# Patient Record
Sex: Female | Born: 1950 | Race: White | Hispanic: No | Smoking: Former smoker
Health system: Southern US, Community
[De-identification: ages and names within clinical notes are randomized; demographics above are authoritative.]

## PROBLEM LIST (undated history)

## (undated) DIAGNOSIS — I4819 Other persistent atrial fibrillation: Secondary | ICD-10-CM

## (undated) DIAGNOSIS — I5042 Chronic combined systolic (congestive) and diastolic (congestive) heart failure: Secondary | ICD-10-CM

## (undated) DIAGNOSIS — I10 Essential (primary) hypertension: Secondary | ICD-10-CM

## (undated) DIAGNOSIS — J449 Chronic obstructive pulmonary disease, unspecified: Secondary | ICD-10-CM

## (undated) DIAGNOSIS — E782 Mixed hyperlipidemia: Secondary | ICD-10-CM

## (undated) DIAGNOSIS — I071 Rheumatic tricuspid insufficiency: Secondary | ICD-10-CM

## (undated) DIAGNOSIS — Z9289 Personal history of other medical treatment: Secondary | ICD-10-CM

## (undated) DIAGNOSIS — I513 Intracardiac thrombosis, not elsewhere classified: Secondary | ICD-10-CM

## (undated) DIAGNOSIS — K219 Gastro-esophageal reflux disease without esophagitis: Secondary | ICD-10-CM

## (undated) DIAGNOSIS — I340 Nonrheumatic mitral (valve) insufficiency: Secondary | ICD-10-CM

## (undated) DIAGNOSIS — Z720 Tobacco use: Secondary | ICD-10-CM

## (undated) HISTORY — DX: Other persistent atrial fibrillation: I48.19

## (undated) HISTORY — DX: Rheumatic tricuspid insufficiency: I07.1

## (undated) HISTORY — DX: Chronic combined systolic (congestive) and diastolic (congestive) heart failure: I50.42

## (undated) HISTORY — DX: Nonrheumatic mitral (valve) insufficiency: I34.0

## (undated) HISTORY — DX: Intracardiac thrombosis, not elsewhere classified: I51.3

## (undated) HISTORY — DX: Personal history of other medical treatment: Z92.89

---

## 2004-05-03 ENCOUNTER — Ambulatory Visit: Payer: Self-pay | Admitting: Family Medicine

## 2006-02-07 ENCOUNTER — Ambulatory Visit: Payer: Self-pay | Admitting: Family Medicine

## 2006-03-10 ENCOUNTER — Ambulatory Visit: Payer: Self-pay | Admitting: Family Medicine

## 2007-10-07 ENCOUNTER — Ambulatory Visit: Payer: Self-pay | Admitting: Family Medicine

## 2008-06-09 ENCOUNTER — Ambulatory Visit: Payer: Self-pay | Admitting: Family Medicine

## 2012-12-28 ENCOUNTER — Ambulatory Visit: Payer: Self-pay | Admitting: Gastroenterology

## 2017-12-15 ENCOUNTER — Other Ambulatory Visit: Payer: Self-pay | Admitting: Family Medicine

## 2017-12-15 DIAGNOSIS — Z139 Encounter for screening, unspecified: Secondary | ICD-10-CM

## 2017-12-22 ENCOUNTER — Other Ambulatory Visit: Payer: Self-pay

## 2020-07-21 ENCOUNTER — Other Ambulatory Visit: Payer: Self-pay | Admitting: Family Medicine

## 2020-07-21 DIAGNOSIS — Z1231 Encounter for screening mammogram for malignant neoplasm of breast: Secondary | ICD-10-CM

## 2020-09-21 ENCOUNTER — Inpatient Hospital Stay
Admission: EM | Admit: 2020-09-21 | Discharge: 2020-09-26 | DRG: 308 | Disposition: A | Discharge status: Home / Self Care | Payer: Medicare HMO | Attending: Internal Medicine | Admitting: Internal Medicine

## 2020-09-21 ENCOUNTER — Emergency Department: Payer: Medicare HMO

## 2020-09-21 ENCOUNTER — Other Ambulatory Visit: Payer: Self-pay

## 2020-09-21 DIAGNOSIS — K219 Gastro-esophageal reflux disease without esophagitis: Secondary | ICD-10-CM | POA: Diagnosis present

## 2020-09-21 DIAGNOSIS — F419 Anxiety disorder, unspecified: Secondary | ICD-10-CM | POA: Diagnosis present

## 2020-09-21 DIAGNOSIS — I361 Nonrheumatic tricuspid (valve) insufficiency: Secondary | ICD-10-CM | POA: Diagnosis not present

## 2020-09-21 DIAGNOSIS — I340 Nonrheumatic mitral (valve) insufficiency: Secondary | ICD-10-CM | POA: Diagnosis not present

## 2020-09-21 DIAGNOSIS — E785 Hyperlipidemia, unspecified: Secondary | ICD-10-CM | POA: Diagnosis not present

## 2020-09-21 DIAGNOSIS — I4819 Other persistent atrial fibrillation: Secondary | ICD-10-CM | POA: Diagnosis not present

## 2020-09-21 DIAGNOSIS — J441 Chronic obstructive pulmonary disease with (acute) exacerbation: Secondary | ICD-10-CM | POA: Diagnosis present

## 2020-09-21 DIAGNOSIS — Z885 Allergy status to narcotic agent status: Secondary | ICD-10-CM | POA: Diagnosis not present

## 2020-09-21 DIAGNOSIS — J9620 Acute and chronic respiratory failure, unspecified whether with hypoxia or hypercapnia: Secondary | ICD-10-CM | POA: Diagnosis present

## 2020-09-21 DIAGNOSIS — I513 Intracardiac thrombosis, not elsewhere classified: Secondary | ICD-10-CM | POA: Diagnosis present

## 2020-09-21 DIAGNOSIS — I5031 Acute diastolic (congestive) heart failure: Secondary | ICD-10-CM | POA: Diagnosis present

## 2020-09-21 DIAGNOSIS — F17210 Nicotine dependence, cigarettes, uncomplicated: Secondary | ICD-10-CM | POA: Diagnosis present

## 2020-09-21 DIAGNOSIS — Z79899 Other long term (current) drug therapy: Secondary | ICD-10-CM

## 2020-09-21 DIAGNOSIS — R531 Weakness: Secondary | ICD-10-CM | POA: Diagnosis present

## 2020-09-21 DIAGNOSIS — F32A Depression, unspecified: Secondary | ICD-10-CM | POA: Diagnosis present

## 2020-09-21 DIAGNOSIS — Z20822 Contact with and (suspected) exposure to covid-19: Secondary | ICD-10-CM | POA: Diagnosis present

## 2020-09-21 DIAGNOSIS — I10 Essential (primary) hypertension: Secondary | ICD-10-CM | POA: Diagnosis not present

## 2020-09-21 DIAGNOSIS — I4891 Unspecified atrial fibrillation: Principal | ICD-10-CM | POA: Diagnosis present

## 2020-09-21 DIAGNOSIS — E782 Mixed hyperlipidemia: Secondary | ICD-10-CM | POA: Diagnosis present

## 2020-09-21 DIAGNOSIS — I110 Hypertensive heart disease with heart failure: Secondary | ICD-10-CM | POA: Diagnosis present

## 2020-09-21 HISTORY — DX: Gastro-esophageal reflux disease without esophagitis: K21.9

## 2020-09-21 HISTORY — DX: Essential (primary) hypertension: I10

## 2020-09-21 HISTORY — DX: Tobacco use: Z72.0

## 2020-09-21 HISTORY — DX: Chronic obstructive pulmonary disease, unspecified: J44.9

## 2020-09-21 HISTORY — DX: Mixed hyperlipidemia: E78.2

## 2020-09-21 LAB — COMPREHENSIVE METABOLIC PANEL
ALT: 43 U/L (ref 0–44)
AST: 58 U/L — ABNORMAL HIGH (ref 15–41)
Albumin: 3.7 g/dL (ref 3.5–5.0)
Alkaline Phosphatase: 74 U/L (ref 38–126)
Anion gap: 7 (ref 5–15)
BUN: 9 mg/dL (ref 8–23)
CO2: 26 mmol/L (ref 22–32)
Calcium: 8.1 mg/dL — ABNORMAL LOW (ref 8.9–10.3)
Chloride: 103 mmol/L (ref 98–111)
Creatinine, Ser: 0.67 mg/dL (ref 0.44–1.00)
GFR, Estimated: >60 mL/min (ref >60)
Glucose, Bld: 147 mg/dL — ABNORMAL HIGH (ref 70–99)
Potassium: 3.7 mmol/L (ref 3.5–5.1)
Sodium: 136 mmol/L (ref 135–145)
Total Bilirubin: 0.9 mg/dL (ref 0.3–1.2)
Total Protein: 6.3 g/dL — ABNORMAL LOW (ref 6.5–8.1)

## 2020-09-21 LAB — CBC
HCT: 38.5 % (ref 36.0–46.0)
Hemoglobin: 13.2 g/dL (ref 12.0–15.0)
MCH: 34.4 pg — ABNORMAL HIGH (ref 26.0–34.0)
MCHC: 34.3 g/dL (ref 30.0–36.0)
MCV: 100.3 fL — ABNORMAL HIGH (ref 80.0–100.0)
Platelets: 197 10*3/uL (ref 150–400)
RBC: 3.84 MIL/uL — ABNORMAL LOW (ref 3.87–5.11)
RDW: 12.4 % (ref 11.5–15.5)
WBC: 5.5 10*3/uL (ref 4.0–10.5)
nRBC: 0 % (ref 0.0–0.2)

## 2020-09-21 LAB — RESP PANEL BY RT-PCR (FLU A&B, COVID) ARPGX2
Influenza A by PCR: NEGATIVE
Influenza B by PCR: NEGATIVE
SARS Coronavirus 2 by RT PCR: NEGATIVE

## 2020-09-21 LAB — TROPONIN I (HIGH SENSITIVITY)
Troponin I (High Sensitivity): 11 ng/L (ref <18)
Troponin I (High Sensitivity): 12 ng/L (ref <18)

## 2020-09-21 LAB — BRAIN NATRIURETIC PEPTIDE: B Natriuretic Peptide: 547.2 pg/mL — ABNORMAL HIGH (ref 0.0–100.0)

## 2020-09-21 LAB — MAGNESIUM: Magnesium: 1.9 mg/dL (ref 1.7–2.4)

## 2020-09-21 MED ORDER — DILTIAZEM HCL-DEXTROSE 125-5 MG/125ML-% IV SOLN (PREMIX)
5.0000 mg/h | INTRAVENOUS | Status: DC
  Administered 2020-09-21: 5 mg/h via INTRAVENOUS
  Filled 2020-09-21: qty 125

## 2020-09-21 MED ORDER — METHYLPREDNISOLONE SODIUM SUCC 40 MG IJ SOLR
40.0000 mg | Freq: Two times a day (BID) | INTRAMUSCULAR | Status: DC
  Administered 2020-09-22 – 2020-09-25 (×7): 40 mg via INTRAVENOUS
  Filled 2020-09-21 (×7): qty 1

## 2020-09-21 MED ORDER — METHYLPREDNISOLONE SODIUM SUCC 125 MG IJ SOLR
125.0000 mg | Freq: Once | INTRAMUSCULAR | Status: AC
  Administered 2020-09-21: 125 mg via INTRAVENOUS
  Filled 2020-09-21: qty 2

## 2020-09-21 MED ORDER — APIXABAN 5 MG PO TABS
5.0000 mg | ORAL_TABLET | Freq: Two times a day (BID) | ORAL | Status: DC
  Administered 2020-09-21: 5 mg via ORAL
  Filled 2020-09-21: qty 1

## 2020-09-21 MED ORDER — FLUTICASONE FUROATE-VILANTEROL 200-25 MCG/INH IN AEPB
1.0000 | INHALATION_SPRAY | Freq: Every day | RESPIRATORY_TRACT | Status: DC
  Administered 2020-09-22 – 2020-09-26 (×5): 1 via RESPIRATORY_TRACT
  Filled 2020-09-21 (×2): qty 28

## 2020-09-21 MED ORDER — DILTIAZEM HCL 25 MG/5ML IV SOLN
10.0000 mg | Freq: Once | INTRAVENOUS | Status: AC
  Administered 2020-09-21: 10 mg via INTRAVENOUS
  Filled 2020-09-21: qty 5

## 2020-09-21 MED ORDER — VITAMIN E 180 MG (400 UNIT) PO CAPS
400.0000 [IU] | ORAL_CAPSULE | Freq: Two times a day (BID) | ORAL | Status: DC
  Administered 2020-09-22 – 2020-09-26 (×10): 400 [IU] via ORAL
  Filled 2020-09-21: qty 1
  Filled 2020-09-21: qty 4
  Filled 2020-09-21 (×7): qty 1
  Filled 2020-09-21: qty 4
  Filled 2020-09-21 (×2): qty 1

## 2020-09-21 MED ORDER — IPRATROPIUM-ALBUTEROL 0.5-2.5 (3) MG/3ML IN SOLN
3.0000 mL | Freq: Once | RESPIRATORY_TRACT | Status: AC
  Administered 2020-09-21: 3 mL via RESPIRATORY_TRACT
  Filled 2020-09-21: qty 3

## 2020-09-21 MED ORDER — HEPARIN BOLUS VIA INFUSION
2600.0000 [IU] | Freq: Once | INTRAVENOUS | Status: AC
  Administered 2020-09-22: 2600 [IU] via INTRAVENOUS
  Filled 2020-09-21: qty 2600

## 2020-09-21 MED ORDER — PANTOPRAZOLE SODIUM 40 MG PO TBEC
40.0000 mg | DELAYED_RELEASE_TABLET | Freq: Every day | ORAL | Status: DC
  Administered 2020-09-22 – 2020-09-26 (×5): 40 mg via ORAL
  Filled 2020-09-21 (×5): qty 1

## 2020-09-21 MED ORDER — ATORVASTATIN CALCIUM 20 MG PO TABS
20.0000 mg | ORAL_TABLET | Freq: Every day | ORAL | Status: DC
  Administered 2020-09-22 – 2020-09-25 (×5): 20 mg via ORAL
  Filled 2020-09-21 (×6): qty 1

## 2020-09-21 MED ORDER — HEPARIN (PORCINE) 25000 UT/250ML-% IV SOLN
850.0000 [IU]/h | INTRAVENOUS | Status: DC
  Administered 2020-09-22: 750 [IU]/h via INTRAVENOUS
  Filled 2020-09-21: qty 250

## 2020-09-21 MED ORDER — FUROSEMIDE 10 MG/ML IJ SOLN
40.0000 mg | Freq: Once | INTRAMUSCULAR | Status: AC
  Administered 2020-09-21: 40 mg via INTRAVENOUS
  Filled 2020-09-21: qty 4

## 2020-09-21 MED ORDER — ASCORBIC ACID 500 MG PO TABS
1000.0000 mg | ORAL_TABLET | Freq: Two times a day (BID) | ORAL | Status: DC
  Administered 2020-09-22 – 2020-09-26 (×10): 1000 mg via ORAL
  Filled 2020-09-21 (×10): qty 2

## 2020-09-21 MED ORDER — ADULT MULTIVITAMIN W/MINERALS CH
1.0000 | ORAL_TABLET | Freq: Every day | ORAL | Status: DC
  Administered 2020-09-22 – 2020-09-26 (×5): 1 via ORAL
  Filled 2020-09-21 (×5): qty 1

## 2020-09-21 MED ORDER — ACETAMINOPHEN 325 MG PO TABS
650.0000 mg | ORAL_TABLET | ORAL | Status: DC | PRN
  Administered 2020-09-22: 650 mg via ORAL
  Filled 2020-09-21: qty 2

## 2020-09-21 MED ORDER — FLUOXETINE HCL 20 MG PO CAPS
40.0000 mg | ORAL_CAPSULE | Freq: Every day | ORAL | Status: DC
  Administered 2020-09-22 – 2020-09-26 (×5): 40 mg via ORAL
  Filled 2020-09-21 (×5): qty 2

## 2020-09-21 MED ORDER — AMIODARONE HCL IN DEXTROSE 360-4.14 MG/200ML-% IV SOLN
60.0000 mg/h | INTRAVENOUS | Status: DC
Start: 2020-09-21 — End: 2020-09-22
  Administered 2020-09-22 (×2): 60 mg/h via INTRAVENOUS
  Filled 2020-09-21 (×2): qty 200

## 2020-09-21 MED ORDER — SODIUM CHLORIDE 0.9 % IV SOLN
500.0000 mg | Freq: Every day | INTRAVENOUS | Status: AC
  Administered 2020-09-21 – 2020-09-23 (×3): 500 mg via INTRAVENOUS
  Filled 2020-09-21 (×3): qty 500

## 2020-09-21 MED ORDER — AMIODARONE LOAD VIA INFUSION
150.0000 mg | Freq: Once | INTRAVENOUS | Status: AC
  Administered 2020-09-22: 150 mg via INTRAVENOUS
  Filled 2020-09-21: qty 83.34

## 2020-09-21 MED ORDER — IPRATROPIUM BROMIDE 0.02 % IN SOLN: 0.5000 mg | RESPIRATORY_TRACT | Status: DC | PRN

## 2020-09-21 MED ORDER — IPRATROPIUM BROMIDE 0.02 % IN SOLN
0.5000 mg | Freq: Four times a day (QID) | RESPIRATORY_TRACT | Status: DC
  Administered 2020-09-21 – 2020-09-25 (×15): 0.5 mg via RESPIRATORY_TRACT
  Filled 2020-09-21 (×15): qty 2.5

## 2020-09-21 MED ORDER — AMIODARONE HCL IN DEXTROSE 360-4.14 MG/200ML-% IV SOLN
30.0000 mg/h | INTRAVENOUS | Status: DC
Start: 2020-09-22 — End: 2020-09-25
  Administered 2020-09-22 – 2020-09-24 (×6): 30 mg/h via INTRAVENOUS
  Filled 2020-09-21 (×7): qty 200

## 2020-09-21 MED ORDER — ONDANSETRON HCL 4 MG/2ML IJ SOLN: 4.0000 mg | Freq: Four times a day (QID) | INTRAMUSCULAR | Status: DC | PRN

## 2020-09-21 NOTE — Discharge Instructions (Signed)
Information on my medicine - ELIQUIS (apixaban) This medication education was reviewed with me or my healthcare representative as part of my discharge preparation. The pharmacist that spoke with me during my hospital stay was: ____________________________ (pharmacist name) WHY WAS ELIQUIS PRESCRIBED FOR YOU? Eliquis was prescribed for you to reduce the risk of a blood clot forming that can cause a stroke if you have a medical condition called atrial fibrillation (a type of irregular heartbeat). WHAT DO YOU NEED TO KNOW ABOUT ELIQUIS ? Take your Eliquis TWICE DAILY - one tablet in the morning and one tablet in the evening with or without food. If you have difficulty swallowing the tablet whole please discuss with your pharmacist how to take the medication safely. Take Eliquis exactly as prescribed by your doctor and DO NOT stop taking Eliquis without talking to the doctor who prescribed the medication. Stopping may increase your risk of developing a stroke. Refill your prescription before you run out. After discharge, you should have regular check-up appointments with your healthcare provider that is prescribing your Eliquis. In the future your dose may need to be changed if your kidney function or weight changes by a significant amount or as you get older. WHAT DO YOU DO IF YOU MISS A DOSE? If you miss a dose, take it as soon as you remember on the same day and resume taking twice daily. Do not take more than one dose of ELIQUIS at the same time to make up a missed dose. IMPORTANT SAFETY INFORMATION A possible side effect of Eliquis is bleeding. You should call your healthcare provider right away if you experience any of the following: ? Bleeding from an injury or your nose that does not stop. ? Unusual colored urine (red or dark brown) or unusual colored stools (red or black). ? Unusual bruising for unknown reasons. ? A serious fall or if you hit your head (even if there is no  bleeding). Some medicines may interact with Eliquis and might increase your risk of bleeding or clotting while on Eliquis. To help avoid this, consult your healthcare provider or pharmacist prior to using any new prescription or non-prescription medications, including herbals, vitamins, non-steroidal anti-inflammatory drugs (NSAIDs) and supplements. This website has more information on Eliquis (apixaban): www.Eliquis.com. 

## 2020-09-21 NOTE — ED Triage Notes (Signed)
Pt in with co [facility] for a week, when EMS arrived she was HR 208, given Adenocard 6 mg with 12 mg without improvement. Given metoprolol 2.5 and now AFib heart rate 150's.

## 2020-09-21 NOTE — H&P (Signed)
History and Physical    Angela Singh XNA:[id number] DOB: 04/20/1950 DOA: 09/21/2020  PCP: Oswaldo Conroy, MD  Chief Complaint: Shortness of breath  HPI: Angela Singh is a 70 y.o. 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  The patient presents to the emergency department due to shortness of breath for the past 1 week that has been getting worse.  Associated wheezing and purulent cough with white sputum.  Endorses some dyspnea on exertion and some orthopnea.  Today her heart started racing therefore she called EMS.  No known history of CHF.  Upon EMS arrival her heart rate was in the 200s.  There was concern for SVT and the patient was given adenosine and metoprolol.  Heart rate did improve.  EKG in the emergency department showed A. fib with RVR.  The patient was given a diltiazem bolus and then started on a diltiazem drip.  On my exam her heart rate is still in the 130s to 150s.  Maxed out on the diltiazem drip.  Denies any chest pain.  Denies any previous history of A. fib.  ED Course: 640 mg IV x1, DuoNebs 3 mL x 1, Solu-Medrol 125 mg IV x1, diltiazem 10 mg IV x1, diltiazem drip.  EKG, COVID 19 PCR, flu A/B PCR, CBC, magnesium, CMP, troponin, BNP.  Review of Systems: 14 point review of systems is negative except for what is mentioned above in the HPI.   No past medical history on file.    Social History   Socioeconomic History   Marital status: Divorced    Spouse name: Not on file   Number of children: Not on file   Years of education: Not on file   Highest education level: Not on file  Occupational History   Not on file  Tobacco Use   Smoking status: Not on file   Smokeless tobacco: Not on file  Substance and Sexual Activity   Alcohol use: Not on file   Drug use: Not on file   Sexual activity: Not on file  Other Topics Concern   Not on file  Social History Narrative   Not  on file   Social Determinants of Health   Financial Resource Strain: Not on file  Food Insecurity: Not on file  Transportation Needs: Not on file  Physical Activity: Not on file  Stress: Not on file  Social Connections: Not on file  Intimate Partner Violence: Not on file    Allergies  Allergen Reactions   Codeine Other (See Comments)    weakness    No family history on file.  Prior to Admission medications   Not on File    Physical Exam: Vitals:   09/21/20 2042 09/21/20 2100 09/21/20 2140 09/21/20 2202  BP:  (!) 110/58 107/76   Pulse:  (!) 128 (!) 113   Resp:  (!) 27 18   Temp:    98.9 F (37.2 C)  TempSrc:    Oral  SpO2:  96% 97%   Weight: 52.2 kg     Height: 5\' 4"  (1.626 m)        General:  Appears calm and comfortable and is in NAD Cardiovascular: Irregular rhythm and irregular rate. Respiratory: Diffuse expiratory wheeze Abdomen:  soft, NT, ND, NABS Skin:  no rash or induration seen on limited exam Musculoskeletal:  grossly normal tone BUE/BLE, good ROM, no bony abnormality Lower extremity:  No LE edema.  Limited foot exam with no ulcerations.  2+ distal pulses. Psychiatric:  grossly normal mood and affect, speech fluent and appropriate, AOx3 Neurologic:  CN 2-12 grossly intact, moves all extremities in coordinated fashion, sensation intact    Radiological Exams on Admission: Independently reviewed - see discussion in A/P where applicable  DG Chest 2 View  Result Date: 09/21/2020 CLINICAL DATA:  Chest pain and shortness of breath. New onset atrial fibrillation and RVR. EXAM: CHEST - 2 VIEW COMPARISON:  Chest x-ray 02/07/2006 FINDINGS: The heart and mediastinal contours are unchanged. Aortic calcification. No focal consolidation. No pulmonary edema. Increased and coarsened interstitial markings. Bilateral trace pleural effusion. No pneumothorax. No acute osseous abnormality. IMPRESSION: Mild pulmonary edema with trace bilateral pleural effusions.  Electronically Signed   By: Tish Frederickson M.D.   On: 09/21/2020 21:35    EKG: Independently reviewed.  Atrial fibrillation, rate 151.   Labs on Admission: I have personally reviewed the available labs and imaging studies at the time of the admission.  Pertinent labs: BNP 547 blood glucose 147, AST 58, troponin 11.     Assessment/Plan: New onset atrial fibrillation with rapid ventricular response: The patient will be admitted to the stepdown unit under inpatient status with cardiac monitoring.  Currently on diltiazem drip and maxed out on the drip.  Has not converted to sinus rhythm.  Remains in RVR. Start heparin drip atrial fib dosing for stroke prophylaxis.  We will obtain echocardiogram to rule out valvular abnormalities.  Cardiology consulted and recommended amiodarone bolus and then drip.  Electrolytes are reassuring.  We will obtain a TSH/free T4.  Troponins are reassuring.  Acute dyspnea secondary to COPD exacerbation and suspected mild CHF decompensation: To pathology appears to be primarily COPD related as she is wheezing on exam.  May have some imposed mild pulmonary edema.  Has been given 640 mg IV x1 in the emergency department.  We will hold off on any further IV diuresis.  Start DuoNeb scheduled and as needed.  Continue IV steroids.  ICS, flutter valve, chest PT.  Start Zithromax.  Currently on room air and saturating well.  Continue home Symbicort.  We will obtain echocardiogram to assess LVEF.  Hypertension: Patient's home HCTZ 12.5 mg daily due to low normal blood pressures.  Tobacco dependence: Counseled on smoking cessation.  Hyperlipidemia: Continue home atorvastatin 20 mg daily.  Depression/anxiety: Continue home Prozac 40 mg daily.  GERD: Continue home Protonix 40 mg daily.   Level of Care: Stepdown DVT prophylaxis: Heparin drip Code Status: Full code Consults: Cardiology Admission status: Inpatient   Verdia Kuba DO [facility]   How to contact  the [facility] Attending or Consulting provider 7A - 7P or covering provider during after hours 7P -7A, for this patient?  Check the care team in [facility] and look for a) attending/consulting [facility] provider listed and b) the [facility] team listed Log into [email or web address] and use [place]'s universal password to access. If you do not have the password, please contact the hospital operator. Locate the [facility] provider you are looking for under [facility] and page to a number that you can be directly reached. If you still have difficulty reaching the provider, please page the [facility] (Director on Call) for the Hospitalists listed on amion for assistance.   09/21/2020, 10:13 PM

## 2020-09-21 NOTE — ED Provider Notes (Signed)
[facility] Emergency Department Provider Note ____________________________________________   Event Date/Time   First MD Initiated Contact with Patient 09/21/20 2040     (approximate)  I have reviewed the triage vital signs and the nursing notes.  HISTORY  Chief Complaint Shortness of Breath   HPI Angela Singh is a 70 y.o. femalewho presents to the ED for evaluation of shortness of breath and tachycardia.  Chart review indicates no relevant history within our system. Patient with a history of hypertension, COPD and GERD.  She has no idea of the medications that she is on.  She presents today for evaluation of worsening intermittent shortness of breath with exertion over the past week.  She reports increased cough without increased sputum production.  She denies any fevers.  She reports orthopnea and dyspnea on exertion.  She denies chest pain, syncope or palpitations.  Denies pain.  This has never happened before.  No past medical history on file.  There are no problems to display for this patient.    Prior to Admission medications   Not on File    Allergies Codeine  No family history on file.  Social History    Review of Systems  Constitutional: No fever/chills Eyes: No visual changes. ENT: No sore throat. Cardiovascular: Denies chest pain. Respiratory: Positive for shortness of breath. Gastrointestinal: No abdominal pain.  No nausea, no vomiting.  No diarrhea.  No constipation. Genitourinary: Negative for dysuria. Musculoskeletal: Negative for back pain. Skin: Negative for rash. Neurological: Negative for headaches, focal weakness or numbness.  ____________________________________________   PHYSICAL EXAM:  VITAL SIGNS: Vitals:   09/21/20 2100 09/21/20 2140  BP: (!) 110/58 107/76  Pulse: (!) 128 (!) 113  Resp: (!) 27 18  SpO2: 96% 97%     Constitutional: Alert and oriented. Well appearing and in no acute distress.  Pink  puffer morphology. Eyes: Conjunctivae are normal. PERRL. EOMI. Head: Atraumatic. Nose: No congestion/rhinnorhea. Mouth/Throat: Mucous membranes are dry.  Oropharynx non-erythematous. Neck: No stridor. No cervical spine tenderness to palpation. Cardiovascular: Tachycardic and irregular rhythm. Grossly normal heart sounds.  Good peripheral circulation. Respiratory: Slight tachypnea to the mid 20s, no further evidence of distress.  Bibasilar crackles are present as well as diffuse expiratory wheezes with slight decrease in air movement throughout. Gastrointestinal: Soft , nondistended, nontender to palpation. No CVA tenderness. Musculoskeletal: No lower extremity tenderness nor edema.  No joint effusions. No signs of acute trauma. Neurologic:  Normal speech and language. No gross focal neurologic deficits are appreciated. No gait instability noted. Skin:  Skin is warm, dry and intact. No rash noted. Psychiatric: Mood and affect are normal. Speech and behavior are normal.  ____________________________________________   LABS (all labs ordered are listed, but only abnormal results are displayed)  Labs Reviewed  CBC - Abnormal; Notable for the following components:      Result Value   RBC 3.84 (*)    MCV 100.3 (*)    MCH 34.4 (*)    All other components within normal limits  COMPREHENSIVE METABOLIC PANEL - Abnormal; Notable for the following components:   Glucose, Bld 147 (*)    Calcium 8.1 (*)    Total Protein 6.3 (*)    AST 58 (*)    All other components within normal limits  BRAIN NATRIURETIC PEPTIDE - Abnormal; Notable for the following components:   B Natriuretic Peptide 547.2 (*)    All other components within normal limits  RESP PANEL BY RT-PCR (FLU A&B, COVID)  ARPGX2  MAGNESIUM  TROPONIN I (HIGH SENSITIVITY)   ____________________________________________  12 Lead EKG  A. fib with RVR, rate of 151 bpm.  Normal axis and intervals.  No  STEMI. ____________________________________________  RADIOLOGY  ED MD interpretation: 2 view CXR reviewed by me with pulmonary vascular congestion without discrete lobar infiltration  Official radiology report(s): DG Chest 2 View  Result Date: 09/21/2020 CLINICAL DATA:  Chest pain and shortness of breath. New onset atrial fibrillation and RVR. EXAM: CHEST - 2 VIEW COMPARISON:  Chest x-ray 02/07/2006 FINDINGS: The heart and mediastinal contours are unchanged. Aortic calcification. No focal consolidation. No pulmonary edema. Increased and coarsened interstitial markings. Bilateral trace pleural effusion. No pneumothorax. No acute osseous abnormality. IMPRESSION: Mild pulmonary edema with trace bilateral pleural effusions. Electronically Signed   By: Tish Frederickson M.D.   On: 09/21/2020 21:35    ____________________________________________   PROCEDURES and INTERVENTIONS  Procedure(s) performed (including Critical Care):  .1-3 Lead EKG Interpretation  Date/Time: 09/21/2020 10:02 PM Performed by: Delton Prairie, MD Authorized by: Delton Prairie, MD     Interpretation: abnormal     ECG rate:  138   ECG rate assessment: tachycardic     Rhythm: atrial fibrillation     Ectopy: none     Conduction: normal   .Critical Care  Date/Time: 09/21/2020 10:02 PM Performed by: Delton Prairie, MD Authorized by: Delton Prairie, MD   Critical care provider statement:    Critical care time (minutes):  45   Critical care was necessary to treat or prevent imminent or life-threatening deterioration of the following conditions:  Cardiac failure and circulatory failure   Critical care was time spent personally by me on the following activities:  Discussions with consultants, evaluation of patient's response to treatment, examination of patient, ordering and performing treatments and interventions, ordering and review of laboratory studies, ordering and review of radiographic studies, pulse oximetry, re-evaluation  of patient's condition, obtaining history from patient or surrogate and review of old charts  Medications  diltiazem (CARDIZEM) 125 mg in dextrose 5% 125 mL (1 mg/mL) infusion (15 mg/hr Intravenous Rate/Dose Change 09/21/20 2150)  furosemide (LASIX) injection 40 mg (has no administration in time range)  methylPREDNISolone sodium succinate (SOLU-MEDROL) 125 mg/2 mL injection 125 mg (has no administration in time range)  ipratropium-albuterol (DUONEB) 0.5-2.5 (3) MG/3ML nebulizer solution 3 mL (has no administration in time range)  diltiazem (CARDIZEM) injection 10 mg (10 mg Intravenous Given 09/21/20 2056)    ____________________________________________   MDM / ED COURSE   70 year old woman with COPD and continued smoking presents to the ED with new onset atrial fibrillation, and RVR requiring diltiazem drip, as well as stigmata of COPD exacerbation and volume overload requiring medical admission.  She is tachycardic in A. fib with RVR, but hemodynamically stable.  Exam with stigmata of COPD exacerbation as well as faint bibasilar inspiratory crackles that I have suspicion of present volume overload in the setting of acute A. fib with RVR.  Her BNP is elevated and CXR is congested, so Lasix was provided.  No evidence of CAP or infectious pathology.  We will provide steroids and breathing treatments for her COPD.  Diltiazem bolus of 10 mg due to marginal blood pressures is well-tolerated and a drip was started thereafter.  We will discussed with medicine for admission.      ____________________________________________   FINAL CLINICAL IMPRESSION(S) / ED DIAGNOSES  Final diagnoses:  New onset atrial fibrillation (HCC)  Atrial fibrillation with RVR (HCC)  COPD exacerbation (HCC)  ED Discharge Orders     None        Jerod Mcquain Katrinka Blazing   Note:  This document was prepared using Dragon voice recognition software and may include unintentional dictation errors.    Delton Prairie,  MD 09/21/20 2204

## 2020-09-21 NOTE — Consult Note (Signed)
ANTICOAGULATION CONSULT NOTE  Pharmacy Consult for IV Heparin Indication: atrial fibrillation  Patient Measurements: Heparin Dosing Weight: 52.2 kg  Labs: Recent Labs    09/21/20 2055  HGB 13.2  HCT 38.5  PLT 197  CREATININE 0.67  TROPONINIHS 11    Estimated Creatinine Clearance: 55.5 mL/min (by C-G formula based on SCr of 0.67 mg/dL).   Medications:  Apixaban 5 mg given in ED 8/18 at 2242. This medication was not on home medication list  Assessment: Patient is a 70 y/o F with medical history including HTN, COPD, GERD who presented to the ED with shortness of breath and was subsequently found to have new-onset Afib with RVR. Patient was started on Eliquis and received one dose. Pharmacy now consulted to initiate heparin infusion for Afib.  Baseline CBC acceptable. Baseline aPTT, PT-INR, and heparin level ordered for tomorrow AM.   Goal of Therapy:  Heparin level 0.3-0.7 units/ml aPTT 66 - 102 seconds Monitor platelets by anticoagulation protocol: Yes   Plan:  --Heparin 2600 IV bolus followed by continuous infusion at 750 units/hr to start 8/19 at 1000 --Will check aPTT 6 hours after initiation of infusion. Plan to follow aPTT given presumed interference of apixaban on heparin level. Plan to transition to heparin level monitoring once correlation is established or if baseline heparin level is low --Daily CBC per protocol while on IV heparin  Tressie Ellis 09/21/2020,10:55 PM

## 2020-09-22 ENCOUNTER — Encounter: Payer: Self-pay | Admitting: Nurse Practitioner

## 2020-09-22 ENCOUNTER — Encounter: Payer: Self-pay | Admitting: Family Medicine

## 2020-09-22 ENCOUNTER — Inpatient Hospital Stay (HOSPITAL_COMMUNITY)
Admit: 2020-09-22 | Discharge: 2020-09-22 | Disposition: A | Discharge status: Home / Self Care | Payer: Medicare HMO | Attending: Cardiovascular Disease | Admitting: Cardiovascular Disease

## 2020-09-22 DIAGNOSIS — J449 Chronic obstructive pulmonary disease, unspecified: Secondary | ICD-10-CM | POA: Insufficient documentation

## 2020-09-22 DIAGNOSIS — I4891 Unspecified atrial fibrillation: Principal | ICD-10-CM

## 2020-09-22 DIAGNOSIS — K219 Gastro-esophageal reflux disease without esophagitis: Secondary | ICD-10-CM | POA: Insufficient documentation

## 2020-09-22 DIAGNOSIS — I10 Essential (primary) hypertension: Secondary | ICD-10-CM | POA: Diagnosis not present

## 2020-09-22 DIAGNOSIS — J441 Chronic obstructive pulmonary disease with (acute) exacerbation: Secondary | ICD-10-CM

## 2020-09-22 DIAGNOSIS — Z720 Tobacco use: Secondary | ICD-10-CM | POA: Insufficient documentation

## 2020-09-22 DIAGNOSIS — E785 Hyperlipidemia, unspecified: Secondary | ICD-10-CM | POA: Insufficient documentation

## 2020-09-22 DIAGNOSIS — E782 Mixed hyperlipidemia: Secondary | ICD-10-CM | POA: Insufficient documentation

## 2020-09-22 LAB — CBC
HCT: 39.1 % (ref 36.0–46.0)
Hemoglobin: 13.2 g/dL (ref 12.0–15.0)
MCH: 33.4 pg (ref 26.0–34.0)
MCHC: 33.8 g/dL (ref 30.0–36.0)
MCV: 99 fL (ref 80.0–100.0)
Platelets: 185 10*3/uL (ref 150–400)
RBC: 3.95 MIL/uL (ref 3.87–5.11)
RDW: 12.4 % (ref 11.5–15.5)
WBC: 4 10*3/uL (ref 4.0–10.5)
nRBC: 0 % (ref 0.0–0.2)

## 2020-09-22 LAB — BASIC METABOLIC PANEL
Anion gap: 6 (ref 5–15)
BUN: 13 mg/dL (ref 8–23)
CO2: 27 mmol/L (ref 22–32)
Calcium: 8.4 mg/dL — ABNORMAL LOW (ref 8.9–10.3)
Chloride: 102 mmol/L (ref 98–111)
Creatinine, Ser: 0.72 mg/dL (ref 0.44–1.00)
GFR, Estimated: >60 mL/min (ref >60)
Glucose, Bld: 154 mg/dL — ABNORMAL HIGH (ref 70–99)
Potassium: 3.9 mmol/L (ref 3.5–5.1)
Sodium: 135 mmol/L (ref 135–145)

## 2020-09-22 LAB — LIPID PANEL
Cholesterol: 153 mg/dL (ref 0–200)
HDL: 73 mg/dL (ref >40)
LDL Cholesterol: 73 mg/dL (ref 0–99)
Total CHOL/HDL Ratio: 2.1 RATIO
Triglycerides: 33 mg/dL (ref <150)
VLDL: 7 mg/dL (ref 0–40)

## 2020-09-22 LAB — PROTIME-INR
INR: 1.3 — ABNORMAL HIGH (ref 0.8–1.2)
Prothrombin Time: 15.9 seconds — ABNORMAL HIGH (ref 11.4–15.2)

## 2020-09-22 LAB — VITAMIN D 25 HYDROXY (VIT D DEFICIENCY, FRACTURES): Vit D, 25-Hydroxy: 46.46 ng/mL (ref 30–100)

## 2020-09-22 LAB — HEMOGLOBIN A1C
Hgb A1c MFr Bld: 5.9 % — ABNORMAL HIGH (ref 4.8–5.6)
Mean Plasma Glucose: 122.63 mg/dL

## 2020-09-22 LAB — CBG MONITORING, ED
Glucose-Capillary: 155 mg/dL — ABNORMAL HIGH (ref 70–99)
Glucose-Capillary: 204 mg/dL — ABNORMAL HIGH (ref 70–99)
Glucose-Capillary: 128 mg/dL — ABNORMAL HIGH (ref 70–99)
Glucose-Capillary: 159 mg/dL — ABNORMAL HIGH (ref 70–99)

## 2020-09-22 LAB — HIV ANTIBODY (ROUTINE TESTING W REFLEX): HIV Screen 4th Generation wRfx: NONREACTIVE

## 2020-09-22 LAB — VITAMIN B12: Vitamin B-12: 825 pg/mL (ref 180–914)

## 2020-09-22 LAB — APTT
aPTT: 30 seconds (ref 24–36)
aPTT: 72 seconds — ABNORMAL HIGH (ref 24–36)
aPTT: 63 seconds — ABNORMAL HIGH (ref 24–36)

## 2020-09-22 LAB — HEPARIN LEVEL (UNFRACTIONATED): Heparin Unfractionated: >1.1 IU/mL — ABNORMAL HIGH (ref 0.30–0.70)

## 2020-09-22 LAB — FOLATE: Folate: 28 ng/mL (ref >5.9)

## 2020-09-22 LAB — T4, FREE: Free T4: 0.95 ng/dL (ref 0.61–1.12)

## 2020-09-22 MED ORDER — DILTIAZEM HCL 60 MG PO TABS
30.0000 mg | ORAL_TABLET | Freq: Four times a day (QID) | ORAL | Status: DC
  Administered 2020-09-22 – 2020-09-23 (×4): 30 mg via ORAL
  Filled 2020-09-22 (×5): qty 1

## 2020-09-22 MED ORDER — DILTIAZEM HCL 60 MG PO TABS
30.0000 mg | ORAL_TABLET | Freq: Once | ORAL | Status: AC
  Administered 2020-09-22: 30 mg via ORAL
  Filled 2020-09-22: qty 1

## 2020-09-22 NOTE — ED Notes (Signed)
Received report from Miah, RN who states pt's HR is fluctating between 100-120bpm, but blood pressure has been soft, so she did not titrate amio up any further. Will continue to monitor.

## 2020-09-22 NOTE — ED Notes (Signed)
Care transferred, report received from Kelly, RN 

## 2020-09-22 NOTE — Consult Note (Signed)
ANTICOAGULATION CONSULT NOTE  Pharmacy Consult for IV Heparin Indication: atrial fibrillation  Patient Measurements: Heparin Dosing Weight: 52.2 kg  Labs: Recent Labs    09/21/20 2055 09/21/20 2243 09/22/20 0543 09/22/20 1601  HGB 13.2  --  13.2  --   HCT 38.5  --  39.1  --   PLT 197  --  185  --   APTT  --   --  30 72*  LABPROT  --   --  15.9*  --   INR  --   --  1.3*  --   HEPARINUNFRC  --   --  >1.10*  --   CREATININE 0.67  --  0.72  --   TROPONINIHS 11 12  --   --      Estimated Creatinine Clearance: 55.5 mL/min (by C-G formula based on SCr of 0.72 mg/dL).   Medications:  Apixaban 5 mg given in ED 8/18 at 2242. This medication was not on home medication list  Assessment: Patient is a 69 y/o F with medical history including HTN, COPD, GERD who presented to the ED with shortness of breath and was subsequently found to have new-onset Afib with RVR. Patient was started on Eliquis and received one dose. Pharmacy now consulted to initiate heparin infusion for Afib.  Baseline CBC acceptable. Baseline aPTT, PT-INR, and heparin level ordered for tomorrow AM.   Goal of Therapy:  Heparin level 0.3-0.7 units/ml aPTT 66 - 102 seconds Monitor platelets by anticoagulation protocol: Yes   Plan:  8/19 16:01 aPTT 72 is therapeutic x 1. --Continue Heparin infusion at 750 units/hr  --Will check aPTT in 6 hours  for confirmation. Plan to follow aPTT given interference of apixaban on heparin level. Plan to transition to heparin level monitoring once correlation is established or if baseline heparin level is low --Daily CBC per protocol while on IV heparin  Clovia Cuff, PharmD, BCPS 09/22/2020 4:54 PM

## 2020-09-22 NOTE — Progress Notes (Signed)
[facility] Progress Note  Patient: Angela Singh    VEL:[id number]  DOA: 09/21/2020     Date of Service: the patient was seen and examined on 09/22/2020  Chief Complaint  Patient presents with   Shortness of Breath   Brief hospital course: Angela Singh is a 70 y.o. 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   The patient presents to the emergency department due to shortness of breath for the past 1 week that has been getting worse.  Associated wheezing and purulent cough with white sputum.  Endorses some dyspnea on exertion and some orthopnea.  Today her heart started racing therefore she called EMS.  No known history of CHF.   Upon EMS arrival her heart rate was in the 200s.  There was concern for SVT and the patient was given adenosine and metoprolol.  Heart rate did improve.  EKG in the emergency department showed A. fib with RVR.  The patient was given a diltiazem bolus and then started on a diltiazem drip.   On my exam her heart rate is still in the 130s to 150s.  Maxed out on the diltiazem drip.  Denies any chest pain.  Denies any previous history of A. fib.  ED Course: 640 mg IV x1, DuoNebs 3 mL x 1, Solu-Medrol 125 mg IV x1, diltiazem 10 mg IV x1, diltiazem drip.  EKG, COVID 19 PCR, flu A/B PCR, CBC, magnesium, CMP, troponin, BNP.   Assessment and Plan:  # New onset atrial fibrillation with rapid ventricular response S/p Cardizem infusion Cardiology consulted, patient was started on amiodarone infusion, Cardizem 30 mg p.o. every 6 hourly, digoxin 0.125 IV, may repeat dose in the afternoon Continued beta-blocker Started heparin IV infusion, will transition to Eliquis after 2D echocardiogram Patient may need transesophageal echo with cardioversion early next week, patient preferred medical management in an effort to avoid procedures   # Acute dyspnea secondary to COPD exacerbation and  suspected mild CHF decompensation Continue Breo Ellipta inhaler daily, ipratropium nebulizer every 6 hourly Solu-Medrol 40 every 12 hourly We will transition to oral steroids after improvement Empiric antibiotic azithromycin 500 daily for 3 days  # Hypertension, hyperlipidemia Continue statin Held HCTZ, Cardizem started   # Tobacco dependence: Counseled on smoking cessation. # Depression/anxiety: Continue home Prozac 40 mg daily. # GERD: Continue home Protonix 40 mg daily    Body mass index is 19.74 kg/m.  Interventions:       Diet: Heart healthy DVT Prophylaxis: Therapeutic Anticoagulation with heparin IV infusion    Advance goals of care discussion: Full code  Family Communication: family was NOT present at bedside, at the time of interview.  The pt provided permission to discuss medical plan with the family. Opportunity was given to ask question and all questions were answered satisfactorily.   Disposition:  Pt is from Home, admitted with A. Fib w/rvr, still has A. fib, which precludes a safe discharge. Discharge to Home, when A. fib will be controlled and cleared by cardiology, most likely in 1 to 2 days.  Subjective: No significant overnight events, patient was admitted due to A. fib with RVR and COPD, patient feels improvement in the shortness of breath, denies any chest pain or palpitations at this time.  Physical Exam: General:  alert oriented to time, place, and person.  Appear in mild distress, affect appropriate Eyes: PERRLA ENT: Oral Mucosa Clear, moist  Neck: no JVD,  Cardiovascular: S1 and S2 Present, no Murmur,  Respiratory: good respiratory effort, Bilateral Air entry equal and Decreased, no Crackles, mild wheezes Abdomen: Bowel Sound present, Soft and no tenderness,  Skin: no rashes Extremities: no Pedal edema, no calf tenderness Neurologic: without any new focal findings Gait not checked due to patient safety concerns  Vitals:   09/22/20 1400  09/22/20 1430 09/22/20 1445 09/22/20 1500  BP: 133/84 113/79 130/86 (!) 127/99  Pulse: (!) 164 (!) 129 (!) 149 (!) 142  Resp: (!) 24 18 20 18   Temp:      TempSrc:      SpO2: 94% 93% 94% 93%  Weight:      Height:       No intake or output data in the 24 hours ending 09/22/20 1612 Filed Weights   09/21/20 2042  Weight: 52.2 kg    Data Reviewed: I have personally reviewed and interpreted daily labs, tele strips, imagings as discussed above. I reviewed all nursing notes, pharmacy notes, vitals, pertinent old records I have discussed plan of care as described above with RN and patient/family.  CBC: Recent Labs  Lab 09/21/20 2055 09/22/20 0543  WBC 5.5 4.0  HGB 13.2 13.2  HCT 38.5 39.1  MCV 100.3* 99.0  PLT 197 185   Basic Metabolic Panel: Recent Labs  Lab 09/21/20 2055 09/22/20 0543  NA 136 135  K 3.7 3.9  CL 103 102  CO2 26 27  GLUCOSE 147* 154*  BUN 9 13  CREATININE 0.67 0.72  CALCIUM 8.1* 8.4*  MG 1.9  --     Studies: DG Chest 2 View  Result Date: 09/21/2020 CLINICAL DATA:  Chest pain and shortness of breath. New onset atrial fibrillation and RVR. EXAM: CHEST - 2 VIEW COMPARISON:  Chest x-ray 02/07/2006 FINDINGS: The heart and mediastinal contours are unchanged. Aortic calcification. No focal consolidation. No pulmonary edema. Increased and coarsened interstitial markings. Bilateral trace pleural effusion. No pneumothorax. No acute osseous abnormality. IMPRESSION: Mild pulmonary edema with trace bilateral pleural effusions. Electronically Signed   By: 04/08/2006 M.D.   On: 09/21/2020 21:35    Scheduled Meds:  ascorbic acid  1,000 mg Oral BID   atorvastatin  20 mg Oral QHS   diltiazem  30 mg Oral Q6H   FLUoxetine  40 mg Oral Daily   fluticasone furoate-vilanterol  1 puff Inhalation Daily   ipratropium  0.5 mg Nebulization Q6H   methylPREDNISolone (SOLU-MEDROL) injection  40 mg Intravenous Q12H   multivitamin with minerals  1 tablet Oral Daily    pantoprazole  40 mg Oral Daily   Vitamin E  400 Units Oral BID   Continuous Infusions:  amiodarone 30 mg/hr (09/22/20 1311)   azithromycin Stopped (09/22/20 0009)   heparin 750 Units/hr (09/22/20 0942)   PRN Meds: acetaminophen, ipratropium, ondansetron (ZOFRAN) IV  Time spent: 35 minutes  Author: 09/24/20. MD [facility] 09/22/2020 4:12 PM  To reach On-call, see care teams to locate the attending and reach out to them via [email or web address]/2020. If 7PM-7AM, please contact night-coverage If you still have difficulty reaching the attending provider, please page the [facility] (Director on Call) for [facility] on amion for assistance.

## 2020-09-22 NOTE — ED Notes (Signed)
Per pharmacy, heparin drip timed for 10 am d/t pt's last dose of eliquis

## 2020-09-22 NOTE — Consult Note (Signed)
Cardiology Consult    Patient ID: Angela Singh MRN: [id number], DOB/AGE: 07-29-1950   Admit date: 09/21/2020 Date of Consult: 09/22/2020  Primary Physician: Oswaldo Conroy, MD Primary Cardiologist: Julien Nordmann, MD Requesting Provider: Dannial Monarch, MD  Patient Profile    Angela Singh is a 70 y.o. female with a history of HTN, HL, tob abuse, COPD, and GERD, who is being seen today for the evaluation of dyspnea/CHF and AFib RVR at the request of Dr. Lucianne Muss.  Past Medical History   Past Medical History:  Diagnosis Date   COPD (chronic obstructive pulmonary disease) (HCC)    Essential hypertension    GERD (gastroesophageal reflux disease)    Mixed hyperlipidemia    Tobacco abuse     History reviewed. No pertinent surgical history.   Allergies  Allergies  Allergen Reactions   Codeine Other (See Comments)    weakness    History of Present Illness    70 y/o ? w/ a h/o tob abuse, COPD, HTN, HL, and GERD.  She has no prior cardiac history.  Over the past week, she has been experiencing progressive dyspnea, productive cough, and congestion.  She has had COPD flares before, and did not think that she would require medical evaluation.  On 8/18, she came out of the shower and found that EMT's were in her back yard.  Her boyfriend, who she lives with, had been chopping down some bamboo in the back yard, when he was electrocuted by a wire overhead.  He called EMS.  Upon their arrival, he was actually feeling reasonably well, and he asked them if they could evaluate Angela Singh, due to her progressive dyspnea.  She was found to be tachycardic in afib and they advised transport to the ED for eval.  Here, 12 lead showed afib @ 151 bpm.  Labs were unremarkable with the exception of elevated BNP @ 547.2.  HsTrops nl x 2.  CXR showed mild pulm edema and trace bilat pl effusions.  She was initially treated w/ IV lasix and IV dilt, however, HRs remained markedly elevated despite maximally  tolerated dose of IV dilt, and she was transitioned to IV amiodarone.  On amio, rates have been trending in the 120's to 130's.  Pt notes improvement in resp status, and has also received nebs/abx/steroids.  She remains in afib in the 120's currently and has no complaints.  Inpatient Medications     ascorbic acid  1,000 mg Oral BID   atorvastatin  20 mg Oral QHS   diltiazem  30 mg Oral Q6H   FLUoxetine  40 mg Oral Daily   fluticasone furoate-vilanterol  1 puff Inhalation Daily   ipratropium  0.5 mg Nebulization Q6H   methylPREDNISolone (SOLU-MEDROL) injection  40 mg Intravenous Q12H   multivitamin with minerals  1 tablet Oral Daily   pantoprazole  40 mg Oral Daily   Vitamin E  400 Units Oral BID    Family History    Family History  Problem Relation Age of Onset   COPD Mother    Heart failure Father    Heart disease Sister    She indicated that her mother is deceased. She indicated that her father is deceased. She indicated that her sister is alive.   Social History    Social History   Socioeconomic History   Marital status: Divorced    Spouse name: Not on file   Number of children: Not on file   Years of education: Not  on file   Highest education level: Not on file  Occupational History   Not on file  Tobacco Use   Smoking status: Every Day    Packs/day: 1.00    Years: 48.00    Pack years: 48.00    Types: Cigarettes   Smokeless tobacco: Not on file  Substance and Sexual Activity   Alcohol use: Not Currently   Drug use: Never   Sexual activity: Not on file  Other Topics Concern   Not on file  Social History Narrative   Lives locally.  Does not routinely exercise.   Social Determinants of Health   Financial Resource Strain: Not on file  Food Insecurity: Not on file  Transportation Needs: Not on file  Physical Activity: Not on file  Stress: Not on file  Social Connections: Not on file  Intimate Partner Violence: Not on file     Review of Systems     General:  No chills, fever, night sweats or weight changes.  Cardiovascular:  No chest pain, +++ dyspnea on exertion, no edema, orthopnea, palpitations, paroxysmal nocturnal dyspnea. Dermatological: No rash, lesions/masses Respiratory: +++ productive cough - white sputum, +++ dyspnea, +++ wheezing Urologic: No hematuria, dysuria Abdominal:   No nausea, vomiting, diarrhea, bright red blood per rectum, melena, or hematemesis Neurologic:  No visual changes, wkns, changes in mental status. All other systems reviewed and are otherwise negative except as noted above.  Physical Exam    Blood pressure 101/72, pulse 91, temperature 98.9 F (37.2 C), temperature source Oral, resp. rate 20, height 5\' 4"  (1.626 m), weight 52.2 kg, SpO2 92 %.  General: Pleasant, NAD Psych: Normal affect. Neuro: Alert and oriented X 3. Moves all extremities spontaneously. HEENT: Normal  Neck: Supple without bruits or JVD. Lungs:  Resp regular and unlabored, diminished breath sounds bilat w/ insp/exp wheezing throughout. Heart: IR, IR, no s3, s4, or murmurs. Abdomen: Soft, non-tender, non-distended, BS + x 4.  Extremities: No clubbing, cyanosis or edema. DP/PT2+, Radials 2+ and equal bilaterally.  Labs    Cardiac Enzymes Recent Labs  Lab 09/21/20 2055 09/21/20 2243  TROPONINIHS 11 12      Lab Results  Component Value Date   WBC 4.0 09/22/2020   HGB 13.2 09/22/2020   HCT 39.1 09/22/2020   MCV 99.0 09/22/2020   PLT 185 09/22/2020    Recent Labs  Lab 09/21/20 2055 09/22/20 0543  NA 136 135  K 3.7 3.9  CL 103 102  CO2 26 27  BUN 9 13  CREATININE 0.67 0.72  CALCIUM 8.1* 8.4*  PROT 6.3*  --   BILITOT 0.9  --   ALKPHOS 74  --   ALT 43  --   AST 58*  --   GLUCOSE 147* 154*   Lab Results  Component Value Date   CHOL 153 09/22/2020   HDL 73 09/22/2020   LDLCALC 73 09/22/2020   TRIG 33 09/22/2020     Radiology Studies    DG Chest 2 View  Result Date: 09/21/2020 CLINICAL DATA:  Chest  pain and shortness of breath. New onset atrial fibrillation and RVR. EXAM: CHEST - 2 VIEW COMPARISON:  Chest x-ray 02/07/2006 FINDINGS: The heart and mediastinal contours are unchanged. Aortic calcification. No focal consolidation. No pulmonary edema. Increased and coarsened interstitial markings. Bilateral trace pleural effusion. No pneumothorax. No acute osseous abnormality. IMPRESSION: Mild pulmonary edema with trace bilateral pleural effusions. Electronically Signed   By: 04/08/2006 M.D.   On: 09/21/2020 21:35  ECG & Cardiac Imaging    Afib, 151, anterior infarct - no old for comparison - personally reviewed.  Assessment & Plan    1.  Afib RVR:  Pt presented to the ED 8/18 w/ a 1 wk h/o progressive dyspnea, wheezing, and productive cough.  She was found to be in afib w/ RVR @ 151 (denied palpitations).  She was placed on IV dilt however, rates were recalcitrant to max dose IV dilt (and bp's soft), and she was transitioned to IV amio.  Rates are still hovering in the 130's, though she notes feeling much better.  Cont amiodarone loading and we will add oral dilt @ 30mg  q6h.  Follow rates and consider digoxin.  Agree w/ IV heparin w/ plan to transition to eliquis provided that echo looks ok (may need ischemic eval otw).  If she can be adequately rate-controlled, we can potentially look to anticoagulate her x 4 wks and subsequently proceed w/ DCCV @ that time, if necessary.  2.  Acute CHF:  Pt presented w/ progressive dyspnea and wheezing.  In the setting of afib w/ RVR, CXR consistent with mild pulmonary edema and bilat pl effusions.  She has received a dose of IV lasix and is feeling better.  She does not appear significantly volume overloaded on exam currently.  Follow in the setting of ongoing afib.  Await echo.  3.  COPD/Tobacco abuse:  Progressive dyspnea and wheezing over the past week.  Long h/o tob abuse - says now she plans to quit.  Afebrile, nl WBC.  On exam her breath sounds are  diminished w/ insp/exp wheezing.  Nebs/steroids/abx per IM.  4.  Essential HTN:  currently stable.  On HCTZ @ home.  Hold for now in setting of adding po dilt for afib.  5.  HL: cont atorvastatin.  Signed, , NP 09/22/2020, 10:19 AM  For questions or updates, please contact   Please consult [email or web address] for contact info under Cardiology/STEMI.

## 2020-09-22 NOTE — Progress Notes (Signed)
*  PRELIMINARY RESULTS* Echocardiogram 2D Echocardiogram has been performed.  Angela Singh 09/22/2020, 7:34 PM

## 2020-09-22 NOTE — Consult Note (Addendum)
ANTICOAGULATION CONSULT NOTE  Pharmacy Consult for IV Heparin Indication: atrial fibrillation  Patient Measurements: Heparin Dosing Weight: 52.2 kg  Labs: Recent Labs    09/21/20 2055 09/21/20 2243 09/22/20 0543 09/22/20 1601 09/22/20 2259  HGB 13.2  --  13.2  --   --   HCT 38.5  --  39.1  --   --   PLT 197  --  185  --   --   APTT  --   --  30 72* 63*  LABPROT  --   --  15.9*  --   --   INR  --   --  1.3*  --   --   HEPARINUNFRC  --   --  >1.10*  --   --   CREATININE 0.67  --  0.72  --   --   TROPONINIHS 11 12  --   --   --      Estimated Creatinine Clearance: 55.5 mL/min (by C-G formula based on SCr of 0.72 mg/dL).   Medications:  Apixaban 5 mg given in ED 8/18 at 2242. This medication was not on home medication list Heparin Dosing Weight: 52.2 kg  Assessment: Patient is a 70 y/o F with medical history including HTN, COPD, GERD who presented to the ED with shortness of breath and was subsequently found to have new-onset Afib with RVR. Patient was started on Eliquis and received one dose. Pharmacy now consulted to initiate heparin infusion for Afib.  Baseline CBC acceptable. Baseline aPTT, PT-INR, and heparin level ordered for tomorrow AM.    Date Time aPTT/HL Rate/Comment 8/19 1601  72s   therapeutic x 1; 750 un/hr 8/19 2259 63s   Slightly subtherapeutic; 750>850 un/hr  Baseline Labs: aPTT - 30s INR - 1.3 Hgb - 13.2 Plts - 197>185  Goal of Therapy:  Heparin level 0.3-0.7 units/ml aPTT 66 - 102 seconds Monitor platelets by anticoagulation protocol: Yes   Plan:  Slightly subtherapeutic aPTT @63s , will bolus and inc infusion. Check aPTT & HL at next interval to assess if DOAC interference persists. If correlation, then anti-Xa alone. Bolus 800 units x1; then increase Heparin rate to 850 units/hr  Will check aPTT in 6 hours after rate change. Plan to follow aPTT given interference of apixaban on heparin level. Plan to transition to heparin level monitoring  once correlation is established or if baseline heparin level is low Daily CBC per protocol while on IV heparin  , RPH,BCCP 09/22/2020 11:48 PM

## 2020-09-22 NOTE — ED Notes (Signed)
MD messaged in regard to pt's HR maintaining between 120-130s. No new orders at this time.

## 2020-09-22 NOTE — Hospital Course (Signed)
98.9, 149, 27, 110/58, 96 

## 2020-09-22 NOTE — ED Notes (Signed)
This RN started amio bolus and pt blood pressure dropped to 81/60. This RN paused amio and informed Linna Darner, MD about pressures. This RN was told to restart amio because blood pressure may have just reacted to bolus. This RN was instructed to message Para March, MD for further consultation.

## 2020-09-22 NOTE — ED Notes (Signed)
Notified Dr. Para March of pt's HR and BP, and reason for not titrating up on amiodarone. Dr. Para March ok'd pt to stay at current rate of infusion.

## 2020-09-22 NOTE — ED Notes (Signed)
Lab called to collect APTT blood specimen

## 2020-09-22 NOTE — ED Notes (Signed)
Pt purewick and chux changed by [employer] Mechanic NT

## 2020-09-23 ENCOUNTER — Encounter: Payer: Self-pay | Admitting: Student

## 2020-09-23 DIAGNOSIS — I4891 Unspecified atrial fibrillation: Secondary | ICD-10-CM | POA: Diagnosis not present

## 2020-09-23 DIAGNOSIS — J441 Chronic obstructive pulmonary disease with (acute) exacerbation: Secondary | ICD-10-CM | POA: Diagnosis not present

## 2020-09-23 LAB — BASIC METABOLIC PANEL
Anion gap: 10 (ref 5–15)
BUN: 18 mg/dL (ref 8–23)
CO2: 25 mmol/L (ref 22–32)
Calcium: 9 mg/dL (ref 8.9–10.3)
Chloride: 97 mmol/L — ABNORMAL LOW (ref 98–111)
Creatinine, Ser: 0.76 mg/dL (ref 0.44–1.00)
GFR, Estimated: >60 mL/min (ref >60)
Glucose, Bld: 173 mg/dL — ABNORMAL HIGH (ref 70–99)
Potassium: 3.9 mmol/L (ref 3.5–5.1)
Sodium: 132 mmol/L — ABNORMAL LOW (ref 135–145)

## 2020-09-23 LAB — ECHOCARDIOGRAM COMPLETE
AR max vel: 1.51 cm2
AV Peak grad: 5.8 mmHg
Ao pk vel: 1.2 m/s
Area-P 1/2: 4.86 cm2
Height: 64 in
S' Lateral: 2.99 cm
Weight: 1840 oz

## 2020-09-23 LAB — PHOSPHORUS: Phosphorus: 3.4 mg/dL (ref 2.5–4.6)

## 2020-09-23 LAB — CBC
HCT: 36.4 % (ref 36.0–46.0)
Hemoglobin: 12.5 g/dL (ref 12.0–15.0)
MCH: 33.9 pg (ref 26.0–34.0)
MCHC: 34.3 g/dL (ref 30.0–36.0)
MCV: 98.6 fL (ref 80.0–100.0)
Platelets: 196 10*3/uL (ref 150–400)
RBC: 3.69 MIL/uL — ABNORMAL LOW (ref 3.87–5.11)
RDW: 12.5 % (ref 11.5–15.5)
WBC: 11 10*3/uL — ABNORMAL HIGH (ref 4.0–10.5)
nRBC: 0 % (ref 0.0–0.2)

## 2020-09-23 LAB — APTT: aPTT: 67 seconds — ABNORMAL HIGH (ref 24–36)

## 2020-09-23 LAB — GLUCOSE, CAPILLARY: Glucose-Capillary: 150 mg/dL — ABNORMAL HIGH (ref 70–99)

## 2020-09-23 LAB — HEPARIN LEVEL (UNFRACTIONATED): Heparin Unfractionated: 0.7 IU/mL (ref 0.30–0.70)

## 2020-09-23 LAB — THYROID PANEL WITH TSH
Free Thyroxine Index: 2.8 (ref 1.2–4.9)
T3 Uptake Ratio: 32 % (ref 24–39)
T4, Total: 8.9 ug/dL (ref 4.5–12.0)
TSH: 0.669 u[IU]/mL (ref 0.450–4.500)

## 2020-09-23 LAB — MAGNESIUM: Magnesium: 2.1 mg/dL (ref 1.7–2.4)

## 2020-09-23 MED ORDER — BISOPROLOL FUMARATE 5 MG PO TABS
2.5000 mg | ORAL_TABLET | Freq: Every day | ORAL | Status: DC
  Administered 2020-09-23: 2.5 mg via ORAL
  Filled 2020-09-23 (×3): qty 0.5

## 2020-09-23 MED ORDER — DIGOXIN 0.25 MG/ML IJ SOLN
0.2500 mg | Freq: Once | INTRAMUSCULAR | Status: AC
  Administered 2020-09-23: 0.25 mg via INTRAVENOUS
  Filled 2020-09-23 (×2): qty 2

## 2020-09-23 MED ORDER — DILTIAZEM HCL 30 MG PO TABS
45.0000 mg | ORAL_TABLET | Freq: Four times a day (QID) | ORAL | Status: DC
  Administered 2020-09-23 – 2020-09-24 (×4): 45 mg via ORAL
  Filled 2020-09-23 (×2): qty 0.5
  Filled 2020-09-23: qty 1.5
  Filled 2020-09-23: qty 0.5
  Filled 2020-09-23: qty 2
  Filled 2020-09-23 (×2): qty 0.5
  Filled 2020-09-23: qty 2

## 2020-09-23 MED ORDER — HEPARIN BOLUS VIA INFUSION
800.0000 [IU] | Freq: Once | INTRAVENOUS | Status: AC
  Administered 2020-09-23: 800 [IU] via INTRAVENOUS
  Filled 2020-09-23: qty 800

## 2020-09-23 MED ORDER — DIGOXIN 0.25 MG/ML IJ SOLN
0.2500 mg | Freq: Once | INTRAMUSCULAR | Status: AC
  Administered 2020-09-23: 0.25 mg via INTRAVENOUS
  Filled 2020-09-23: qty 2

## 2020-09-23 MED ORDER — METOPROLOL TARTRATE 25 MG PO TABS: 25.0000 mg | ORAL_TABLET | Freq: Two times a day (BID) | ORAL | Status: DC

## 2020-09-23 MED ORDER — SODIUM CHLORIDE 0.9 % IV SOLN
INTRAVENOUS | Status: DC | PRN
  Administered 2020-09-23: 250 mL via INTRAVENOUS

## 2020-09-23 MED ORDER — APIXABAN 5 MG PO TABS
5.0000 mg | ORAL_TABLET | Freq: Two times a day (BID) | ORAL | Status: DC
  Administered 2020-09-23 – 2020-09-26 (×7): 5 mg via ORAL
  Filled 2020-09-23 (×7): qty 1

## 2020-09-23 NOTE — ED Notes (Signed)
Called lab to come draw labs added on.

## 2020-09-23 NOTE — ED Notes (Signed)
Pt talking on the phone at this time

## 2020-09-23 NOTE — ED Notes (Signed)
MD at bedside. 

## 2020-09-23 NOTE — ED Notes (Signed)
Lab at the bedside 

## 2020-09-23 NOTE — Plan of Care (Signed)

## 2020-09-23 NOTE — ED Notes (Signed)
Pt provided general hygiene products and a clean gown. Ambulatory to toilet to clean up. Instructed if she becomes too SOB to pull red cord to alert staff.

## 2020-09-23 NOTE — Progress Notes (Addendum)
Progress Note  Patient Name: Angela Singh Date of Encounter: 09/23/2020  Primary Cardiologist: Julien Nordmann, MD  Subjective   Can't really tell if breathing has improved any.  Remains in afib, generally rapid in the 120's to 130's.  Denies chest pain or palipitations.  Inpatient Medications    Scheduled Meds:  ascorbic acid  1,000 mg Oral BID   atorvastatin  20 mg Oral QHS   digoxin  0.25 mg Intravenous Once   diltiazem  30 mg Oral Q6H   FLUoxetine  40 mg Oral Daily   fluticasone furoate-vilanterol  1 puff Inhalation Daily   ipratropium  0.5 mg Nebulization Q6H   methylPREDNISolone (SOLU-MEDROL) injection  40 mg Intravenous Q12H   multivitamin with minerals  1 tablet Oral Daily   pantoprazole  40 mg Oral Daily   Vitamin E  400 Units Oral BID   Continuous Infusions:  amiodarone 30 mg/hr (09/23/20 0756)   azithromycin Stopped (09/23/20 0011)   heparin 850 Units/hr (09/23/20 0451)   PRN Meds: acetaminophen, ipratropium, ondansetron (ZOFRAN) IV   Vital Signs    Vitals:   09/23/20 0630 09/23/20 0700 09/23/20 0730 09/23/20 0830  BP: 128/80 126/72 125/84 113/68  Pulse: (!) 114 (!) 113 (!) 110 (!) 132  Resp: 15 14    Temp:      TempSrc:      SpO2: 97% 96% 95% 96%  Weight:      Height:        Intake/Output Summary (Last 24 hours) at 09/23/2020 0912 Last data filed at 09/23/2020 0756 Gross per 24 hour  Intake 1270.43 ml  Output --  Net 1270.43 ml   Filed Weights   09/21/20 2042  Weight: 52.2 kg    Physical Exam   GEN: Thin, somewhat frail, in no acute distress.  HEENT: Grossly normal.  Neck: Supple, no JVD, carotid bruits, or masses. Cardiac: IR, IR, distant, tachy, no murmurs, rubs, or gallops. No clubbing, cyanosis, edema.  Radials 2+, DP/PT 1+ and equal bilaterally.  Respiratory:  Respirations regular and unlabored, diminished, coarse breath sounds bilat w/ occas exp wheezing. GI: Soft, nontender, nondistended, BS + x 4. MS: no deformity or  atrophy. Skin: warm and dry, no rash. Neuro:  Strength and sensation are intact. Psych: AAOx3.  Normal affect.  Labs    Chemistry Recent Labs  Lab 09/21/20 2055 09/22/20 0543 09/23/20 0655  NA 136 135 132*  K 3.7 3.9 3.9  CL 103 102 97*  CO2 26 27 25   GLUCOSE 147* 154* 173*  BUN 9 13 18   CREATININE 0.67 0.72 0.76  CALCIUM 8.1* 8.4* 9.0  PROT 6.3*  --   --   ALBUMIN 3.7  --   --   AST 58*  --   --   ALT 43  --   --   ALKPHOS 74  --   --   BILITOT 0.9  --   --   GFRNONAA >60 >60 >60  ANIONGAP 7 6 10      Hematology Recent Labs  Lab 09/21/20 2055 09/22/20 0543 09/23/20 0655  WBC 5.5 4.0 11.0*  RBC 3.84* 3.95 3.69*  HGB 13.2 13.2 12.5  HCT 38.5 39.1 36.4  MCV 100.3* 99.0 98.6  MCH 34.4* 33.4 33.9  MCHC 34.3 33.8 34.3  RDW 12.4 12.4 12.5  PLT 197 185 196    Cardiac Enzymes  Recent Labs  Lab 09/21/20 2055 09/21/20 2243  TROPONINIHS 11 12      BNP Recent Labs  Lab 09/21/20 2055  BNP 547.2*    Lipids  Lab Results  Component Value Date   CHOL 153 09/22/2020   HDL 73 09/22/2020   LDLCALC 73 09/22/2020   TRIG 33 09/22/2020   CHOLHDL 2.1 09/22/2020    HbA1c  Lab Results  Component Value Date   HGBA1C 5.9 (H) 09/22/2020    Radiology    DG Chest 2 View  Result Date: 09/21/2020 CLINICAL DATA:  Chest pain and shortness of breath. New onset atrial fibrillation and RVR. EXAM: CHEST - 2 VIEW COMPARISON:  Chest x-ray 02/07/2006 FINDINGS: The heart and mediastinal contours are unchanged. Aortic calcification. No focal consolidation. No pulmonary edema. Increased and coarsened interstitial markings. Bilateral trace pleural effusion. No pneumothorax. No acute osseous abnormality. IMPRESSION: Mild pulmonary edema with trace bilateral pleural effusions. Electronically Signed   By: Tish Frederickson M.D.   On: 09/21/2020 21:35    Telemetry    Afib, 120's to 130's - Personally Reviewed  Cardiac Studies   2D Echocardiogram 8.19.2022  1. Left ventricular  ejection fraction, by estimation, is 60 to 65%. The  left ventricle has normal function. The left ventricle has no regional  wall motion abnormalities. Left ventricular diastolic parameters are  indeterminate. The average left  ventricular global longitudinal strain is -9.4 %. The global longitudinal  strain is abnormal.   2. Right ventricular systolic function is normal. The right ventricular  size is normal. There is mildly elevated pulmonary artery systolic  pressure. The estimated right ventricular systolic pressure is 38.2 mmHg.   3. The mitral valve is normal in structure. Mild to moderate mitral valve  regurgitation.   4. Tricuspid valve regurgitation is moderate.   5. Left atrial size was mildly dilated.   6. Rhythm is atrial fibrillation, rate 120 bpm   Patient Profile      70 y.o. female with a history of HTN, HL, tob abuse, COPD, and GERD, who presented 8/18 w/ dyspnea and rapid afib.  Assessment & Plan    1. Afib w/ RVR: Pt presented to the ED 8/18 w/ a 1 wk h/o progressive dyspnea, wheezing, and productive cough.  She was found to be in afib w/ RVR @ 151 (denied palpitations).  She was placed on IV dilt however, rates were recalcitrant to max dose IV dilt (and bp's soft), and she was transitioned to IV amio. Rates remained elevated throughout the day yesterday and this AM - 120s  130s.  Denies palpitations.  Breathing about the same.  She's currently on IV amio and dilt 30 q6h. Pressures mostly 1-teens.  I will give digoxin 0.25mg  IV this AM.  Will plan to repeat later today and start PO digoxin tomorrow.  Ideally, would like to get off of amio, since duration of afib is unknown.  Will titrate dilt to 45mg  q6h and add low dose bisoprolol.  Can hopefully d/c' amio later.  Echo w/ nl EF, no rwma.  RVSP moderately elevated in setting of COPD (euvolemic on exam).  Will transition heparin to eliquis.  If rates remain elevated tomorrow, will likely plan on TEE/DCCV for Monday, provided  that resp status is improving.  2.  Acute CHF:  ? Syst vs Diast.  Pt presented w/ progressive dyspnea and wheezing.  In the setting of afib w/ RVR, CXR consistent with mild pulmonary edema and bilat pl effusions. Appears euvolemic on exam.  HsTrops nl.  Echo w/ nl EF.  3.  Acute on chronic resp failure/COPD/Tob abuse:   Progressive  dyspnea and wheezing over the past week.  Long h/o tob abuse - says now she plans to quit. Still w/ coarse breath sounds and occas exp wheezing.  Has received azithromycin.  Steroids/nebs per IM.    4.  Essential HTN:  Stable.  Follow w/ med adjustments.  5.  HL:  LDL 73 on statin.  Signed, Nicolasa Ducking, NP  09/23/2020, 9:12 AM    For questions or updates, please contact   Please consult [email or web address] for contact info under Cardiology/STEMI.

## 2020-09-23 NOTE — Progress Notes (Signed)
[facility] Progress Note  Patient: Angela Singh    EGB:[id number]  DOA: 09/21/2020     Date of Service: the patient was seen and examined on 09/23/2020  Chief Complaint  Patient presents with   Shortness of Breath   Brief hospital course: Angela Singh is a 70 y.o. 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   The patient presents to the emergency department due to shortness of breath for the past 1 week that has been getting worse.  Associated wheezing and purulent cough with white sputum.  Endorses some dyspnea on exertion and some orthopnea.  Today her heart started racing therefore she called EMS.  No known history of CHF.   Upon EMS arrival her heart rate was in the 200s.  There was concern for SVT and the patient was given adenosine and metoprolol.  Heart rate did improve.  EKG in the emergency department showed A. fib with RVR.  The patient was given a diltiazem bolus and then started on a diltiazem drip.   On my exam her heart rate is still in the 130s to 150s.  Maxed out on the diltiazem drip.  Denies any chest pain.  Denies any previous history of A. fib.  ED Course: 640 mg IV x1, DuoNebs 3 mL x 1, Solu-Medrol 125 mg IV x1, diltiazem 10 mg IV x1, diltiazem drip.  EKG, COVID 19 PCR, flu A/B PCR, CBC, magnesium, CMP, troponin, BNP.   Assessment and Plan:  # New onset atrial fibrillation with rapid ventricular response S/p Cardizem infusion and Hep gtt Cardiology consulted, patient was started on amiodarone infusion,  Cardizem 45 mg p.o. every 6 hourly, s/p digoxin 0.125 IV,  Started bisoprolol 2.5 mg p.o. daily S/p heparin IV infusion, transition to Eliquis 5 mg p.o. twice daily  Cardio will continue to titrate medications upwards as blood pressure tolerates, if no improvement in heart rate may need transesophageal echo and cardioversion Monday morning Follow cardiology for further  recommendation   # Acute dyspnea secondary to COPD exacerbation and suspected mild CHF decompensation Continue Breo Ellipta inhaler daily, ipratropium nebulizer every 6 hourly Solu-Medrol 40 every 12 hourly We will transition to oral steroids after improvement Empiric antibiotic azithromycin 500 daily for 3 days  # Hypertension, hyperlipidemia Continue statin Held HCTZ, Cardizem started   # Tobacco dependence: Counseled on smoking cessation. # Depression/anxiety: Continue home Prozac 40 mg daily. # GERD: Continue home Protonix 40 mg daily    Body mass index is 19.74 kg/m.  Interventions:       Diet: Heart healthy DVT Prophylaxis: Therapeutic Anticoagulation with heparin IV infusion    Advance goals of care discussion: Full code  Family Communication: family was NOT present at bedside, at the time of interview.  The pt provided permission to discuss medical plan with the family. Opportunity was given to ask question and all questions were answered satisfactorily.   Disposition:  Pt is from Home, admitted with A. Fib w/rvr, still has A. fib, which precludes a safe discharge. Discharge to Home, when A. fib will be controlled and cleared by cardiology, most likely in 1 to 2 days.  Subjective: No significant overnight events, patient still has significant shortness of breath, denies any palpitations, no chest pain.  Denies any abdominal pain, no nausea vomiting or diarrhea.   Physical Exam: General:  alert oriented to time, place, and person.  Appear in mild distress, affect  appropriate Eyes: PERRLA ENT: Oral Mucosa Clear, moist  Neck: no JVD,  Cardiovascular: S1 and S2 Present, no Murmur,  Respiratory: good respiratory effort, Bilateral Air entry equal and Decreased, no Crackles, significant wheezing bilaterally  Abdomen: Bowel Sound present, Soft and no tenderness,  Skin: no rashes Extremities: no Pedal edema, no calf tenderness Neurologic: without any new focal  findings Gait not checked due to patient safety concerns  Vitals:   09/23/20 1415 09/23/20 1430 09/23/20 1500 09/23/20 1530  BP:      Pulse: (!) 108 (!) 110 79 (!) 113  Resp:      Temp:      TempSrc:      SpO2: 94% 93% 92% 93%  Weight:      Height:        Intake/Output Summary (Last 24 hours) at 09/23/2020 1538 Last data filed at 09/23/2020 1044 Gross per 24 hour  Intake 1493.86 ml  Output 400 ml  Net 1093.86 ml   Filed Weights   09/21/20 2042  Weight: 52.2 kg    Data Reviewed: I have personally reviewed and interpreted daily labs, tele strips, imagings as discussed above. I reviewed all nursing notes, pharmacy notes, vitals, pertinent old records I have discussed plan of care as described above with RN and patient/family.  CBC: Recent Labs  Lab 09/21/20 2055 09/22/20 0543 09/23/20 0655  WBC 5.5 4.0 11.0*  HGB 13.2 13.2 12.5  HCT 38.5 39.1 36.4  MCV 100.3* 99.0 98.6  PLT 197 185 196   Basic Metabolic Panel: Recent Labs  Lab 09/21/20 2055 09/22/20 0543 09/23/20 0655  NA 136 135 132*  K 3.7 3.9 3.9  CL 103 102 97*  CO2 26 27 25   GLUCOSE 147* 154* 173*  BUN 9 13 18   CREATININE 0.67 0.72 0.76  CALCIUM 8.1* 8.4* 9.0  MG 1.9  --  2.1  PHOS  --   --  3.4    Studies: ECHOCARDIOGRAM COMPLETE  Result Date: 09/23/2020    ECHOCARDIOGRAM REPORT   Patient Name:   Angela FlavorsDARCY J Bankert Date of Exam: 09/22/2020 Medical Rec #:  [id number]      Height:       64.0 in Accession #:    [id number]     Weight:       115.0 lb Date of Birth:  1951-01-19      BSA:          1.546 m Patient Age:    69 years       BP:           107/76 mmHg Patient Gender: F              HR:           113 bpm. [facility] Procedure: 2D Echo, Cardiac Doppler and Color Doppler Indications:     Atrial Fibrillation I48.91  History:         Patient has no prior history of Echocardiogram examinations.                  Risk Factors:Hypertension. HLD.  Sonographer:     Neysa Bonitohristy Roar Referring Phys:  [id number]  Antonieta IbaIMOTHY J GOLLAN Diagnosing Phys: Julien Nordmannimothy Gollan MD IMPRESSIONS  1. Left ventricular ejection fraction, by estimation, is 60 to 65%. The left ventricle has normal function. The left ventricle has no regional wall motion abnormalities. Left ventricular diastolic parameters are indeterminate. The average left ventricular global longitudinal strain is -9.4 %. The global longitudinal  strain is abnormal.  2. Right ventricular systolic function is normal. The right ventricular size is normal. There is mildly elevated pulmonary artery systolic pressure. The estimated right ventricular systolic pressure is 38.2 mmHg.  3. The mitral valve is normal in structure. Mild to moderate mitral valve regurgitation.  4. Tricuspid valve regurgitation is moderate.  5. Left atrial size was mildly dilated.  6. Rhythm is atrial fibrillation, rate 120 bpm FINDINGS  Left Ventricle: Left ventricular ejection fraction, by estimation, is 60 to 65%. The left ventricle has normal function. The left ventricle has no regional wall motion abnormalities. The average left ventricular global longitudinal strain is -9.4 %. The  global longitudinal strain is abnormal. The left ventricular internal cavity size was normal in size. There is no left ventricular hypertrophy. Left ventricular diastolic parameters are indeterminate. Right Ventricle: The right ventricular size is normal. No increase in right ventricular wall thickness. Right ventricular systolic function is normal. There is mildly elevated pulmonary artery systolic pressure. The tricuspid regurgitant velocity is 2.88  m/s, and with an assumed right atrial pressure of 5 mmHg, the estimated right ventricular systolic pressure is 38.2 mmHg. Left Atrium: Left atrial size was mildly dilated. Right Atrium: Right atrial size was normal in size. Pericardium: There is no evidence of pericardial effusion. Mitral Valve: The mitral valve is normal in structure. Mild to moderate mitral valve regurgitation.  No evidence of mitral valve stenosis. Tricuspid Valve: The tricuspid valve is normal in structure. Tricuspid valve regurgitation is moderate . No evidence of tricuspid stenosis. Aortic Valve: The aortic valve was not well visualized. Aortic valve regurgitation is not visualized. No aortic stenosis is present. Aortic valve peak gradient measures 5.8 mmHg. Pulmonic Valve: The pulmonic valve was normal in structure. Pulmonic valve regurgitation is not visualized. No evidence of pulmonic stenosis. Aorta: The aortic root is normal in size and structure. Venous: The inferior vena cava is normal in size with greater than 50% respiratory variability, suggesting right atrial pressure of 3 mmHg. IAS/Shunts: No atrial level shunt detected by color flow Doppler.  LEFT VENTRICLE PLAX 2D LVIDd:         4.04 cm  Diastology LVIDs:         2.99 cm  LV e' medial:    8.81 cm/s LV PW:         0.95 cm  LV E/e' medial:  11.6 LV IVS:        0.90 cm  LV e' lateral:   13.20 cm/s LVOT diam:     1.70 cm  LV E/e' lateral: 7.7 LVOT Area:     2.27 cm                         2D Longitudinal Strain                         2D Strain GLS Avg:     -9.4 % RIGHT VENTRICLE RV Basal diam:  3.76 cm RV Mid diam:    2.94 cm RV S prime:     14.50 cm/s TAPSE (M-mode): 2.0 cm LEFT ATRIUM             Index       RIGHT ATRIUM           Index LA diam:        3.80 cm 2.46 cm/m  RA Area:     21.20 cm LA Vol (A2C):  66.7 ml 43.13 ml/m RA Volume:   63.90 ml  41.32 ml/m LA Vol (A4C):   57.8 ml 37.38 ml/m LA Biplane Vol: 63.5 ml 41.06 ml/m  AORTIC VALVE                PULMONIC VALVE AV Area (Vmax): 1.51 cm    PV Vmax:        0.92 m/s AV Vmax:        120.00 cm/s PV Peak grad:   3.4 mmHg AV Peak Grad:   5.8 mmHg    RVOT Peak grad: 2 mmHg LVOT Vmax:      79.70 cm/s  AORTA Ao Asc diam: 2.60 cm MITRAL VALVE                TRICUSPID VALVE MV Area (PHT): 4.86 cm     TR Peak grad:   33.2 mmHg MV Decel Time: 156 msec     TR Vmax:        288.00 cm/s MV E velocity:  102.00 cm/s                             SHUNTS                             Systemic Diam: 1.70 cm Julien Nordmann MD Electronically signed by Julien Nordmann MD Signature Date/Time: 09/23/2020/9:37:47 AM    Final     Scheduled Meds:  apixaban  5 mg Oral BID   ascorbic acid  1,000 mg Oral BID   atorvastatin  20 mg Oral QHS   bisoprolol  2.5 mg Oral Daily   diltiazem  45 mg Oral Q6H   FLUoxetine  40 mg Oral Daily   fluticasone furoate-vilanterol  1 puff Inhalation Daily   ipratropium  0.5 mg Nebulization Q6H   methylPREDNISolone (SOLU-MEDROL) injection  40 mg Intravenous Q12H   multivitamin with minerals  1 tablet Oral Daily   pantoprazole  40 mg Oral Daily   Vitamin E  400 Units Oral BID   Continuous Infusions:  amiodarone 30 mg/hr (09/23/20 0756)   azithromycin Stopped (09/23/20 0011)   PRN Meds: acetaminophen, ipratropium, ondansetron (ZOFRAN) IV  Time spent: 35 minutes  Author: Gillis Santa. MD [facility] 09/23/2020 3:38 PM  To reach On-call, see care teams to locate the attending and reach out to them via [email or web address]. If 7PM-7AM, please contact night-coverage If you still have difficulty reaching the attending provider, please page the [facility] (Director on Call) for [facility] on amion for assistance.

## 2020-09-23 NOTE — ED Notes (Signed)
Lab at bedside

## 2020-09-23 NOTE — ED Notes (Signed)
Informed RN bed assigned 

## 2020-09-23 NOTE — ED Notes (Signed)
Dr Gollan at bedside °

## 2020-09-24 DIAGNOSIS — E785 Hyperlipidemia, unspecified: Secondary | ICD-10-CM | POA: Diagnosis not present

## 2020-09-24 DIAGNOSIS — I5031 Acute diastolic (congestive) heart failure: Secondary | ICD-10-CM

## 2020-09-24 DIAGNOSIS — I4891 Unspecified atrial fibrillation: Secondary | ICD-10-CM | POA: Diagnosis not present

## 2020-09-24 DIAGNOSIS — F32A Depression, unspecified: Secondary | ICD-10-CM

## 2020-09-24 DIAGNOSIS — J441 Chronic obstructive pulmonary disease with (acute) exacerbation: Secondary | ICD-10-CM | POA: Diagnosis not present

## 2020-09-24 LAB — CBC
HCT: 36.6 % (ref 36.0–46.0)
Hemoglobin: 12.2 g/dL (ref 12.0–15.0)
MCH: 32.8 pg (ref 26.0–34.0)
MCHC: 33.3 g/dL (ref 30.0–36.0)
MCV: 98.4 fL (ref 80.0–100.0)
Platelets: 185 10*3/uL (ref 150–400)
RBC: 3.72 MIL/uL — ABNORMAL LOW (ref 3.87–5.11)
RDW: 12.6 % (ref 11.5–15.5)
WBC: 11 10*3/uL — ABNORMAL HIGH (ref 4.0–10.5)
nRBC: 0 % (ref 0.0–0.2)

## 2020-09-24 LAB — GLUCOSE, CAPILLARY
Glucose-Capillary: 124 mg/dL — ABNORMAL HIGH (ref 70–99)
Glucose-Capillary: 147 mg/dL — ABNORMAL HIGH (ref 70–99)
Glucose-Capillary: 138 mg/dL — ABNORMAL HIGH (ref 70–99)
Glucose-Capillary: 198 mg/dL — ABNORMAL HIGH (ref 70–99)

## 2020-09-24 LAB — BASIC METABOLIC PANEL
Anion gap: 8 (ref 5–15)
BUN: 22 mg/dL (ref 8–23)
CO2: 27 mmol/L (ref 22–32)
Calcium: 8.9 mg/dL (ref 8.9–10.3)
Chloride: 101 mmol/L (ref 98–111)
Creatinine, Ser: 0.67 mg/dL (ref 0.44–1.00)
GFR, Estimated: >60 mL/min (ref >60)
Glucose, Bld: 128 mg/dL — ABNORMAL HIGH (ref 70–99)
Potassium: 4.1 mmol/L (ref 3.5–5.1)
Sodium: 136 mmol/L (ref 135–145)

## 2020-09-24 LAB — MAGNESIUM: Magnesium: 2.2 mg/dL (ref 1.7–2.4)

## 2020-09-24 LAB — PHOSPHORUS: Phosphorus: 2.7 mg/dL (ref 2.5–4.6)

## 2020-09-24 MED ORDER — BISOPROLOL FUMARATE 5 MG PO TABS
5.0000 mg | ORAL_TABLET | Freq: Every day | ORAL | Status: DC
  Administered 2020-09-24 – 2020-09-25 (×2): 5 mg via ORAL
  Filled 2020-09-24 (×2): qty 1

## 2020-09-24 MED ORDER — DILTIAZEM HCL 30 MG PO TABS
60.0000 mg | ORAL_TABLET | Freq: Four times a day (QID) | ORAL | Status: DC
  Administered 2020-09-24 – 2020-09-25 (×4): 60 mg via ORAL
  Filled 2020-09-24 (×2): qty 2

## 2020-09-24 MED ORDER — GUAIFENESIN-DM 100-10 MG/5ML PO SYRP
5.0000 mL | ORAL_SOLUTION | ORAL | Status: DC | PRN
  Administered 2020-09-24: 5 mL via ORAL
  Filled 2020-09-24: qty 5

## 2020-09-24 MED ORDER — FUROSEMIDE 10 MG/ML IJ SOLN
40.0000 mg | Freq: Two times a day (BID) | INTRAMUSCULAR | Status: DC
  Administered 2020-09-24 – 2020-09-25 (×3): 40 mg via INTRAVENOUS
  Filled 2020-09-24 (×3): qty 4

## 2020-09-24 MED ORDER — DIGOXIN 250 MCG PO TABS
0.2500 mg | ORAL_TABLET | Freq: Every day | ORAL | Status: DC
  Administered 2020-09-24 – 2020-09-26 (×3): 0.25 mg via ORAL
  Filled 2020-09-24 (×3): qty 1

## 2020-09-24 NOTE — Progress Notes (Signed)
Patient ID: Angela Singh, female   DOB: 27-Jun-1950, 70 y.o.   MRN: [id number] [facility] PROGRESS NOTE  Angela Singh FFM:[id number] DOB: 1950/07/23 DOA: 09/21/2020 PCP: Oswaldo Conroy, MD  HPI/Subjective: Patient feels a little short of breath.  When she walks she gets very short of breath.  Being treated here for rapid atrial fibrillation.  Objective: Vitals:   09/24/20 1133 09/24/20 1408  BP: 135/85   Pulse: 100   Resp: 18   Temp: 98 F (36.7 C)   SpO2: 96% 96%    Intake/Output Summary (Last 24 hours) at 09/24/2020 1504 Last data filed at 09/24/2020 0347 Gross per 24 hour  Intake 402.27 ml  Output --  Net 402.27 ml   Filed Weights   09/21/20 2042 09/24/20 0500  Weight: 52.2 kg 55.9 kg    ROS: Review of Systems  Respiratory:  Negative for shortness of breath.   Cardiovascular:  Negative for chest pain.  Gastrointestinal:  Negative for abdominal pain, nausea and vomiting.  Exam: Physical Exam HENT:     Head: Normocephalic.     Mouth/Throat:     Pharynx: No oropharyngeal exudate.  Eyes:     General: Lids are normal.     Conjunctiva/sclera: Conjunctivae normal.  Cardiovascular:     Rate and Rhythm: Normal rate and regular rhythm.     Heart sounds: Normal heart sounds, S1 normal and S2 normal.  Pulmonary:     Breath sounds: Examination of the right-middle field reveals decreased breath sounds. Examination of the left-middle field reveals decreased breath sounds. Examination of the right-lower field reveals decreased breath sounds and wheezing. Examination of the left-lower field reveals decreased breath sounds and wheezing. Decreased breath sounds and wheezing present. No rhonchi or rales.  Abdominal:     Palpations: Abdomen is soft.     Tenderness: There is no abdominal tenderness.  Musculoskeletal:     Right upper arm: No swelling.     Left upper arm: No swelling.  Skin:    General: Skin is warm.     Findings: No rash.  Neurological:     Mental  Status: She is alert and oriented to person, place, and time.      Scheduled Meds:  apixaban  5 mg Oral BID   ascorbic acid  1,000 mg Oral BID   atorvastatin  20 mg Oral QHS   bisoprolol  5 mg Oral Daily   digoxin  0.25 mg Oral Daily   diltiazem  60 mg Oral Q6H   FLUoxetine  40 mg Oral Daily   fluticasone furoate-vilanterol  1 puff Inhalation Daily   furosemide  40 mg Intravenous BID   ipratropium  0.5 mg Nebulization Q6H   methylPREDNISolone (SOLU-MEDROL) injection  40 mg Intravenous Q12H   multivitamin with minerals  1 tablet Oral Daily   pantoprazole  40 mg Oral Daily   Vitamin E  400 Units Oral BID   Continuous Infusions:  sodium chloride Stopped (09/23/20 2227)   amiodarone 30 mg/hr (09/24/20 1253)    Assessment/Plan:  Atrial fibrillation with rapid ventricular response.  On amiodarone drip short acting Cardizem and bisoprolol and digoxin.  Patient on Eliquis for anticoagulation.  Cardiology to do a TEE cardioversion tomorrow. COPD exacerbation.  Continue Solu-Medrol and nebulizer treatments.  Completed Zithromax Acute diastolic congestive heart failure. On oral Lasix and bisoprolol Hyperlipidemia unspecified on atorvastatin Essential hypertension on Cardizem and bisoprolol Depression on fluoxetine        Code Status:  Code Status Orders  (From admission, onward)           Start     Ordered   09/21/20 2219  Full code  Continuous        09/21/20 2223           Code Status History     This patient has a current code status but no historical code status.      Family Communication: Spoke with sister on the phone Disposition Plan: Status is: Inpatient  Dispo: The patient is from: Home              Anticipated d/c is to: Home              Patient currently to have a TEE cardioversion tomorrow   Difficult to place patient.  No.  Consultants: Cardiologist  Time spent: 27 minutes  Sammuel [employer]

## 2020-09-24 NOTE — Progress Notes (Signed)
Progress Note  Patient Name: Angela Singh Date of Encounter: 09/24/2020  Primary Cardiologist: Julien Nordmann, MD  Subjective   Has not seen a substantial change in her breathing.  Becomes very dyspneic when getting up going to the bathroom.  Has noted some increase in abdominal fullness and says her weight is elevated above baseline.  Remains in atrial fibrillation with rates in the 100-110 range.  Inpatient Medications    Scheduled Meds:  apixaban  5 mg Oral BID   ascorbic acid  1,000 mg Oral BID   atorvastatin  20 mg Oral QHS   bisoprolol  2.5 mg Oral Daily   diltiazem  45 mg Oral Q6H   FLUoxetine  40 mg Oral Daily   fluticasone furoate-vilanterol  1 puff Inhalation Daily   ipratropium  0.5 mg Nebulization Q6H   methylPREDNISolone (SOLU-MEDROL) injection  40 mg Intravenous Q12H   multivitamin with minerals  1 tablet Oral Daily   pantoprazole  40 mg Oral Daily   Vitamin E  400 Units Oral BID   Continuous Infusions:  sodium chloride Stopped (09/23/20 2227)   amiodarone 30 mg/hr (09/24/20 0347)   PRN Meds: sodium chloride, acetaminophen, ipratropium, ondansetron (ZOFRAN) IV   Vital Signs    Vitals:   09/24/20 0500 09/24/20 0540 09/24/20 0816 09/24/20 0817  BP:  130/78  (!) 128/109  Pulse:  (!) 111  (!) 110  Resp:    19  Temp:      TempSrc:      SpO2:   95% 95%  Weight: 55.9 kg     Height:        Intake/Output Summary (Last 24 hours) at 09/24/2020 0832 Last data filed at 09/24/2020 0347 Gross per 24 hour  Intake 625.7 ml  Output 400 ml  Net 225.7 ml   Filed Weights   09/21/20 2042 09/24/20 0500  Weight: 52.2 kg 55.9 kg    Physical Exam   GEN: Thin, somewhat frail, in no acute distress.  HEENT: Grossly normal.  Neck: Supple, mildly elevated JVP, no carotid bruits, or masses. Cardiac: Irregularly irregular, distant, tachycardic, no murmurs, rubs, or gallops. No clubbing, cyanosis, trace bilateral pretibial edema.  Radials 2+, DP/PT 1+ and equal  bilaterally.  Respiratory:  Respirations regular and unlabored, coarse breath sounds throughout with occasional expiratory wheezing. GI: Semifirm, nontender, BS + x 4. MS: no deformity or atrophy. Skin: warm and dry, no rash. Neuro:  Strength and sensation are intact. Psych: AAOx3.  Normal affect.  Labs    Chemistry Recent Labs  Lab 09/21/20 2055 09/22/20 0543 09/23/20 0655 09/24/20 0507  NA 136 135 132* 136  K 3.7 3.9 3.9 4.1  CL 103 102 97* 101  CO2 26 27 25 27   GLUCOSE 147* 154* 173* 128*  BUN 9 13 18 22   CREATININE 0.67 0.72 0.76 0.67  CALCIUM 8.1* 8.4* 9.0 8.9  PROT 6.3*  --   --   --   ALBUMIN 3.7  --   --   --   AST 58*  --   --   --   ALT 43  --   --   --   ALKPHOS 74  --   --   --   BILITOT 0.9  --   --   --   GFRNONAA >60 >60 >60 >60  ANIONGAP 7 6 10 8      Hematology Recent Labs  Lab 09/22/20 0543 09/23/20 0655 09/24/20 0507  WBC 4.0 11.0* 11.0*  RBC  3.95 3.69* 3.72*  HGB 13.2 12.5 12.2  HCT 39.1 36.4 36.6  MCV 99.0 98.6 98.4  MCH 33.4 33.9 32.8  MCHC 33.8 34.3 33.3  RDW 12.4 12.5 12.6  PLT 185 196 185    Cardiac Enzymes  Recent Labs  Lab 09/21/20 2055 09/21/20 2243  TROPONINIHS 11 12      BNP Recent Labs  Lab 09/21/20 2055  BNP 547.2*    Lipids  Lab Results  Component Value Date   CHOL 153 09/22/2020   HDL 73 09/22/2020   LDLCALC 73 09/22/2020   TRIG 33 09/22/2020   CHOLHDL 2.1 09/22/2020    HbA1c  Lab Results  Component Value Date   HGBA1C 5.9 (H) 09/22/2020    Radiology    DG Chest 2 View  Result Date: 09/21/2020 CLINICAL DATA:  Chest pain and shortness of breath. New onset atrial fibrillation and RVR. EXAM: CHEST - 2 VIEW COMPARISON:  Chest x-ray 02/07/2006 FINDINGS: The heart and mediastinal contours are unchanged. Aortic calcification. No focal consolidation. No pulmonary edema. Increased and coarsened interstitial markings. Bilateral trace pleural effusion. No pneumothorax. No acute osseous abnormality. IMPRESSION:  Mild pulmonary edema with trace bilateral pleural effusions. Electronically Signed   By: Tish Frederickson M.D.   On: 09/21/2020 21:35   Telemetry    Atrial fibrillation 100-110- Personally Reviewed  Cardiac Studies   2D Echocardiogram 8.20.2022  1. Left ventricular ejection fraction, by estimation, is 60 to 65%. The  left ventricle has normal function. The left ventricle has no regional  wall motion abnormalities. Left ventricular diastolic parameters are  indeterminate. The average left  ventricular global longitudinal strain is -9.4 %. The global longitudinal  strain is abnormal.   2. Right ventricular systolic function is normal. The right ventricular  size is normal. There is mildly elevated pulmonary artery systolic  pressure. The estimated right ventricular systolic pressure is 38.2 mmHg.   3. The mitral valve is normal in structure. Mild to moderate mitral valve  regurgitation.   4. Tricuspid valve regurgitation is moderate.   5. Left atrial size was mildly dilated.   6. Rhythm is atrial fibrillation, rate 120 bpm   Patient Profile      70 y.o. female with a history of HTN, HL, tob abuse, COPD, and GERD, who presented 8/18 w/ dyspnea and rapid afib.  Assessment & Plan    Atrial fibrillation with rapid ventricular response:  Pt presented to the ED 8/18 w/ a 1 wk h/o progressive dyspnea, wheezing, and productive cough.  She was found to be in afib w/ RVR @ 151 (denied palpitations).  She was placed on IV dilt however, rates were recalcitrant to max dose IV dilt (and bp's soft), and she was transitioned to IV amio.  She has remained on intravenous amiodarone in addition to oral diltiazem, beta-blocker, and 2 doses of IV digoxin on August 20th.  Rates currently trending 100-110.  She has not seen a significant improvement in dyspnea up to this point.  Blood pressures have been stable.  I will increase her diltiazem to 60 mg every 6 hours.  Continue low-dose bisoprolol and amiodarone.   She appears to be mildly volume overloaded which is likely being driven by A. fib.  We will plan TEE and cardioversion tomorrow morning.  2.  Acute heart failure with preserved ejection fraction: Patient with progressive dyspnea, wheezing, and mild pulmonary edema and bilateral pleural effusions on admission.  She did receive Lasix in the emergency room.  Echo with  normal LV function.  RVSP was elevated at 38.2 mmHg.  This morning, she notes her weight is above prior baseline and abdomen feels full to her.  She does have mild JVD and trace pretibial edema.  She remains dyspneic with minimal activity.  I have ordered Lasix 40 mg IV twice daily.  I suspect ongoing rapid atrial fibrillation is worsening diastolic function and resulting in failure.  A. fib management as above.  3.  Acute on chronic respiratory failure/COPD/tobacco abuse: Progressive dyspnea and wheezing over about a week prior to admission.  Long history of tobacco abuse though now says that she will quit.  Still with coarse breath sounds and occasional expiratory wheezing.  Previously treated with azithromycin.  She remains on steroids and nebulizer therapy.  4.  Essential hypertension: Stable.  5.  Hyperlipidemia: LDL 73 on statin.  Signed, Nicolasa Ducking, NP  09/24/2020, 8:32 AM    For questions or updates, please contact   Please consult [email or web address] for contact info under Cardiology/STEMI.

## 2020-09-25 ENCOUNTER — Inpatient Hospital Stay: Payer: Medicare HMO | Admitting: Anesthesiology

## 2020-09-25 ENCOUNTER — Encounter
Admission: EM | Disposition: A | Discharge status: Home / Self Care | Payer: Self-pay | Source: Home / Self Care | Attending: Internal Medicine

## 2020-09-25 ENCOUNTER — Inpatient Hospital Stay (HOSPITAL_COMMUNITY)
Admit: 2020-09-25 | Discharge: 2020-09-25 | Disposition: A | Discharge status: Home / Self Care | Payer: Medicare HMO | Attending: Nurse Practitioner | Admitting: Nurse Practitioner

## 2020-09-25 DIAGNOSIS — I361 Nonrheumatic tricuspid (valve) insufficiency: Secondary | ICD-10-CM | POA: Diagnosis not present

## 2020-09-25 DIAGNOSIS — I5031 Acute diastolic (congestive) heart failure: Secondary | ICD-10-CM

## 2020-09-25 DIAGNOSIS — R531 Weakness: Secondary | ICD-10-CM

## 2020-09-25 DIAGNOSIS — I4891 Unspecified atrial fibrillation: Secondary | ICD-10-CM | POA: Diagnosis not present

## 2020-09-25 DIAGNOSIS — E785 Hyperlipidemia, unspecified: Secondary | ICD-10-CM | POA: Diagnosis not present

## 2020-09-25 DIAGNOSIS — I340 Nonrheumatic mitral (valve) insufficiency: Secondary | ICD-10-CM

## 2020-09-25 DIAGNOSIS — J441 Chronic obstructive pulmonary disease with (acute) exacerbation: Secondary | ICD-10-CM | POA: Diagnosis not present

## 2020-09-25 HISTORY — PX: TEE WITHOUT CARDIOVERSION: SHX5443

## 2020-09-25 LAB — GLUCOSE, CAPILLARY
Glucose-Capillary: 140 mg/dL — ABNORMAL HIGH (ref 70–99)
Glucose-Capillary: 193 mg/dL — ABNORMAL HIGH (ref 70–99)
Glucose-Capillary: 180 mg/dL — ABNORMAL HIGH (ref 70–99)

## 2020-09-25 LAB — BASIC METABOLIC PANEL
Anion gap: 7 (ref 5–15)
BUN: 25 mg/dL — ABNORMAL HIGH (ref 8–23)
CO2: 31 mmol/L (ref 22–32)
Calcium: 8.9 mg/dL (ref 8.9–10.3)
Chloride: 98 mmol/L (ref 98–111)
Creatinine, Ser: 0.8 mg/dL (ref 0.44–1.00)
GFR, Estimated: >60 mL/min (ref >60)
Glucose, Bld: 140 mg/dL — ABNORMAL HIGH (ref 70–99)
Potassium: 4 mmol/L (ref 3.5–5.1)
Sodium: 136 mmol/L (ref 135–145)

## 2020-09-25 LAB — CBC
HCT: 37.3 % (ref 36.0–46.0)
Hemoglobin: 12.5 g/dL (ref 12.0–15.0)
MCH: 32.7 pg (ref 26.0–34.0)
MCHC: 33.5 g/dL (ref 30.0–36.0)
MCV: 97.6 fL (ref 80.0–100.0)
Platelets: 180 10*3/uL (ref 150–400)
RBC: 3.82 MIL/uL — ABNORMAL LOW (ref 3.87–5.11)
RDW: 12.5 % (ref 11.5–15.5)
WBC: 10.5 10*3/uL (ref 4.0–10.5)
nRBC: 0 % (ref 0.0–0.2)

## 2020-09-25 LAB — MAGNESIUM: Magnesium: 2.2 mg/dL (ref 1.7–2.4)

## 2020-09-25 LAB — PHOSPHORUS: Phosphorus: 3.3 mg/dL (ref 2.5–4.6)

## 2020-09-25 SURGERY — CARDIOVERSION
Anesthesia: Choice

## 2020-09-25 SURGERY — ECHOCARDIOGRAM, TRANSESOPHAGEAL
Anesthesia: General

## 2020-09-25 MED ORDER — SODIUM CHLORIDE 0.9 % IV SOLN: INTRAVENOUS | Status: DC | PRN

## 2020-09-25 MED ORDER — BUTAMBEN-TETRACAINE-BENZOCAINE 2-2-14 % EX AERO
INHALATION_SPRAY | CUTANEOUS | Status: AC
  Administered 2020-09-25: 2
  Filled 2020-09-25: qty 5

## 2020-09-25 MED ORDER — PREDNISONE 20 MG PO TABS
40.0000 mg | ORAL_TABLET | Freq: Every day | ORAL | Status: DC
  Administered 2020-09-26: 40 mg via ORAL
  Filled 2020-09-25: qty 2

## 2020-09-25 MED ORDER — EPINEPHRINE 1 MG/10ML IJ SOSY
PREFILLED_SYRINGE | INTRAMUSCULAR | Status: AC
  Filled 2020-09-25: qty 10

## 2020-09-25 MED ORDER — PROPOFOL 10 MG/ML IV BOLUS
INTRAVENOUS | Status: DC | PRN
  Administered 2020-09-25 (×3): 20 mg via INTRAVENOUS
  Administered 2020-09-25: 40 mg via INTRAVENOUS
  Administered 2020-09-25: 20 mg via INTRAVENOUS
  Administered 2020-09-25: 30 mg via INTRAVENOUS

## 2020-09-25 MED ORDER — LIDOCAINE HCL (CARDIAC) PF 100 MG/5ML IV SOSY
PREFILLED_SYRINGE | INTRAVENOUS | Status: DC | PRN
  Administered 2020-09-25: 40 mg via INTRAVENOUS

## 2020-09-25 MED ORDER — EPHEDRINE 5 MG/ML INJ
INTRAVENOUS | Status: AC
  Filled 2020-09-25: qty 10

## 2020-09-25 MED ORDER — DILTIAZEM HCL 30 MG PO TABS
30.0000 mg | ORAL_TABLET | Freq: Four times a day (QID) | ORAL | Status: DC
  Administered 2020-09-25 – 2020-09-26 (×3): 30 mg via ORAL
  Filled 2020-09-25 (×3): qty 1

## 2020-09-25 MED ORDER — FUROSEMIDE 40 MG PO TABS
40.0000 mg | ORAL_TABLET | Freq: Every day | ORAL | Status: DC
  Administered 2020-09-26: 40 mg via ORAL
  Filled 2020-09-25: qty 1

## 2020-09-25 MED ORDER — LIDOCAINE VISCOUS HCL 2 % MT SOLN
OROMUCOSAL | Status: AC
  Filled 2020-09-25: qty 15

## 2020-09-25 MED ORDER — ATROPINE SULFATE 1 MG/10ML IJ SOSY
PREFILLED_SYRINGE | INTRAMUSCULAR | Status: AC
  Filled 2020-09-25: qty 10

## 2020-09-25 MED ORDER — SODIUM CHLORIDE FLUSH 0.9 % IV SOLN
INTRAVENOUS | Status: AC
  Filled 2020-09-25: qty 10

## 2020-09-25 MED ORDER — IPRATROPIUM BROMIDE 0.02 % IN SOLN: 0.5000 mg | Freq: Four times a day (QID) | RESPIRATORY_TRACT | Status: DC | PRN

## 2020-09-25 MED ORDER — METOPROLOL TARTRATE 25 MG PO TABS
25.0000 mg | ORAL_TABLET | Freq: Four times a day (QID) | ORAL | Status: DC
  Administered 2020-09-25 – 2020-09-26 (×3): 25 mg via ORAL
  Filled 2020-09-25 (×3): qty 1

## 2020-09-25 MED ORDER — SODIUM CHLORIDE 0.9 % IV SOLN
INTRAVENOUS | Status: DC
Start: 2020-09-25 — End: 2020-09-25

## 2020-09-25 NOTE — Anesthesia Preprocedure Evaluation (Addendum)
Anesthesia Evaluation  Patient identified by MRN, date of birth, ID band Patient awake    Reviewed: Allergy & Precautions, NPO status , Patient's Chart, lab work & pertinent test results  History of Anesthesia Complications Negative for: history of anesthetic complications  Airway Mallampati: II  TM Distance: >3 FB Neck ROM: Full    Dental no notable dental hx. (+) Teeth Intact   Pulmonary neg sleep apnea, COPD, Current Smoker and Patient abstained from smoking.,    Pulmonary exam normal breath sounds clear to auscultation       Cardiovascular Exercise Tolerance: Good METShypertension, +CHF  (-) CAD and (-) Past MI + dysrhythmias Atrial Fibrillation + Valvular Problems/Murmurs MR  Rhythm:Irregular Rate:Tachycardia - Systolic murmurs TTE 2022: IMPRESSIONS    1. Left ventricular ejection fraction, by estimation, is 60 to 65%. The  left ventricle has normal function. The left ventricle has no regional  wall motion abnormalities. Left ventricular diastolic parameters are  indeterminate. The average left  ventricular global longitudinal strain is -9.4 %. The global longitudinal  strain is abnormal.  2. Right ventricular systolic function is normal. The right ventricular  size is normal. There is mildly elevated pulmonary artery systolic  pressure. The estimated right ventricular systolic pressure is 38.2 mmHg.  3. The mitral valve is normal in structure. Mild to moderate mitral valve  regurgitation.  4. Tricuspid valve regurgitation is moderate.  5. Left atrial size was mildly dilated.  6. Rhythm is atrial fibrillation, rate 120 bpm    Neuro/Psych PSYCHIATRIC DISORDERS Depression negative neurological ROS     GI/Hepatic GERD  ,(+)     (-) substance abuse  ,   Endo/Other  neg diabetes  Renal/GU negative Renal ROS     Musculoskeletal   Abdominal   Peds  Hematology   Anesthesia Other Findings Past Medical  History: No date: COPD (chronic obstructive pulmonary disease) (HCC) No date: Essential hypertension No date: GERD (gastroesophageal reflux disease) No date: Mixed hyperlipidemia No date: Tobacco abuse  Reproductive/Obstetrics                            Anesthesia Physical Anesthesia Plan  ASA: 3  Anesthesia Plan: General   Post-op Pain Management:    Induction: Intravenous  PONV Risk Score and Plan: 2 and Ondansetron, Propofol infusion and TIVA  Airway Management Planned: Nasal Cannula  Additional Equipment: None  Intra-op Plan:   Post-operative Plan:   Informed Consent: I have reviewed the patients History and Physical, chart, labs and discussed the procedure including the risks, benefits and alternatives for the proposed anesthesia with the patient or authorized representative who has indicated his/her understanding and acceptance.     Dental advisory given  Plan Discussed with: CRNA and Surgeon  Anesthesia Plan Comments: (Discussed risks of anesthesia with patient, including possibility of difficulty with spontaneous ventilation under anesthesia necessitating airway intervention, PONV, and rare risks such as cardiac or respiratory or neurological events, and allergic reactions. Patient understands.)        Anesthesia Quick Evaluation

## 2020-09-25 NOTE — Progress Notes (Signed)
Progress Note  Patient Name: LLUVIA GWYNNE Date of Encounter: 09/25/2020  Primary Cardiologist: Julien Nordmann, MD  Subjective   She reports significant improvement in shortness of breath.  She continues to be in atrial fibrillation with ventricular rate mostly in the low 100 range.  Inpatient Medications    Scheduled Meds:  apixaban  5 mg Oral BID   ascorbic acid  1,000 mg Oral BID   atorvastatin  20 mg Oral QHS   bisoprolol  5 mg Oral Daily   digoxin  0.25 mg Oral Daily   FLUoxetine  40 mg Oral Daily   fluticasone furoate-vilanterol  1 puff Inhalation Daily   furosemide  40 mg Intravenous BID   ipratropium  0.5 mg Nebulization Q6H   methylPREDNISolone (SOLU-MEDROL) injection  40 mg Intravenous Q12H   multivitamin with minerals  1 tablet Oral Daily   pantoprazole  40 mg Oral Daily   Vitamin E  400 Units Oral BID   Continuous Infusions:  sodium chloride Stopped (09/23/20 2227)   amiodarone 30 mg/hr (09/24/20 2347)   PRN Meds: sodium chloride, acetaminophen, guaiFENesin-dextromethorphan, ipratropium, ondansetron (ZOFRAN) IV   Vital Signs    Vitals:   09/24/20 2328 09/25/20 0436 09/25/20 0738 09/25/20 0843  BP: 118/65 128/74  122/71  Pulse: 99 (!) 107  93  Resp: 18 18  16   Temp: 97.8 F (36.6 C) 97.9 F (36.6 C)  (!) 97.5 F (36.4 C)  TempSrc: Oral Oral  Oral  SpO2: 96% 97% 93% 95%  Weight:      Height:        Intake/Output Summary (Last 24 hours) at 09/25/2020 0946 Last data filed at 09/24/2020 2347 Gross per 24 hour  Intake 333.11 ml  Output --  Net 333.11 ml    Filed Weights   09/21/20 2042 09/24/20 0500  Weight: 52.2 kg 55.9 kg    Physical Exam   GEN: Thin, somewhat frail, in no acute distress.  HEENT: Grossly normal.  Neck: Supple, mildly elevated JVP, no carotid bruits, or masses. Cardiac: Irregularly irregular, distant, tachycardic, no murmurs, rubs, or gallops. No clubbing, cyanosis, trace bilateral pretibial edema.  Radials 2+, DP/PT 1+  and equal bilaterally.  Respiratory: Lungs are clear to auscultation bilaterally GI: Semifirm, nontender, BS + x 4. MS: no deformity or atrophy. Skin: warm and dry, no rash. Neuro:  Strength and sensation are intact. Psych: AAOx3.  Normal affect.  Labs    Chemistry Recent Labs  Lab 09/21/20 2055 09/22/20 0543 09/23/20 0655 09/24/20 0507 09/25/20 0526  NA 136   < > 132* 136 136  K 3.7   < > 3.9 4.1 4.0  CL 103   < > 97* 101 98  CO2 26   < > 25 27 31   GLUCOSE 147*   < > 173* 128* 140*  BUN 9   < > 18 22 25*  CREATININE 0.67   < > 0.76 0.67 0.80  CALCIUM 8.1*   < > 9.0 8.9 8.9  PROT 6.3*  --   --   --   --   ALBUMIN 3.7  --   --   --   --   AST 58*  --   --   --   --   ALT 43  --   --   --   --   ALKPHOS 74  --   --   --   --   BILITOT 0.9  --   --   --   --  GFRNONAA >60   < > >60 >60 >60  ANIONGAP 7   < > 10 8 7    < > = values in this interval not displayed.      Hematology Recent Labs  Lab 09/23/20 0655 09/24/20 0507 09/25/20 0526  WBC 11.0* 11.0* 10.5  RBC 3.69* 3.72* 3.82*  HGB 12.5 12.2 12.5  HCT 36.4 36.6 37.3  MCV 98.6 98.4 97.6  MCH 33.9 32.8 32.7  MCHC 34.3 33.3 33.5  RDW 12.5 12.6 12.5  PLT 196 185 180     Cardiac Enzymes  Recent Labs  Lab 09/21/20 2055 09/21/20 2243  TROPONINIHS 11 12       BNP Recent Labs  Lab 09/21/20 2055  BNP 547.2*     Lipids  Lab Results  Component Value Date   CHOL 153 09/22/2020   HDL 73 09/22/2020   LDLCALC 73 09/22/2020   TRIG 33 09/22/2020   CHOLHDL 2.1 09/22/2020    HbA1c  Lab Results  Component Value Date   HGBA1C 5.9 (H) 09/22/2020    Radiology    DG Chest 2 View  Result Date: 09/21/2020 CLINICAL DATA:  Chest pain and shortness of breath. New onset atrial fibrillation and RVR. EXAM: CHEST - 2 VIEW COMPARISON:  Chest x-ray 02/07/2006 FINDINGS: The heart and mediastinal contours are unchanged. Aortic calcification. No focal consolidation. No pulmonary edema. Increased and coarsened  interstitial markings. Bilateral trace pleural effusion. No pneumothorax. No acute osseous abnormality. IMPRESSION: Mild pulmonary edema with trace bilateral pleural effusions. Electronically Signed   By: 04/08/2006 M.D.   On: 09/21/2020 21:35   Telemetry    Atrial fibrillation 100-110- Personally Reviewed  Cardiac Studies   2D Echocardiogram 8.20.2022  1. Left ventricular ejection fraction, by estimation, is 60 to 65%. The  left ventricle has normal function. The left ventricle has no regional  wall motion abnormalities. Left ventricular diastolic parameters are  indeterminate. The average left  ventricular global longitudinal strain is -9.4 %. The global longitudinal  strain is abnormal.   2. Right ventricular systolic function is normal. The right ventricular  size is normal. There is mildly elevated pulmonary artery systolic  pressure. The estimated right ventricular systolic pressure is 38.2 mmHg.   3. The mitral valve is normal in structure. Mild to moderate mitral valve  regurgitation.   4. Tricuspid valve regurgitation is moderate.   5. Left atrial size was mildly dilated.   6. Rhythm is atrial fibrillation, rate 120 bpm   Patient Profile      70 y.o. female with a history of HTN, HL, tob abuse, COPD, and GERD, who presented 8/18 w/ dyspnea and rapid afib.  Assessment & Plan    1. atrial fibrillation with rapid ventricular response: Ventricular rate continues to be difficult to control in spite of IV amiodarone, diltiazem, bisoprolol and digoxin.  I agree with TEE guided cardioversion which is scheduled for today.  I again discussed the procedure in details as well as risk and benefits.  I am going to hold diltiazem for now to avoid post cardioversion bradycardia.  Most likely, we will stop digoxin as well.   Continue anticoagulation with Eliquis.    2.  Acute heart failure with preserved ejection fraction: She improved with IV diuresis and her lungs are clear today.   Will likely switch to oral furosemide after cardioversion.  3.  Acute on chronic respiratory failure/COPD/tobacco abuse: Progressive dyspnea and wheezing over about a week prior to admission.  Long history of  tobacco abuse though now says that she will quit.  She is currently being managed with steroids and inhalers and seems to have improved significantly.  4.  Essential hypertension: Stable.  5.  Hyperlipidemia: LDL 73 on statin.  Signed, Lorine Bears, MD  09/25/2020, 9:46 AM    For questions or updates, please contact   Please consult [email or web address] for contact info under Cardiology/STEMI.

## 2020-09-25 NOTE — Anesthesia Postprocedure Evaluation (Signed)
Anesthesia Post Note  Patient: Angela Singh  Procedure(s) Performed: TRANSESOPHAGEAL ECHOCARDIOGRAM (TEE)  Patient location during evaluation: Specials Recovery Anesthesia Type: General Level of consciousness: awake and alert Pain management: pain level controlled Vital Signs Assessment: post-procedure vital signs reviewed and stable Respiratory status: spontaneous breathing, nonlabored ventilation, respiratory function stable and patient connected to nasal cannula oxygen Cardiovascular status: blood pressure returned to baseline and stable Postop Assessment: no apparent nausea or vomiting Anesthetic complications: no   No notable events documented.   Last Vitals:  Vitals:   09/25/20 1400 09/25/20 1415  BP: 106/60 112/65  Pulse: 69 (!) 107  Resp: 14 18  Temp:    SpO2: 92% 96%    Last Pain:  Vitals:   09/25/20 1415  TempSrc:   PainSc: 0-No pain                 Cleda Mccreedy Farhana Fellows

## 2020-09-25 NOTE — Progress Notes (Signed)
Patient ID: Angela Singh, female   DOB: December 20, 1950, 70 y.o.   MRN: [id number] [facility] PROGRESS NOTE  BREON DISS WUJ:[id number] DOB: 1950/06/12 DOA: 09/21/2020 PCP: Oswaldo Conroy, MD  HPI/Subjective: Patient seen before TEE.  Was nervous about the procedure.  Does feel some shortness of breath.  Some cough.  Admitted with atrial fibrillation rapid ventricular response  Objective: Vitals:   09/25/20 1400 09/25/20 1415  BP: 106/60 112/65  Pulse: 69 (!) 107  Resp: 14 18  Temp:    SpO2: 92% 96%    Intake/Output Summary (Last 24 hours) at 09/25/2020 1716 Last data filed at 09/25/2020 1349 Gross per 24 hour  Intake 633.11 ml  Output --  Net 633.11 ml   Filed Weights   09/21/20 2042 09/24/20 0500  Weight: 52.2 kg 55.9 kg    ROS: Review of Systems  Respiratory:  Positive for shortness of breath. Negative for cough.   Cardiovascular:  Negative for chest pain.  Gastrointestinal:  Negative for abdominal pain, nausea and vomiting.  Exam: Physical Exam HENT:     Head: Normocephalic.     Mouth/Throat:     Pharynx: No oropharyngeal exudate.  Eyes:     General: Lids are normal.     Conjunctiva/sclera: Conjunctivae normal.     Pupils: Pupils are equal, round, and reactive to light.  Cardiovascular:     Rate and Rhythm: Normal rate and regular rhythm.     Heart sounds: Normal heart sounds, S1 normal and S2 normal.  Pulmonary:     Breath sounds: Examination of the right-lower field reveals decreased breath sounds. Examination of the left-lower field reveals decreased breath sounds. Decreased breath sounds present. No wheezing, rhonchi or rales.  Abdominal:     Palpations: Abdomen is soft.     Tenderness: There is no abdominal tenderness.  Musculoskeletal:     Right lower leg: No swelling.     Left lower leg: No swelling.  Skin:    General: Skin is warm.     Findings: No rash.  Neurological:     Mental Status: She is alert and oriented to person, place, and  time.      Scheduled Meds:  apixaban  5 mg Oral BID   ascorbic acid  1,000 mg Oral BID   atorvastatin  20 mg Oral QHS   digoxin  0.25 mg Oral Daily   diltiazem  30 mg Oral Q6H   FLUoxetine  40 mg Oral Daily   fluticasone furoate-vilanterol  1 puff Inhalation Daily   [START ON 09/26/2020] furosemide  40 mg Oral Daily   metoprolol tartrate  25 mg Oral Q6H   multivitamin with minerals  1 tablet Oral Daily   pantoprazole  40 mg Oral Daily   [START ON 09/26/2020] predniSONE  40 mg Oral Q breakfast   sodium chloride flush       Vitamin E  400 Units Oral BID   Continuous Infusions:  sodium chloride Stopped (09/23/20 2227)    Assessment/Plan:  Atrial fibrillation with rapid ventricular response.  TEE was done but unable to do cardioversion secondary to left atrial thrombus.  Patient on short acting Cardizem metoprolol and digoxin at this point.  On Eliquis for anticoagulation COPD exacerbation had Solu-Medrol today we will switch over to prednisone for tomorrow.  Continue nebulizer treatments.  Completed Zithromax. Acute diastolic congestive heart failure secondary to rapid atrial fibrillation.  On oral Lasix and metoprolol. Hyperlipidemia unspecified on atorvastatin Essential hypertension on Cardizem and metoprolol  Depression on fluoxetine Weakness.  Nursing to ambulated physical therapy evaluation        Code Status:     Code Status Orders  (From admission, onward)           Start     Ordered   09/21/20 2219  Full code  Continuous        09/21/20 2223           Code Status History     This patient has a current code status but no historical code status.      Family Communication: Sister on the phone Disposition Plan: Status is: Inpatient  Dispo: The patient is from: Home              Anticipated d/c is to: Home              Patient currently needs to have rate control prior to disposition.  Need to see what happens when she ambulates.   Difficult to place  patient.  No.  Consultants: Cardiology  Time spent: 27 minutes, case discussed with cardiology  Alford Highland  [facility]

## 2020-09-25 NOTE — Progress Notes (Signed)
Transesophageal echocardiogram showed evidence of small mobile thrombus in the left atrial appendage.  Due to that, cardioversion was not performed.  I recommend rate control for now and anticoagulation for a minimal of 3 to 4 weeks followed by cardioversion at that time.  In order to prevent chemical conversion to sinus rhythm now, I discontinued amiodarone drip. Given that her respiratory status has improved, I switched bisoprolol to metoprolol for better rate control and resume diltiazem.  She is also on digoxin and will check digoxin level tomorrow morning. I switch IV furosemide to oral to be started tomorrow.

## 2020-09-25 NOTE — Progress Notes (Signed)
*  PRELIMINARY RESULTS* Echocardiogram Echocardiogram Transesophageal has been performed.  Cristela Blue 09/25/2020, 2:00 PM

## 2020-09-25 NOTE — Care Management Important Message (Signed)
Important Message  Patient Details  Name: CADIE SORCI MRN: [id number] Date of Birth: [birth year]   Medicare Important Message Given:  Yes     [name] 09/25/2020, 1:07 PM

## 2020-09-25 NOTE — Transfer of Care (Signed)
Immediate Anesthesia Transfer of Care Note  Patient: Angela Singh  Procedure(s) Performed: TRANSESOPHAGEAL ECHOCARDIOGRAM (TEE)  Patient Location: Special Procedures  Anesthesia Type:General  Level of Consciousness: patient cooperative  Airway & Oxygen Therapy: Patient Spontanous Breathing and Patient connected to nasal cannula oxygen  Post-op Assessment: Report given to RN and Post -op Vital signs reviewed and stable  Post vital signs: Reviewed and stable  Last Vitals:  Vitals Value Taken Time  BP 99/60 (72)   Temp    Pulse 85   Resp 14   SpO2 96%     Last Pain:  Vitals:   09/25/20 1319  TempSrc: Oral  PainSc: 0-No pain         Complications: No notable events documented.

## 2020-09-26 ENCOUNTER — Encounter: Payer: Self-pay | Admitting: Cardiovascular Disease

## 2020-09-26 DIAGNOSIS — I4891 Unspecified atrial fibrillation: Secondary | ICD-10-CM | POA: Diagnosis not present

## 2020-09-26 DIAGNOSIS — E785 Hyperlipidemia, unspecified: Secondary | ICD-10-CM | POA: Diagnosis not present

## 2020-09-26 DIAGNOSIS — I5031 Acute diastolic (congestive) heart failure: Secondary | ICD-10-CM | POA: Diagnosis not present

## 2020-09-26 DIAGNOSIS — I4819 Other persistent atrial fibrillation: Secondary | ICD-10-CM

## 2020-09-26 DIAGNOSIS — J441 Chronic obstructive pulmonary disease with (acute) exacerbation: Secondary | ICD-10-CM | POA: Diagnosis not present

## 2020-09-26 LAB — CBC
HCT: 36.2 % (ref 36.0–46.0)
Hemoglobin: 12.5 g/dL (ref 12.0–15.0)
MCH: 34.2 pg — ABNORMAL HIGH (ref 26.0–34.0)
MCHC: 34.5 g/dL (ref 30.0–36.0)
MCV: 98.9 fL (ref 80.0–100.0)
Platelets: 172 10*3/uL (ref 150–400)
RBC: 3.66 MIL/uL — ABNORMAL LOW (ref 3.87–5.11)
RDW: 12.5 % (ref 11.5–15.5)
WBC: 9.8 10*3/uL (ref 4.0–10.5)
nRBC: 0 % (ref 0.0–0.2)

## 2020-09-26 LAB — GLUCOSE, CAPILLARY
Glucose-Capillary: 106 mg/dL — ABNORMAL HIGH (ref 70–99)
Glucose-Capillary: 87 mg/dL (ref 70–99)

## 2020-09-26 LAB — BASIC METABOLIC PANEL
Anion gap: 9 (ref 5–15)
BUN: 34 mg/dL — ABNORMAL HIGH (ref 8–23)
CO2: 30 mmol/L (ref 22–32)
Calcium: 8.7 mg/dL — ABNORMAL LOW (ref 8.9–10.3)
Chloride: 99 mmol/L (ref 98–111)
Creatinine, Ser: 0.78 mg/dL (ref 0.44–1.00)
GFR, Estimated: >60 mL/min (ref >60)
Glucose, Bld: 109 mg/dL — ABNORMAL HIGH (ref 70–99)
Potassium: 3.5 mmol/L (ref 3.5–5.1)
Sodium: 138 mmol/L (ref 135–145)

## 2020-09-26 LAB — DIGOXIN LEVEL: Digoxin Level: 0.7 ng/mL — ABNORMAL LOW (ref 0.8–2.0)

## 2020-09-26 MED ORDER — FUROSEMIDE 40 MG PO TABS: 40.0000 mg | ORAL_TABLET | Freq: Every day | ORAL | 0 refills | Status: DC

## 2020-09-26 MED ORDER — PREDNISONE 20 MG PO TABS: ORAL_TABLET | ORAL | 0 refills | Status: DC

## 2020-09-26 MED ORDER — DILTIAZEM HCL ER COATED BEADS 180 MG PO CP24
180.0000 mg | ORAL_CAPSULE | Freq: Every day | ORAL | Status: DC
  Administered 2020-09-26: 180 mg via ORAL
  Filled 2020-09-26: qty 1

## 2020-09-26 MED ORDER — METOPROLOL SUCCINATE ER 50 MG PO TB24
50.0000 mg | ORAL_TABLET | Freq: Two times a day (BID) | ORAL | Status: DC
  Administered 2020-09-26: 50 mg via ORAL
  Filled 2020-09-26: qty 1

## 2020-09-26 MED ORDER — METOPROLOL SUCCINATE ER 50 MG PO TB24: 50.0000 mg | ORAL_TABLET | Freq: Two times a day (BID) | ORAL | 0 refills | Status: DC

## 2020-09-26 MED ORDER — DILTIAZEM HCL ER COATED BEADS 180 MG PO CP24: 180.0000 mg | ORAL_CAPSULE | Freq: Every day | ORAL | 0 refills | Status: DC

## 2020-09-26 MED ORDER — APIXABAN 5 MG PO TABS
5.0000 mg | ORAL_TABLET | Freq: Two times a day (BID) | ORAL | 0 refills | Status: DC
Start: 2020-09-26 — End: 2020-10-25

## 2020-09-26 MED ORDER — DIGOXIN 250 MCG PO TABS
0.2500 mg | ORAL_TABLET | Freq: Every day | ORAL | 0 refills | Status: DC
Start: 2020-09-27 — End: 2020-10-13

## 2020-09-26 NOTE — Evaluation (Signed)
Physical Therapy Evaluation Patient Details Name: Angela Singh MRN: [id number] DOB: [birth year] Today's Date: 09/26/2020   History of Present Illness  70 y.o. 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     The patient presents to the emergency department due to shortness of breath for the past 1 week that has been getting worse. Admitted with Afib with RVR, unable to have cardioversion in house due to thrombus on TEE..  Clinical Impression  Pt was able to ambulate safety and confidently around the nurses' station as well as negotiate up/down steps.  She did have elevated HR t/o, 90-100 on arrival, 90s-110 most of the time with intermittent spikes to low 120s.  Pt with minimal fatigue but overall did well, reports feeling good about being able to go home.     Follow Up Recommendations No PT follow up    Equipment Recommendations  None recommended by PT    Recommendations for Other Services       Precautions / Restrictions Precautions Precautions: None Restrictions Weight Bearing Restrictions: No      Mobility  Bed Mobility Overal bed mobility: Independent             General bed mobility comments: Pt easily gets to sitting EOB, dons socks in long sitting    Transfers Overall transfer level: Independent Equipment used: None             General transfer comment: Pt able to rise confidently w/o assist  Ambulation/Gait Ambulation/Gait assistance: Modified independent (Device/Increase time) Gait Distance (Feet): 250 Feet Assistive device: None       General Gait Details: Pt walks with good confidence and consistent cadence.  Her HR did rarely reach low 120s but was generally between 90-110 during the effort, mild fatigue reported after circumambulation of nurses' station.  Stairs Stairs: Yes Stairs assistance: Supervision Stair Management: One rail Right;Alternating  pattern Number of Stairs: 7 General stair comments: Pt was able to confidently and safety manage up/down steps w/o assist  Wheelchair Mobility    Modified Rankin (Stroke Patients Only)       Balance Overall balance assessment: Independent                                           Pertinent Vitals/Pain Pain Assessment: No/denies pain    Home Living Family/patient expects to be discharged to:: Private residence Living Arrangements: Spouse/significant other     Home Access: Stairs to enter   Secretary/administrator of Steps: 4          Prior Function Level of Independence: Independent         Comments: Pt able to be active and independent     Hand Dominance        Extremity/Trunk Assessment   Upper Extremity Assessment Upper Extremity Assessment: Overall WFL for tasks assessed    Lower Extremity Assessment Lower Extremity Assessment: Overall WFL for tasks assessed       Communication   Communication: No difficulties  Cognition Arousal/Alertness: Awake/alert Behavior During Therapy: WFL for tasks assessed/performed Overall Cognitive Status: Within Functional Limits for tasks assessed  General Comments General comments (skin integrity, edema, etc.): Pt with mild fatigue during prolonged ambulation, HR minimally elevated with activity but generally 90-110 (rarely to low 120s)    Exercises     Assessment/Plan    PT Assessment Patent does not need any further PT services  PT Problem List         PT Treatment Interventions      PT Goals (Current goals can be found in the Care Plan section)  Acute Rehab PT Goals Patient Stated Goal: Go home PT Goal Formulation: All assessment and education complete, DC therapy    Frequency     Barriers to discharge        Co-evaluation               AM-PAC PT "6 Clicks" Mobility  Outcome Measure Help needed turning from your  back to your side while in a flat bed without using bedrails?: None Help needed moving from lying on your back to sitting on the side of a flat bed without using bedrails?: None Help needed moving to and from a bed to a chair (including a wheelchair)?: None Help needed standing up from a chair using your arms (e.g., wheelchair or bedside chair)?: None Help needed to walk in hospital room?: None Help needed climbing 3-5 steps with a railing? : None 6 Click Score: 24    End of Session   Activity Tolerance: Patient tolerated treatment well Patient left: with call bell/phone within reach;with chair alarm set Nurse Communication: Mobility status PT Visit Diagnosis: Difficulty in walking, not elsewhere classified (R26.2)    Time: 0912-0927 PT Time Calculation (min) (ACUTE ONLY): 15 min   Charges:   PT Evaluation $PT Eval Low Complexity: 1 Low          Malachi Pro, DPT 09/26/2020, 1:10 PM

## 2020-09-26 NOTE — Discharge Summary (Signed)
[facility] [place] [facility]   PATIENT NAME: Angela Singh    MR#:  [id number]  DATE OF BIRTH:  [birth year]  DATE OF ADMISSION:  09/21/2020 ADMITTING PHYSICIAN: Gillis Santaileep Kumar, MD  DATE OF DISCHARGE: 09/26/2020  2:53 PM  PRIMARY CARE PHYSICIAN: Oswaldo ConroyBender, Abby Daneele, MD    ADMISSION DIAGNOSIS:  New onset atrial fibrillation (HCC) [I48.91] COPD exacerbation (HCC) [J44.1] Atrial fibrillation with RVR (HCC) [I48.91]  DISCHARGE DIAGNOSIS:  Active Problems:   Atrial fibrillation with RVR (HCC)   Hyperlipidemia   COPD exacerbation (HCC)   Acute diastolic CHF (congestive heart failure) (HCC)   Depression   Weakness   SECONDARY DIAGNOSIS:   Past Medical History:  Diagnosis Date   COPD (chronic obstructive pulmonary disease) (HCC)    Essential hypertension    GERD (gastroesophageal reflux disease)    Mixed hyperlipidemia    Tobacco abuse     HOSPITAL COURSE:   1.  Atrial fibrillation with rapid ventricular response.  TEE was done but unable to do cardioversion secondary to left atrial thrombus.  Patient already on Eliquis during this hospital course.  Eliquis bleeding risk explained to patient and family.  Potentially will need cardioversion in 3 weeks if still in atrial fibrillation.  The patient during the hospital course was on amiodarone drip digoxin Cardizem and bisoprolol.  Upon discharge patient will be on digoxin, Toprol-XL twice a day and Cardizem CD.  Heart rate better controlled.  When ambulating heart rate maximum went to 110-120.  At rest heart rate between the 80s and 90s.  Cardiology was okay with discharge.  Hopefully can get rid of digoxin or at least lower the dose as outpatient.  Digoxin level 0.7 on 09/26/2020.  Recommend checking a digoxin level and follow-up appointment. 2.  COPD exacerbation.  Patient was on Solu-Medrol during the hospital course and will be switched over to prednisone for 1 more day tomorrow.  Patient was on nebulizer  treatments here in the hospital.  Completed Zithromax.  Patient will go back on her Symbicort and albuterol as outpatient. 3.  Acute diastolic congestive heart failure secondary to rapid atrial fibrillation.  On Toprol-XL and oral Lasix.  Recommend checking a BMP and follow-up appointment 4.  Hyperlipidemia unspecified on atorvastatin 5.  Essential hypertension on Cardizem CD and Toprol-XL.  Other medications were held. 6.  Depression on fluoxetine 7.  Patient did well with physical therapy  DISCHARGE CONDITIONS:   Satisfactory  CONSULTS OBTAINED:  Treatment Team:  Antonieta IbaGollan, Timothy J, MD  DRUG ALLERGIES:   Allergies  Allergen Reactions   Codeine Other (See Comments)    weakness    DISCHARGE MEDICATIONS:   Allergies as of 09/26/2020       Reactions   Codeine Other (See Comments)   weakness        Medication List     STOP taking these medications    hydrochlorothiazide 12.5 MG tablet Commonly known as: HYDRODIURIL   ibuprofen 200 MG tablet Commonly known as: ADVIL       TAKE these medications    albuterol 108 (90 Base) MCG/ACT inhaler Commonly known as: VENTOLIN HFA Inhale 2 puffs into the lungs every 4 (four) hours as needed.   apixaban 5 MG Tabs tablet Commonly known as: ELIQUIS Take 1 tablet (5 mg total) by mouth 2 (two) times daily.   ascorbic acid 500 MG tablet Commonly known as: VITAMIN C Take 1,000 mg by mouth 2 (two) times daily.   atorvastatin 20 MG tablet  Commonly known as: LIPITOR Take 20 mg by mouth daily.   digoxin 0.25 MG tablet Commonly known as: LANOXIN Take 1 tablet (0.25 mg total) by mouth daily. Start taking on: September 27, 2020   diltiazem 180 MG 24 hr capsule Commonly known as: CARDIZEM CD Take 1 capsule (180 mg total) by mouth daily. Start taking on: September 27, 2020   FLUoxetine 40 MG capsule Commonly known as: PROZAC Take 40 mg by mouth daily.   furosemide 40 MG tablet Commonly known as: LASIX Take 1 tablet (40 mg  total) by mouth daily. Start taking on: September 27, 2020   metoprolol succinate 50 MG 24 hr tablet Commonly known as: TOPROL-XL Take 1 tablet (50 mg total) by mouth 2 (two) times daily. Take with or immediately following a meal.   ONE-A-DAY WOMENS 50+ PO Take 1 tablet by mouth daily.   pantoprazole 40 MG tablet Commonly known as: PROTONIX Take 40 mg by mouth daily.   predniSONE 20 MG tablet Commonly known as: DELTASONE 2 tabs po daily for one more day Start taking on: September 27, 2020   Symbicort 160-4.5 MCG/ACT inhaler Generic drug: budesonide-formoterol Inhale 1 puff into the lungs 2 (two) times daily in the am and at bedtime..   vitamin E 180 MG (400 UNITS) capsule Generic drug: vitamin E Take 400 Units by mouth 2 (two) times daily.         DISCHARGE INSTRUCTIONS:   Follow-up PMD 5 days Follow-up cardiology Follow-up CHF clinic  If you experience worsening of your admission symptoms, develop shortness of breath, life threatening emergency, suicidal or homicidal thoughts you must seek medical attention immediately by calling 911 or calling your MD immediately  if symptoms less severe.  You Must read complete instructions/literature along with all the possible adverse reactions/side effects for all the Medicines you take and that have been prescribed to you. Take any new Medicines after you have completely understood and accept all the possible adverse reactions/side effects.   Please note  You were cared for by a hospitalist during your hospital stay. If you have any questions about your discharge medications or the care you received while you were in the hospital after you are discharged, you can call the unit and asked to speak with the hospitalist on call if the hospitalist that took care of you is not available. Once you are discharged, your primary care physician will handle any further medical issues. Please note that NO REFILLS for any discharge medications will be  authorized once you are discharged, as it is imperative that you return to your primary care physician (or establish a relationship with a primary care physician if you do not have one) for your aftercare needs so that they can reassess your need for medications and monitor your lab values.    Today   CHIEF COMPLAINT:   Chief Complaint  Patient presents with   Shortness of Breath    HISTORY OF PRESENT ILLNESS:  Angela Singh  is a 70 y.o. female came in with shortness of breath   VITAL SIGNS:  Blood pressure 123/66, pulse 81, temperature 98.4 F (36.9 C), temperature source Oral, resp. rate 18, height 5\' 4"  (1.626 m), weight 55.9 kg, SpO2 92 %.  I/O:   Intake/Output Summary (Last 24 hours) at 09/26/2020 1720 Last data filed at 09/26/2020 1330 Gross per 24 hour  Intake 960 ml  Output --  Net 960 ml    PHYSICAL EXAMINATION:  GENERAL:  70 y.o.-year-old patient  lying in the bed with no acute distress.  EYES: Pupils equal, round, reactive to light and accommodation. No scleral icterus. Extraocular muscles intact.  HEENT: Head atraumatic, normocephalic. Oropharynx and nasopharynx clear.  LUNGS: decreased breath sounds bilaterally, no wheezing, rales,rhonchi or crepitation. No use of accessory muscles of respiration.  CARDIOVASCULAR: S1, S2 irregularly irregular. No murmurs, rubs, or gallops.  ABDOMEN: Soft, non-tender, non-distended.  EXTREMITIES: trace pedal edema.  NEUROLOGIC: Cranial nerves II through XII are intact. Muscle strength 5/5 in all extremities. Sensation intact. Gait not checked.  PSYCHIATRIC: The patient is alert and oriented x 3.  SKIN: No obvious rash, lesion, or ulcer.   DATA REVIEW:   CBC Recent Labs  Lab 09/26/20 0346  WBC 9.8  HGB 12.5  HCT 36.2  PLT 172    Chemistries  Recent Labs  Lab 09/21/20 2055 09/22/20 0543 09/25/20 0526 09/26/20 0346  NA 136   < > 136 138  K 3.7   < > 4.0 3.5  CL 103   < > 98 99  CO2 26   < > 31 30  GLUCOSE 147*    < > 140* 109*  BUN 9   < > 25* 34*  CREATININE 0.67   < > 0.80 0.78  CALCIUM 8.1*   < > 8.9 8.7*  MG 1.9   < > 2.2  --   AST 58*  --   --   --   ALT 43  --   --   --   ALKPHOS 74  --   --   --   BILITOT 0.9  --   --   --    < > = values in this interval not displayed.      Microbiology Results  Results for orders placed or performed during the hospital encounter of 09/21/20  Resp Panel by RT-PCR (Flu A&B, Covid) Nasopharyngeal Swab     Status: None   Collection Time: 09/21/20  8:55 PM   Specimen: Nasopharyngeal Swab; Nasopharyngeal(NP) swabs in vial transport medium  Result Value Ref Range Status   SARS Coronavirus 2 by RT PCR NEGATIVE NEGATIVE Final    Comment: (NOTE) SARS-CoV-2 target nucleic acids are NOT DETECTED.  The SARS-CoV-2 RNA is generally detectable in upper respiratory specimens during the acute phase of infection. The lowest concentration of SARS-CoV-2 viral copies this assay can detect is 138 copies/mL. A negative result does not preclude SARS-Cov-2 infection and should not be used as the sole basis for treatment or other patient management decisions. A negative result may occur with  improper specimen collection/handling, submission of specimen other than nasopharyngeal swab, presence of viral mutation(s) within the areas targeted by this assay, and inadequate number of viral copies(<138 copies/mL). A negative result must be combined with clinical observations, patient history, and epidemiological information. The expected result is Negative.  Fact Sheet for Patients:  BloggerCourse.com  Fact Sheet for Healthcare Providers:  SeriousBroker.it  This test is no t yet approved or cleared by the [place] FDA and  has been authorized for detection and/or diagnosis of SARS-CoV-2 by FDA under an Emergency Use Authorization (EUA). This EUA will remain  in effect (meaning this test can be used) for the duration  of the COVID-19 declaration under Section 564(b)(1) of the Act, 21 U.S.C.section 360bbb-3(b)(1), unless the authorization is terminated  or revoked sooner.       Influenza A by PCR NEGATIVE NEGATIVE Final   Influenza B by PCR NEGATIVE NEGATIVE Final  Comment: (NOTE) The Xpert Xpress SARS-CoV-2/FLU/RSV plus assay is intended as an aid in the diagnosis of influenza from Nasopharyngeal swab specimens and should not be used as a sole basis for treatment. Nasal washings and aspirates are unacceptable for Xpert Xpress SARS-CoV-2/FLU/RSV testing.  Fact Sheet for Patients: BloggerCourse.com  Fact Sheet for Healthcare Providers: SeriousBroker.it  This test is not yet approved or cleared by the [place] FDA and has been authorized for detection and/or diagnosis of SARS-CoV-2 by FDA under an Emergency Use Authorization (EUA). This EUA will remain in effect (meaning this test can be used) for the duration of the COVID-19 declaration under Section 564(b)(1) of the Act, 21 U.S.C. section 360bbb-3(b)(1), unless the authorization is terminated or revoked.  Performed at [facility], [address] [place] [place], [place] [address]     RADIOLOGY:  ECHO TEE  Result Date: 09/25/2020    TRANSESOPHOGEAL ECHO REPORT   Patient Name:   ELOWYN RAUPP Date of Exam: 09/25/2020 Medical Rec #:  [id number]      Height:       64.0 in Accession #:    [id number]     Weight:       123.2 lb Date of Birth:  08-Jun-1950      BSA:          1.592 m Patient Age:    69 years       BP:           108/67 mmHg Patient Gender: F              HR:           114 bpm. Exam Location:  [facility] Procedure: Transesophageal Echo, Color Doppler, Cardiac Doppler and Saline            Contrast Bubble Study Indications:     Not listed on TEE check-in sheet  History:         Patient has prior history of Echocardiogram examinations, most                  recent 09/22/2020. COPD; Risk  Factors:Hypertension. Tobacco                  abuse.  Sonographer:     Cristela Blue Referring Phys:  [id number] CHRISTOPHER RONALD BERGE Diagnosing Phys: Lorine Bears MD PROCEDURE: The transesophogeal probe was passed without difficulty through the esophogus of the patient. Local oropharyngeal anesthetic was provided with Benzocaine spray and viscous lidocaine. Sedation performed by different physician. The patient was monitored while under deep sedation. Image quality was good. The patient's vital signs; including heart rate, blood pressure, and oxygen saturation; remained stable throughout the procedure. The patient developed no complications during the procedure. Cardioversion was not performed due to presense of small LAA thrombus. IMPRESSIONS  1. Left ventricular ejection fraction, by estimation, is 55 to 60%. The left ventricle has normal function. The left ventricle has no regional wall motion abnormalities.  2. Right ventricular systolic function is normal. The right ventricular size is normal. There is normal pulmonary artery systolic pressure.  3. Left atrial size was mildly dilated. A left atrial/left atrial appendage thrombus was detected. Small mobile thrombus in mid LAA  4. Right atrial size was mildly dilated.  5. The mitral valve is normal in structure. Mild mitral valve regurgitation. No evidence of mitral stenosis.  6. Tricuspid valve regurgitation is mild to moderate.  7. The aortic valve is tricuspid. Aortic valve regurgitation is not visualized. No  aortic stenosis is present.  8. There is Moderate (Grade III) plaque involving the descending aorta.  9. Agitated saline contrast bubble study was negative, with no evidence of any interatrial shunt. FINDINGS  Left Ventricle: Left ventricular ejection fraction, by estimation, is 55 to 60%. The left ventricle has normal function. The left ventricle has no regional wall motion abnormalities. The left ventricular internal cavity size was normal in size. There  is  no left ventricular hypertrophy. Right Ventricle: The right ventricular size is normal. No increase in right ventricular wall thickness. Right ventricular systolic function is normal. There is normal pulmonary artery systolic pressure. The tricuspid regurgitant velocity is 2.60 m/s, and  with an assumed right atrial pressure of 5 mmHg, the estimated right ventricular systolic pressure is 32.0 mmHg. Left Atrium: Small mobile thrombus in mid LAA. Left atrial size was mildly dilated. A left atrial/left atrial appendage thrombus was detected. Right Atrium: Right atrial size was mildly dilated. Pericardium: There is no evidence of pericardial effusion. Mitral Valve: The mitral valve is normal in structure. Mild mitral valve regurgitation. No evidence of mitral valve stenosis. Tricuspid Valve: The tricuspid valve is normal in structure. Tricuspid valve regurgitation is mild to moderate. No evidence of tricuspid stenosis. Aortic Valve: The aortic valve is tricuspid. Aortic valve regurgitation is not visualized. No aortic stenosis is present. Pulmonic Valve: The pulmonic valve was normal in structure. Pulmonic valve regurgitation is not visualized. No evidence of pulmonic stenosis. Aorta: The aortic root is normal in size and structure. There is moderate (Grade III) plaque involving the descending aorta. Venous: The inferior vena cava was not well visualized. IAS/Shunts: No atrial level shunt detected by color flow Doppler. Agitated saline contrast was given intravenously to evaluate for intracardiac shunting. Agitated saline contrast bubble study was negative, with no evidence of any interatrial shunt.  TRICUSPID VALVE TR Peak grad:   27.0 mmHg TR Vmax:        260.00 cm/s Lorine Bears MD Electronically signed by Lorine Bears MD Signature Date/Time: 09/25/2020/4:26:59 PM    Final       Management plans discussed with the patient, family and they are in agreement.  CODE STATUS:     Code Status Orders  (From  admission, onward)           Start     Ordered   09/21/20 2219  Full code  Continuous        09/21/20 2223           Code Status History     This patient has a current code status but no historical code status.       TOTAL TIME TAKING CARE OF THIS PATIENT: 34 minutes.    Alford Highland M.D on 09/26/2020 at 5:20 PM  [facility]  CC: Primary care physician; Oswaldo Conroy, MD

## 2020-09-26 NOTE — Progress Notes (Signed)
Discussed discharge instructions including medications and follow up appointments.  Encouraged patient to monitor her BP and pulse ox as well as S&S of low blood pressure and HR.  Case manager brought patient a BP monitor and pulse ox meter.     Encouraged patient to keep her follow up appointments and to call the doctor if she has any questions regarding medications

## 2020-09-26 NOTE — Progress Notes (Signed)
Progress Note  Patient Name: Angela Singh Date of Encounter: 09/26/2020  Primary Cardiologist: Mariah Milling  Subjective   Feels sleepy. No chest pain. Heart rates in the low 100s bpm at rest. With ambulation in the hallway, heart rates into the 120s bpm without symptoms. Digoxin level stable.   Inpatient Medications    Scheduled Meds:  apixaban  5 mg Oral BID   ascorbic acid  1,000 mg Oral BID   atorvastatin  20 mg Oral QHS   digoxin  0.25 mg Oral Daily   diltiazem  180 mg Oral Daily   FLUoxetine  40 mg Oral Daily   fluticasone furoate-vilanterol  1 puff Inhalation Daily   furosemide  40 mg Oral Daily   metoprolol succinate  50 mg Oral BID   multivitamin with minerals  1 tablet Oral Daily   pantoprazole  40 mg Oral Daily   predniSONE  40 mg Oral Q breakfast   Vitamin E  400 Units Oral BID   Continuous Infusions:  sodium chloride Stopped (09/23/20 2227)   PRN Meds: sodium chloride, acetaminophen, guaiFENesin-dextromethorphan, ipratropium, ondansetron (ZOFRAN) IV   Vital Signs    Vitals:   09/26/20 0029 09/26/20 0402 09/26/20 0745 09/26/20 1119  BP: 123/73 134/79 112/72 123/66  Pulse: 95 93 97 81  Resp: 16 18 18 18   Temp: 97.7 F (36.5 C) 97.7 F (36.5 C) 98.4 F (36.9 C) 98.4 F (36.9 C)  TempSrc:    Oral  SpO2: 92% 92% 92% 92%  Weight:  55.9 kg    Height:        Intake/Output Summary (Last 24 hours) at 09/26/2020 1203 Last data filed at 09/26/2020 0945 Gross per 24 hour  Intake 1020 ml  Output --  Net 1020 ml   Filed Weights   09/21/20 2042 09/24/20 0500 09/26/20 0402  Weight: 52.2 kg 55.9 kg 55.9 kg    Telemetry    Afib with ventricular rates in the 10ss mostly with brief episode into the 120s bpm - Personally Reviewed  ECG    No new tracings - Personally Reviewed  Physical Exam   GEN: No acute distress. Frail appearing.  Neck: No JVD. Cardiac: IRIR, no murmurs, rubs, or gallops.  Respiratory: Clear to auscultation bilaterally.  GI: Soft,  nontender, non-distended.   MS: No edema; No deformity. Neuro:  Alert and oriented x 3; Nonfocal.  Psych: Normal affect.  Labs    Chemistry Recent Labs  Lab 09/21/20 2055 09/22/20 0543 09/24/20 0507 09/25/20 0526 09/26/20 0346  NA 136   < > 136 136 138  K 3.7   < > 4.1 4.0 3.5  CL 103   < > 101 98 99  CO2 26   < > 27 31 30   GLUCOSE 147*   < > 128* 140* 109*  BUN 9   < > 22 25* 34*  CREATININE 0.67   < > 0.67 0.80 0.78  CALCIUM 8.1*   < > 8.9 8.9 8.7*  PROT 6.3*  --   --   --   --   ALBUMIN 3.7  --   --   --   --   AST 58*  --   --   --   --   ALT 43  --   --   --   --   ALKPHOS 74  --   --   --   --   BILITOT 0.9  --   --   --   --  GFRNONAA >60   < > >60 >60 >60  ANIONGAP 7   < > 8 7 9    < > = values in this interval not displayed.     Hematology Recent Labs  Lab 09/24/20 0507 09/25/20 0526 09/26/20 0346  WBC 11.0* 10.5 9.8  RBC 3.72* 3.82* 3.66*  HGB 12.2 12.5 12.5  HCT 36.6 37.3 36.2  MCV 98.4 97.6 98.9  MCH 32.8 32.7 34.2*  MCHC 33.3 33.5 34.5  RDW 12.6 12.5 12.5  PLT 185 180 172    Cardiac EnzymesNo results for input(s): TROPONINI in the last 168 hours. No results for input(s): TROPIPOC in the last 168 hours.   BNP Recent Labs  Lab 09/21/20 2055  BNP 547.2*     DDimer No results for input(s): DDIMER in the last 168 hours.   Radiology      Cardiac Studies   2D echo 09/22/2020: 1. Left ventricular ejection fraction, by estimation, is 60 to 65%. The  left ventricle has normal function. The left ventricle has no regional  wall motion abnormalities. Left ventricular diastolic parameters are  indeterminate. The average left  ventricular global longitudinal strain is -9.4 %. The global longitudinal  strain is abnormal.   2. Right ventricular systolic function is normal. The right ventricular  size is normal. There is mildly elevated pulmonary artery systolic  pressure. The estimated right ventricular systolic pressure is 38.2 mmHg.   3. The  mitral valve is normal in structure. Mild to moderate mitral valve  regurgitation.   4. Tricuspid valve regurgitation is moderate.   5. Left atrial size was mildly dilated.   6. Rhythm is atrial fibrillation, rate 120 bpm  __________  TEE 09/25/2020:  1. Left ventricular ejection fraction, by estimation, is 55 to 60%. The  left ventricle has normal function. The left ventricle has no regional  wall motion abnormalities.   2. Right ventricular systolic function is normal. The right ventricular  size is normal. There is normal pulmonary artery systolic pressure.   3. Left atrial size was mildly dilated. 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 Small mobile thrombus in mid LAA   4. Right atrial size was mildly dilated.   5. The mitral valve is normal in structure. Mild mitral valve  regurgitation. No evidence of mitral stenosis.   6. Tricuspid valve regurgitation is mild to moderate.   7. The aortic valve is tricuspid. Aortic valve regurgitation is not  visualized. No aortic stenosis is present.   8. There is Moderate (Grade III) plaque involving the descending aorta.   9. Agitated saline contrast bubble study was negative, with no evidence  of any interatrial shunt.   Patient Profile     70 y.o. female with history of HTN, HLD, COPD, tobacco use, and GERD who we are seeing for Afib with RVR and acute HFpEF.   Assessment & Plan    1. Afib with RVR: -Unable to perform DCCV 8/22 secondary to LAA thrombus noted on TEE, in this setting amiodarone was stopped (used secondary to difficult to control ventricular rates) -In this setting, rate control is needed for 4 weeks followed by repeat TEE, and if no thrombus is noted, DCCV as an outpatient -Rates are improved on current regimen, which will be consolidated -Transition Lopressor to Toprol XL 50 mg bid -Consolidate and titrate short-acting diltiazem to Cardizem CD 180 mg  -Eliquis 5 mg bid -CHADS2VASc at least 4 (CHF,  HTN, age x 1, sex category)  2.  Acute HFpEF: -She appears euvolemic and well compensated -Likely in the setting of the above -Lasix  3. HTN: -Blood pressure is well controlled -Continue current medications as outlines above  4. HLD: -LDL 73 -Lipitor  For questions or updates, please contact [facility] Please consult [email or web address] for contact info under Cardiology/STEMI.    Signed, Eula Listen, PA-C [facility] Pager: [phone] 09/26/2020, 12:03 PM

## 2020-09-27 ENCOUNTER — Telehealth: Payer: Self-pay | Admitting: *Deleted

## 2020-09-27 DIAGNOSIS — I4819 Other persistent atrial fibrillation: Secondary | ICD-10-CM

## 2020-09-27 NOTE — Telephone Encounter (Signed)
Attempted to call pt. No answer. Unable to leave voicemail, no vm set up.  We will try to reach pt at a later time.

## 2020-09-27 NOTE — Telephone Encounter (Signed)
-----   Message from Yvonne Kendall, MD sent at 09/26/2020 10:02 PM EDT ----- Regarding: Hospital f/u labs Hello,  Ms. Morton was d/c'ed today but I noticed that she was not given a prescription for potassium.  Could we start her on potassium chloride 20 mEq daily and have her get a BMP and digoxin level in ~1 week?  Thanks.  Thayer Ohm

## 2020-09-29 NOTE — Telephone Encounter (Signed)
2nd attempt to reach out to Angela Singh Unable to make contact No VM to leave message.   Will reach out at another time

## 2020-10-02 NOTE — Telephone Encounter (Addendum)
Attempted to reach pt. No answer at only number listed. Unable to leave vm. No voicemail set up.  No one listed on DPR, however, I did call emergency contact listed to request they have pt call our office to discuss below.  Lmtcb on emergency contact "Vickie Clark".   Letter mailed to pt after third attempt.

## 2020-10-02 NOTE — Telephone Encounter (Signed)
Patient contact Angela Singh returning call Updated patient phone number to [phone]

## 2020-10-03 MED ORDER — POTASSIUM CHLORIDE CRYS ER 20 MEQ PO TBCR: 20.0000 meq | EXTENDED_RELEASE_TABLET | Freq: Two times a day (BID) | ORAL | 0 refills | Status: DC

## 2020-10-03 MED ORDER — POTASSIUM CHLORIDE CRYS ER 20 MEQ PO TBCR: 20.0000 meq | EXTENDED_RELEASE_TABLET | Freq: Every day | ORAL | 0 refills | Status: DC

## 2020-10-03 NOTE — Addendum Note (Signed)
Addended by: Jarvis Newcomer on: 10/03/2020 11:06 AM   Modules accepted: Orders

## 2020-10-03 NOTE — Telephone Encounter (Addendum)
Spoke with the patients emergency contact Vickie. Vickie sts that the patient will need to be contacted directly at [phone].  Spoke with the patient and made her aware of Dr. Serita Kyle recommendation. Instructions were repeated several times to the patient. She understands that a prescription for potassium 20 meq will be sent to her [facility]. Patient sts that she has no transportation and no one to help her. She is trying to keep up with her medications herself and is struggling to do so. I asked if there is someone I could call on her behalf and she stated there was not.  She is going to ask her brother to pick up her prescription for her. Adv her that she will need labwork (bmp, digoxin) in 1 week after being on potassium. She will require transportation assistance.  Adv the patient that once she has the potassium on hand she should call our office so that we could assist with coordinating transportation for the 1 week labs needed. She is aware of her 10/23/20 appt with Ward Givens, NP. Adv the patient to contact our office if further assistance is needed. Provide her our offices telephone number.  She verbalized understanding to the info given

## 2020-10-04 ENCOUNTER — Telehealth: Payer: Self-pay | Admitting: Family Medicine

## 2020-10-04 NOTE — Telephone Encounter (Signed)
   ANGELO CAROLL DOB: December 16, 1950 MRN: [id number]   RIDER WAIVER AND RELEASE OF LIABILITY  For purposes of improving physical access to our facilities, [facility] is pleased to partner with third parties to provide [place] patients or other authorized individuals the option of convenient, on-demand ground transportation services (the Chiropractor") through use of the technology service that enables users to request on-demand ground transportation from independent third-party providers.  By opting to use and accept these [employer], I, the undersigned, hereby agree on behalf of myself, and on behalf of any minor child using the Science writer for whom I am the parent or legal guardian, as follows:  Science writer provided to me are provided by independent third-party transportation providers who are not [employer] or employees and who are unaffiliated with [employer]. [place] is neither a transportation carrier nor a common or public carrier. [facility] has no control over the quality or safety of the transportation that occurs as a result of the [employer]. [facility] cannot guarantee that any third-party transportation provider will complete any arranged transportation service. [facility] makes no representation, warranty, or guarantee regarding the reliability, timeliness, quality, safety, suitability, or availability of any of the Transport Services or that they will be error free. I fully understand that traveling by vehicle involves risks and dangers of serious bodily injury, including permanent disability, paralysis, and death. I agree, on behalf of myself and on behalf of any minor child using the Transport Services for whom I am the parent or legal guardian, that the entire risk arising out of my use of the [employer] remains solely with me, to the maximum extent permitted under applicable law. The [employer] are provided "as  is" and "as available." [facility] disclaims all representations and warranties, express, implied or statutory, not expressly set out in these terms, including the implied warranties of merchantability and fitness for a particular purpose. I hereby waive and release [facility], its agents, employees, officers, directors, representatives, insurers, attorneys, assigns, successors, subsidiaries, and affiliates from any and all past, present, or future claims, demands, liabilities, actions, causes of action, or suits of any kind directly or indirectly arising from acceptance and use of the [employer]. I further waive and release [facility] and its affiliates from all present and future liability and responsibility for any injury or death to persons or damages to property caused by or related to the use of the [employer]. I have read this Waiver and Release of Liability, and I understand the terms used in it and their legal significance. This Waiver is freely and voluntarily given with the understanding that my right (as well as the right of any minor child for whom I am the parent or legal guardian using the [employer]) to legal recourse against [facility] in connection with the [employer] is knowingly surrendered in return for use of these services.   I attest that I read the consent document to Oswaldo Milian, gave Ms. Boulter the opportunity to ask questions and answered the questions asked (if any). I affirm that Oswaldo Milian then provided consent for she's participation in this program.     Launa Grill

## 2020-10-06 NOTE — Telephone Encounter (Signed)
I called and spoke with the patient to inquire if she was able to pick up/ have someone pick up her potassium RX. She advised she was able to get this and started this ~ Tuesday this week (8/30).  I advised her that we needed to set her up for a lab appointment next week to follow up on her BMP/ Digoxin level. I offered to schedule this for her today. When offering there was a long pause on the phone and the patient's said "I don't know." I inquired if she was feeling very overwhelmed right now and she advised that she was.  I advised her that we could call her back on Tuesday next week and we can work on setting up [employer] for her [phone].  I advised that it looks like [employer] had already reached out to her on 8/31 to go over the [employer] & Liability form, but she could not recall if the took place or not.  I advised her that we will call her back on Tuesday next week to try to arrange for her to come back for the lab appointment next week. I advised her her blood will need to be checked to insure she is on the correct dose of potassium & digoxin.  The patient currently voices understanding and is agreeable with a call back next week.

## 2020-10-10 NOTE — Telephone Encounter (Signed)
Called patient back and scheduled her for tomorrow 10/11/20 at 1100 for her Dig level and BMP. Patient stated that she does not need transportation help, she will be able to get herself here.

## 2020-10-11 ENCOUNTER — Other Ambulatory Visit (INDEPENDENT_AMBULATORY_CARE_PROVIDER_SITE_OTHER): Payer: Medicare HMO

## 2020-10-11 ENCOUNTER — Other Ambulatory Visit: Payer: Self-pay

## 2020-10-11 DIAGNOSIS — I4819 Other persistent atrial fibrillation: Secondary | ICD-10-CM

## 2020-10-11 DIAGNOSIS — Z1231 Encounter for screening mammogram for malignant neoplasm of breast: Secondary | ICD-10-CM

## 2020-10-12 ENCOUNTER — Telehealth: Payer: Self-pay | Admitting: Internal Medicine

## 2020-10-12 ENCOUNTER — Other Ambulatory Visit: Payer: Self-pay

## 2020-10-12 ENCOUNTER — Emergency Department: Payer: Medicare HMO

## 2020-10-12 ENCOUNTER — Observation Stay
Admission: EM | Admit: 2020-10-12 | Discharge: 2020-10-13 | Disposition: A | Discharge status: Home / Self Care | Payer: Medicare HMO | Attending: Internal Medicine | Admitting: Internal Medicine

## 2020-10-12 DIAGNOSIS — T460X1A Poisoning by cardiac-stimulant glycosides and drugs of similar action, accidental (unintentional), initial encounter: Secondary | ICD-10-CM | POA: Diagnosis not present

## 2020-10-12 DIAGNOSIS — E785 Hyperlipidemia, unspecified: Secondary | ICD-10-CM

## 2020-10-12 DIAGNOSIS — F17210 Nicotine dependence, cigarettes, uncomplicated: Secondary | ICD-10-CM | POA: Insufficient documentation

## 2020-10-12 DIAGNOSIS — K219 Gastro-esophageal reflux disease without esophagitis: Secondary | ICD-10-CM

## 2020-10-12 DIAGNOSIS — I10 Essential (primary) hypertension: Secondary | ICD-10-CM | POA: Diagnosis not present

## 2020-10-12 DIAGNOSIS — Z79899 Other long term (current) drug therapy: Secondary | ICD-10-CM | POA: Diagnosis not present

## 2020-10-12 DIAGNOSIS — I5032 Chronic diastolic (congestive) heart failure: Secondary | ICD-10-CM

## 2020-10-12 DIAGNOSIS — Z720 Tobacco use: Secondary | ICD-10-CM | POA: Diagnosis not present

## 2020-10-12 DIAGNOSIS — I4819 Other persistent atrial fibrillation: Secondary | ICD-10-CM

## 2020-10-12 DIAGNOSIS — I110 Hypertensive heart disease with heart failure: Secondary | ICD-10-CM | POA: Diagnosis not present

## 2020-10-12 DIAGNOSIS — Z7901 Long term (current) use of anticoagulants: Secondary | ICD-10-CM | POA: Insufficient documentation

## 2020-10-12 DIAGNOSIS — R7989 Other specified abnormal findings of blood chemistry: Secondary | ICD-10-CM | POA: Diagnosis present

## 2020-10-12 DIAGNOSIS — R531 Weakness: Secondary | ICD-10-CM | POA: Diagnosis present

## 2020-10-12 DIAGNOSIS — T460X5A Adverse effect of cardiac-stimulant glycosides and drugs of similar action, initial encounter: Secondary | ICD-10-CM | POA: Insufficient documentation

## 2020-10-12 DIAGNOSIS — E876 Hypokalemia: Secondary | ICD-10-CM | POA: Diagnosis not present

## 2020-10-12 DIAGNOSIS — R001 Bradycardia, unspecified: Secondary | ICD-10-CM

## 2020-10-12 DIAGNOSIS — R778 Other specified abnormalities of plasma proteins: Secondary | ICD-10-CM | POA: Insufficient documentation

## 2020-10-12 DIAGNOSIS — F32A Depression, unspecified: Secondary | ICD-10-CM | POA: Diagnosis present

## 2020-10-12 DIAGNOSIS — Z20822 Contact with and (suspected) exposure to covid-19: Secondary | ICD-10-CM | POA: Diagnosis not present

## 2020-10-12 DIAGNOSIS — J449 Chronic obstructive pulmonary disease, unspecified: Secondary | ICD-10-CM

## 2020-10-12 LAB — BASIC METABOLIC PANEL
BUN/Creatinine Ratio: 21 (ref 12–28)
BUN: 16 mg/dL (ref 8–27)
CO2: 34 mmol/L — ABNORMAL HIGH (ref 20–29)
Calcium: 8.5 mg/dL — ABNORMAL LOW (ref 8.7–10.3)
Chloride: 87 mmol/L — ABNORMAL LOW (ref 96–106)
Creatinine, Ser: 0.77 mg/dL (ref 0.57–1.00)
Glucose: 113 mg/dL — ABNORMAL HIGH (ref 65–99)
Potassium: 3.4 mmol/L — ABNORMAL LOW (ref 3.5–5.2)
Sodium: 133 mmol/L — ABNORMAL LOW (ref 134–144)
eGFR: 84 mL/min/{1.73_m2} (ref >59)

## 2020-10-12 LAB — TROPONIN I (HIGH SENSITIVITY)
Troponin I (High Sensitivity): 29 ng/L — ABNORMAL HIGH (ref <18)
Troponin I (High Sensitivity): 26 ng/L — ABNORMAL HIGH (ref <18)
Troponin I (High Sensitivity): 25 ng/L — ABNORMAL HIGH (ref <18)

## 2020-10-12 LAB — CBC
HCT: 43.1 % (ref 36.0–46.0)
Hemoglobin: 15.7 g/dL — ABNORMAL HIGH (ref 12.0–15.0)
MCH: 33.9 pg (ref 26.0–34.0)
MCHC: 36.4 g/dL — ABNORMAL HIGH (ref 30.0–36.0)
MCV: 93.1 fL (ref 80.0–100.0)
Platelets: 187 10*3/uL (ref 150–400)
RBC: 4.63 MIL/uL (ref 3.87–5.11)
RDW: 11.9 % (ref 11.5–15.5)
WBC: 5.3 10*3/uL (ref 4.0–10.5)
nRBC: 0 % (ref 0.0–0.2)

## 2020-10-12 LAB — COMPREHENSIVE METABOLIC PANEL
ALT: 84 U/L — ABNORMAL HIGH (ref 0–44)
AST: 63 U/L — ABNORMAL HIGH (ref 15–41)
Albumin: 3.5 g/dL (ref 3.5–5.0)
Alkaline Phosphatase: 74 U/L (ref 38–126)
Anion gap: 11 (ref 5–15)
BUN: 15 mg/dL (ref 8–23)
CO2: 33 mmol/L — ABNORMAL HIGH (ref 22–32)
Calcium: 8.5 mg/dL — ABNORMAL LOW (ref 8.9–10.3)
Chloride: 91 mmol/L — ABNORMAL LOW (ref 98–111)
Creatinine, Ser: 0.78 mg/dL (ref 0.44–1.00)
GFR, Estimated: >60 mL/min (ref >60)
Glucose, Bld: 112 mg/dL — ABNORMAL HIGH (ref 70–99)
Potassium: 3.3 mmol/L — ABNORMAL LOW (ref 3.5–5.1)
Sodium: 135 mmol/L (ref 135–145)
Total Bilirubin: 0.9 mg/dL (ref 0.3–1.2)
Total Protein: 6.7 g/dL (ref 6.5–8.1)

## 2020-10-12 LAB — DIGOXIN LEVEL
Digoxin Level: 1.6 ng/mL (ref 0.8–2.0)
Digoxin, Serum: 2.7 ng/mL (ref 0.5–0.9)

## 2020-10-12 LAB — BRAIN NATRIURETIC PEPTIDE: B Natriuretic Peptide: 1062.8 pg/mL — ABNORMAL HIGH (ref 0.0–100.0)

## 2020-10-12 LAB — MAGNESIUM: Magnesium: 2 mg/dL (ref 1.7–2.4)

## 2020-10-12 MED ORDER — ACETAMINOPHEN 325 MG PO TABS: 650.0000 mg | ORAL_TABLET | Freq: Four times a day (QID) | ORAL | Status: DC | PRN

## 2020-10-12 MED ORDER — ONDANSETRON HCL 4 MG/2ML IJ SOLN: 4.0000 mg | Freq: Three times a day (TID) | INTRAMUSCULAR | Status: DC | PRN

## 2020-10-12 MED ORDER — SODIUM CHLORIDE 0.9% FLUSH
10.0000 mL | Freq: Two times a day (BID) | INTRAVENOUS | Status: DC
  Administered 2020-10-12 – 2020-10-13 (×2): 10 mL via INTRAVENOUS

## 2020-10-12 MED ORDER — POTASSIUM CHLORIDE CRYS ER 20 MEQ PO TBCR
40.0000 meq | EXTENDED_RELEASE_TABLET | Freq: Once | ORAL | Status: AC
  Administered 2020-10-12: 40 meq via ORAL
  Filled 2020-10-12: qty 4

## 2020-10-12 MED ORDER — HYDRALAZINE HCL 20 MG/ML IJ SOLN: 5.0000 mg | INTRAMUSCULAR | Status: DC | PRN

## 2020-10-12 MED ORDER — NICOTINE 21 MG/24HR TD PT24
21.0000 mg | MEDICATED_PATCH | Freq: Every day | TRANSDERMAL | Status: DC
  Filled 2020-10-12: qty 1

## 2020-10-12 NOTE — Telephone Encounter (Signed)
Critical digoxin level noted on labs drawn yesterday (2.7).  Potassium also a little low at 3.4.  I have spoken with the patient, who reports that she actually feels a little better than when she left the hospital.  She denies palpitations, chest pain, and shortness of breath.  She notes some lightheadedness from time to time.  I have advised her to stop taking digoxin immediately.  I will have our office schedule her to be seen today for EKG.  She will also need potassium repletion, though I will defer this to the office visit today.  Yvonne Kendall, MD [facility]

## 2020-10-12 NOTE — H&P (Signed)
History and Physical    Angela Singh OEH:[id number] DOB: July 22, 1950 DOA: 10/12/2020  Referring MD/NP/PA:   PCP: Oswaldo Conroy, MD   Patient coming from:  The patient is coming from home.  At baseline, pt is independent for most of ADL.        Chief Complaint: Weakness and abnormal digoxin level  HPI: Angela Singh is a 70 y.o. female with medical history significant of hypertension, hyperlipidemia, COPD, GERD, depression, tobacco abuse, dCHF, atrial fibrillation on Eliquis and digoxin, who presents with weakness and abnormal digoxin level.  Pt was recently hospitalized from 8/18-8/23 due to acute dCHF and A. fib with RVR.  Patient is on metoprolol succinate 50 twice daily, diltiazem extended release 180 and digoxin 0.25 daily at the time of discharge. Pt had test for follow-up digoxin level as outpatient, and was found to be elevated at 2.7.  She states that she had transient blue-colored vision yesterday, today no vision change.  Denies chest pain, cough, shortness of breath.  No nausea vomiting, diarrhea or abdominal pain.  She has a generalized weakness and fatigue.  No unilateral numbness or tingling in extremities.  No symptoms of UTI  ED Course: pt was found to have WBC 5.3, normal digoxin level 1.6 today, potassium 3.3, renal function okay, temperature normal, blood pressure 109/49, heart rate 54-58, RR 28, 18, oxygen sat 94% on 2 L oxygen initially, currently 95% on room air.  Chest x-ray negative.  Patient is placed on progressive bed for position, Dr. Mariah Milling for cardiology is consulted.   Review of Systems:   General: no fevers, chills, no body weight gain, has fatigue HEENT: no blurry vision, hearing changes or sore throat Respiratory: no dyspnea, coughing, wheezing CV: no chest pain, no palpitations GI: no nausea, vomiting, abdominal pain, diarrhea, constipation GU: no dysuria, burning on urination, increased urinary frequency, hematuria  Ext: has trace leg  edema Neuro: no unilateral weakness, numbness, or tingling, no vision change or hearing loss Skin: no rash, no skin tear. MSK: No muscle spasm, no deformity, no limitation of range of movement in spin Heme: No easy bruising.  Travel history: No recent long distant travel.  Allergy:  Allergies  Allergen Reactions   Codeine Other (See Comments)    weakness    Past Medical History:  Diagnosis Date   COPD (chronic obstructive pulmonary disease) (HCC)    Essential hypertension    GERD (gastroesophageal reflux disease)    Mixed hyperlipidemia    Tobacco abuse     Past Surgical History:  Procedure Laterality Date   TEE WITHOUT CARDIOVERSION N/A 09/25/2020   Procedure: TRANSESOPHAGEAL ECHOCARDIOGRAM (TEE);  Surgeon: Iran Ouch, MD;  Location: [facility] ORS;  Service: Cardiovascular;  Laterality: N/A;    Social History:  reports that she has been smoking cigarettes. She has a 48.00 pack-year smoking history. She has never used smokeless tobacco. She reports that she does not currently use alcohol. She reports that she does not use drugs.  Family History:  Family History  Problem Relation Age of Onset   COPD Mother    Heart failure Father    Heart disease Sister      Prior to Admission medications   Medication Sig Start Date End Date Taking? Authorizing Provider  albuterol (VENTOLIN HFA) 108 (90 Base) MCG/ACT inhaler Inhale 2 puffs into the lungs every 4 (four) hours as needed. 08/09/20   [provider]  apixaban (ELIQUIS) 5 MG TABS tablet Take 1 tablet (5 mg  total) by mouth 2 (two) times daily. 09/26/20   Alford HighlandWieting, Richard, MD  ascorbic acid (VITAMIN C) 500 MG tablet Take 1,000 mg by mouth 2 (two) times daily.    [provider]  atorvastatin (LIPITOR) 20 MG tablet Take 20 mg by mouth daily. 08/03/20   [provider]  digoxin (LANOXIN) 0.25 MG tablet Take 1 tablet (0.25 mg total) by mouth daily. 09/27/20   Alford HighlandWieting, Richard, MD  diltiazem (CARDIZEM CD) 180  MG 24 hr capsule Take 1 capsule (180 mg total) by mouth daily. 09/27/20   Alford HighlandWieting, Richard, MD  FLUoxetine (PROZAC) 40 MG capsule Take 40 mg by mouth daily. 08/03/20   [provider]  furosemide (LASIX) 40 MG tablet Take 1 tablet (40 mg total) by mouth daily. 09/27/20   Alford HighlandWieting, Richard, MD  metoprolol succinate (TOPROL-XL) 50 MG 24 hr tablet Take 1 tablet (50 mg total) by mouth 2 (two) times daily. Take with or immediately following a meal. 09/26/20   Alford HighlandWieting, Richard, MD  Multiple Vitamins-Minerals (ONE-A-DAY WOMENS 50+ PO) Take 1 tablet by mouth daily.    [provider]  pantoprazole (PROTONIX) 40 MG tablet Take 40 mg by mouth daily. 09/11/20   [provider]  potassium chloride SA (KLOR-CON) 20 MEQ tablet Take 1 tablet (20 mEq total) by mouth daily. 10/03/20   End, Cristal Deerhristopher, MD  predniSONE (DELTASONE) 20 MG tablet 2 tabs po daily for one more day 09/27/20   Alford HighlandWieting, Richard, MD  [facility] MCG/ACT inhaler Inhale 1 puff into the lungs 2 (two) times daily in the am and at bedtime.. 08/08/20   [provider]  vitamin E 180 MG (400 UNITS) capsule Take 400 Units by mouth 2 (two) times daily.    [provider]    Physical Exam: Vitals:   10/12/20 1406 10/12/20 1430 10/12/20 1553 10/12/20 1635  BP: (!) 109/49 (!) 106/59 (!) 119/48 (!) 115/55  Pulse: (!) 58 71 67 88  Resp: 18 (!) 31 19 19   Temp:    98 F (36.7 C)  TempSrc:    Oral  SpO2: 94% 95% 95% 94%  Weight:      Height:       General: Not in acute distress HEENT:       Eyes: PERRL, EOMI, no scleral icterus.       ENT: No discharge from the ears and nose, no pharynx injection, no tonsillar enlargement.        Neck: No JVD, no bruit, no mass felt. Heme: No neck lymph node enlargement. Cardiac: S1/S2, RRR, No murmurs, No gallops or rubs. Respiratory: No rales, wheezing, rhonchi or rubs. GI: Soft, nondistended, nontender, no rebound pain, no organomegaly, BS present. GU: No  hematuria Ext: has trace leg edema bilaterally. 1+DP/PT pulse bilaterally. Musculoskeletal: No joint deformities, No joint redness or warmth, no limitation of ROM in spin. Skin: No rashes.  Neuro: Alert, oriented X3, cranial nerves II-XII grossly intact, moves all extremities normally.  Psych: Patient is not psychotic, no suicidal or hemocidal ideation.  Labs on Admission: I have personally reviewed following labs and imaging studies  CBC: Recent Labs  Lab 10/12/20 1006  WBC 5.3  HGB 15.7*  HCT 43.1  MCV 93.1  PLT 187   Basic Metabolic Panel: Recent Labs  Lab 10/11/20 1122 10/12/20 1006  NA 133* 135  K 3.4* 3.3*  CL 87* 91*  CO2 34* 33*  GLUCOSE 113* 112*  BUN 16 15  CREATININE 0.77 0.78  CALCIUM 8.5*  8.5*  MG  --  2.0   GFR: Estimated Creatinine Clearance: 50.6 mL/min (by C-G formula based on SCr of 0.78 mg/dL). Liver Function Tests: Recent Labs  Lab 10/12/20 1006  AST 63*  ALT 84*  ALKPHOS 74  BILITOT 0.9  PROT 6.7  ALBUMIN 3.5   No results for input(s): LIPASE, AMYLASE in the last 168 hours. No results for input(s): AMMONIA in the last 168 hours. Coagulation Profile: No results for input(s): INR, PROTIME in the last 168 hours. Cardiac Enzymes: No results for input(s): CKTOTAL, CKMB, CKMBINDEX, TROPONINI in the last 168 hours. BNP (last 3 results) No results for input(s): PROBNP in the last 8760 hours. HbA1C: No results for input(s): HGBA1C in the last 72 hours. CBG: No results for input(s): GLUCAP in the last 168 hours. Lipid Profile: No results for input(s): CHOL, HDL, LDLCALC, TRIG, CHOLHDL, LDLDIRECT in the last 72 hours. Thyroid Function Tests: No results for input(s): TSH, T4TOTAL, FREET4, T3FREE, THYROIDAB in the last 72 hours. Anemia Panel: No results for input(s): VITAMINB12, FOLATE, FERRITIN, TIBC, IRON, RETICCTPCT in the last 72 hours. Urine analysis: No results found for: COLORURINE, APPEARANCEUR, LABSPEC, PHURINE, GLUCOSEU, HGBUR,  BILIRUBINUR, KETONESUR, PROTEINUR, UROBILINOGEN, NITRITE, LEUKOCYTESUR Sepsis Labs: @LABRCNTIP (procalcitonin:4,lacticidven:4) )No results found for this or any previous visit (from the past 240 hour(s)).   Radiological Exams on Admission: DG Chest 2 View  Result Date: 10/12/2020 CLINICAL DATA:  Weakness EXAM: CHEST - 2 VIEW COMPARISON:  Chest x-ray dated September 21, 2020 FINDINGS: Cardiac and mediastinal contours are unchanged within normal limits. Interval resolution of bilateral interstitial opacities. Lungs are clear. Interval resolution of trace bilateral pleural effusions. No evidence pneumothorax. IMPRESSION: Lungs are clear. Interval resolution of trace bilateral pleural effusions. Electronically Signed   By: September 23, 2020 M.D.   On: 10/12/2020 13:13     EKG: I have personally reviewed.  Atrial fibrillation, ST elevation in V1-V2, T wave inversion in V3-V6, in the inferior leads  Assessment/Plan Principal Problem:   Digoxin toxicity Active Problems:   Essential hypertension   Hyperlipidemia   GERD (gastroesophageal reflux disease)   Tobacco abuse   Depression   Persistent atrial fibrillation (HCC)   COPD (chronic obstructive pulmonary disease) (HCC)   Chronic diastolic CHF (congestive heart failure) (HCC)   Hypokalemia   Elevated troponin   Digoxin toxicity: Digoxin level has been normalized from 2.7-1.6.  Patient has bradycardia and abnormal EKG with ST elevation in V1-V2 and T wave inversion in inferior leads and V3-V6.  no chest pain.  Dr. 12/12/2020 of cardiology is consulted.  -Placed on progressive bed for observation -Cardiac monitoring - hold Toprol and Cardizem  -hold digoxin - per Dr. Mariah Milling, "for signs of tachyarrhythmia, sinus node dysfunction, further malaise, GI distress, persistent hypotension , would consider Digibind"   Essential hypertension: Blood pressures are soft -Hold metoprolol and Cardizem -IV hydralazine as needed -Patient is on Lasix 40 mg daily  for CHF  Hyperlipidemia -Lipitor  GERD (gastroesophageal reflux disease) -Protonix  Tobacco abuse -Nicotine patch  Depression -Continue home medications  Persistent atrial fibrillation (HCC) -Hold the digoxin, Cardizem, metoprolol -Continue Eliquis  COPD (chronic obstructive pulmonary disease) (HCC): Stable -Bronchodilators  Chronic diastolic CHF (congestive heart failure) (HCC): 2D echo on 09/25/2020 showed EF of 55-60%.  Patient has elevated BNP 1062, but no shortness of breath currently.  Does not seem to have CHF acute exacerbation. -Continue home Lasix 40 mg daily  Hypokalemia: Potassium 3.3.  Magnesium 2.0 -Repleted potassium  Elevated troponin: Troponin level 29,  no chest pain.  Likely due to demand ischemia. -Trend troponin -Check A1c, FLP -On Lipitor       DVT ppx: on Eliquis Code Status: Full code Family Communication: not done, no family member is at bed side.    Disposition Plan:  Anticipate discharge back to previous environment Consults called:  Dr. Mariah Milling of card Admission status and  Level of care: Progressive Cardiac:     for obs    Status is: Observation  The patient remains OBS appropriate and will d/c before 2 midnights.  Dispo: The patient is from: Home              Anticipated d/c is to: Home              Patient currently is not medically stable to d/c.   Difficult to place patient No          Date of Service 10/12/2020    Lorretta Harp [facility]   If 7PM-7AM, please contact night-coverage [email or web address] 10/12/2020, 7:15 PM

## 2020-10-12 NOTE — ED Triage Notes (Signed)
Pt referred to the ED due to elevated digoxin levels 2.7 that were  taken yesterday with routine labs.

## 2020-10-12 NOTE — Progress Notes (Signed)
Attending Note Patient seen and examined, agree with detailed note above,   Patient presentation and plan discussed on rounds.   Cardiology consult placed by Dr.  Clyde Lundborg for symptomatic bradycardia, hypotension, abnormal EKG, possible digoxin toxicity  EKG lab work, chest x-ray, echocardiogram reviewed independently by myself  70 year old woman with past medical history of COPD, smoker, hypertension, GERD, recent hospital admission for atrial fibrillation with RVR Hospital course requiring steroids, nebulizers for respiratory distress Rate control with metoprolol succinate 50 twice daily diltiazem extended release 180 and digoxin 0.25 daily at the time of discharge Had follow-up digoxin as outpatient with value 2.7, referred to the emergency room as no outpatient office visits available In the ER reported feeling well for several days after her hospital discharge, then started feeling poorly with fatigue/general malaise /feeling of being unwell   EKG in the emergency room with" reversed check/reversed tic ST depressions concerning for digoxin effect, bradycardia On rounds systolic pressures in the high 90s supine position Has not had anything to eat or drink today apart from a little bit of juice this morning Heart rate after getting back in the bed from going to the bathroom in the 70s.  Pauses noted on telemetry, asymptomatic  On examination : Very thin alert oriented, no JVD, lungs clear to auscultation bilaterally, heart sounds irregularly irregular, abdomen soft nontender no significant lower extremity edema.  Musculoskeletal exam with good range of motion, neurologic exam grossly nonfocal  Lab work reviewed BNP 1060 Potassium 3.3 sodium 135 creatinine 0.78 BUN 15 WBC 5 hemoglobin 15 Digoxin 2.7 yesterday, repeat today 1.6  EKG personally reviewed by myself showing atrial fibrillation with diffuse ST T wave abnormality concerning for digoxin effect/toxicity  A/P: Atrial  fibrillation Ejection fraction 60 to 65% 3 weeks ago in the setting of atrial fibrillation Recent hospitalization August 2022 : TEE was done but unable to do cardioversion secondary to left atrial thrombus. Elevated digoxin as outpatient, with abnormal EKG noted in the emergency room Bradycardia on EKG, hypotension, general malaise starting as outpatient several days after recent discharge possibly from digoxin toxicity -Did not take digoxin dose today -Given hypotension would hold off on further metoprolol and diltiazem -Dosing of metoprolol and diltiazem may need to be adjusted based on her blood pressure -Aggressive repletion of potassium -For signs of tachyarrhythmia, sinus node dysfunction, further malaise, GI distress, persistent hypotension , would consider Digibind  Depression On fluoxetine  COPD No exacerbation this admission, recently treated with Solu-Medrol, prednisone, Z-Pak, back on Symbicort and albuterol as outpatient   Greater than 50% was spent in counseling and coordination of care with patient Total encounter time 110 minutes or more   Signed: Dossie Arbour  M.D., Ph.D. [facility]

## 2020-10-12 NOTE — ED Provider Notes (Signed)
[facility] Emergency Department Provider Note ____________________________________________   Event Date/Time   First MD Initiated Contact with Patient 10/12/20 1124     (approximate)  I have reviewed the triage vital signs and the nursing notes.   HISTORY  Chief Complaint Abnormal Lab  HPI Angela Singh is a 70 y.o. female with history of A. fib with RVR, COPD, hypertension presents to the emergency department for treatment and evaluation after being advised by cardiology that her digoxin level was too high.  Patient denies feeling confused, nausea, vomiting, abdominal pain, palpitations, or symptoms of increased heart rate.  She has felt generalized weakness but reports that this is not uncommon.  She has felt more "shaky than usual" since being discharged from the hospital.  She denies any symptom of concern at this time.      Past Medical History:  Diagnosis Date   COPD (chronic obstructive pulmonary disease) (HCC)    Essential hypertension    GERD (gastroesophageal reflux disease)    Mixed hyperlipidemia    Tobacco abuse     Patient Active Problem List   Diagnosis Date Noted   Digoxin toxicity 10/12/2020   COPD (chronic obstructive pulmonary disease) (HCC)    Chronic diastolic CHF (congestive heart failure) (HCC)    Hypokalemia    Persistent atrial fibrillation (HCC)    Weakness    Acute diastolic CHF (congestive heart failure) (HCC)    Depression    Essential hypertension 09/22/2020   Hyperlipidemia 09/22/2020   GERD (gastroesophageal reflux disease) 09/22/2020   COPD exacerbation (HCC) 09/22/2020   Tobacco abuse 09/22/2020   Atrial fibrillation with RVR (HCC) 09/21/2020    Past Surgical History:  Procedure Laterality Date   TEE WITHOUT CARDIOVERSION N/A 09/25/2020   Procedure: TRANSESOPHAGEAL ECHOCARDIOGRAM (TEE);  Surgeon: Iran Ouch, MD;  Location: [facility] ORS;  Service: Cardiovascular;  Laterality: N/A;    Prior to Admission  medications   Medication Sig Start Date End Date Taking? Authorizing Provider  albuterol (VENTOLIN HFA) 108 (90 Base) MCG/ACT inhaler Inhale 2 puffs into the lungs every 4 (four) hours as needed. 08/09/20   [provider]  apixaban (ELIQUIS) 5 MG TABS tablet Take 1 tablet (5 mg total) by mouth 2 (two) times daily. 09/26/20   Alford Highland, MD  ascorbic acid (VITAMIN C) 500 MG tablet Take 1,000 mg by mouth 2 (two) times daily.    [provider]  atorvastatin (LIPITOR) 20 MG tablet Take 20 mg by mouth daily. 08/03/20   [provider]  digoxin (LANOXIN) 0.25 MG tablet Take 1 tablet (0.25 mg total) by mouth daily. 09/27/20   Alford Highland, MD  diltiazem (CARDIZEM CD) 180 MG 24 hr capsule Take 1 capsule (180 mg total) by mouth daily. 09/27/20   Alford Highland, MD  FLUoxetine (PROZAC) 40 MG capsule Take 40 mg by mouth daily. 08/03/20   [provider]  furosemide (LASIX) 40 MG tablet Take 1 tablet (40 mg total) by mouth daily. 09/27/20   Alford Highland, MD  metoprolol succinate (TOPROL-XL) 50 MG 24 hr tablet Take 1 tablet (50 mg total) by mouth 2 (two) times daily. Take with or immediately following a meal. 09/26/20   Alford Highland, MD  Multiple Vitamins-Minerals (ONE-A-DAY WOMENS 50+ PO) Take 1 tablet by mouth daily.    [provider]  pantoprazole (PROTONIX) 40 MG tablet Take 40 mg by mouth daily. 09/11/20   [provider]  potassium chloride SA (KLOR-CON) 20 MEQ tablet Take 1  tablet (20 mEq total) by mouth daily. 10/03/20   End, Cristal Deer, MD  predniSONE (DELTASONE) 20 MG tablet 2 tabs po daily for one more day 09/27/20   Alford Highland, MD  [facility] 160-4.5 MCG/ACT inhaler Inhale 1 puff into the lungs 2 (two) times daily in the am and at bedtime.. 08/08/20   [provider]  vitamin E 180 MG (400 UNITS) capsule Take 400 Units by mouth 2 (two) times daily.    [provider]    Allergies Codeine  Family History   Problem Relation Age of Onset   COPD Mother    Heart failure Father    Heart disease Sister     Social History Social History   Tobacco Use   Smoking status: Every Day    Packs/day: 1.00    Years: 48.00    Pack years: 48.00    Types: Cigarettes   Smokeless tobacco: Never  Substance Use Topics   Alcohol use: Not Currently   Drug use: Never    Review of Systems  Constitutional: No fever/chills Eyes: No visual changes. ENT: No sore throat. Cardiovascular: Denies chest pain.  Denies palpitations Respiratory: Denies shortness of breath. Gastrointestinal: No abdominal pain.  No nausea, no vomiting.  No diarrhea.  No constipation. Genitourinary: Negative for dysuria. Musculoskeletal: Negative for back pain. Skin: Negative for rash. Neurological: Negative for headaches, focal weakness or numbness.  ____________________________________________   PHYSICAL EXAM:  VITAL SIGNS: ED Triage Vitals  Enc Vitals Group     BP 10/12/20 0958 (!) 100/53     Pulse Rate 10/12/20 0958 77     Resp 10/12/20 0958 18     Temp 10/12/20 0958 98.5 F (36.9 C)     Temp Source 10/12/20 0958 Oral     SpO2 10/12/20 0958 97 %     Weight 10/12/20 0956 105 lb (47.6 kg)     Height 10/12/20 0956 5\' 4"  (1.626 m)     Head Circumference --      Peak Flow --      Pain Score 10/12/20 0956 0     Pain Loc --      Pain Edu? --      Excl. in GC? --     Constitutional: Alert and oriented.  Overall well appearing and in no acute distress. Eyes: Conjunctivae are normal.  Head: Atraumatic. Nose: No congestion/rhinnorhea. Mouth/Throat: Mucous membranes are moist.  Oropharynx non-erythematous. Neck: No stridor.   Hematological/Lymphatic/Immunilogical: No cervical lymphadenopathy. Cardiovascular: Bradycardic.  Good peripheral circulation. Respiratory: Normal respiratory effort.  No retractions. Lungs CTAB. Gastrointestinal: Soft and nontender. No distention. No abdominal bruits. Genitourinary:   Musculoskeletal: No lower extremity tenderness nor edema.  No joint effusions. Neurologic:  Normal speech and language. No gross focal neurologic deficits are appreciated. No gait instability. Skin:  Skin is warm, dry and intact. No rash noted. Psychiatric: Mood and affect are normal. Speech and behavior are normal.  ____________________________________________   LABS (all labs ordered are listed, but only abnormal results are displayed)  Labs Reviewed  CBC - Abnormal; Notable for the following components:      Result Value   Hemoglobin 15.7 (*)    MCHC 36.4 (*)    All other components within normal limits  COMPREHENSIVE METABOLIC PANEL - Abnormal; Notable for the following components:   Potassium 3.3 (*)    Chloride 91 (*)    CO2 33 (*)    Glucose, Bld 112 (*)    Calcium 8.5 (*)  AST 63 (*)    ALT 84 (*)    All other components within normal limits  SARS CORONAVIRUS 2 (TAT 6-24 HRS)  DIGOXIN LEVEL  MAGNESIUM  BRAIN NATRIURETIC PEPTIDE  TROPONIN I (HIGH SENSITIVITY)   ____________________________________________  EKG  ED ECG REPORT I, Elica Almas, FNP-BC personally viewed and interpreted this ECG.   Date: 10/12/2020  EKG Time: 1011  Rate: 54  Rhythm: controlled a-fib  Axis: normal  Intervals:none  ST&T Change: Diffuse t wave depression  ____________________________________________  RADIOLOGY  ED MD interpretation:    Feels that the EKG changes are in her left ventricular hypertrophy with some strain versus digoxin effect.  Plan will be to get Dr. Laurelyn SickleEnds opinion as well since he has also been involved in her care.    ----------------------------------------- 2:54 PM on 10/12/2020 ----------------------------------------- Dr. Okey DupreEnd would like to defer decision to Dr. Mariah MillingGollan.  Plan will be to keep her in observation in hopes to get her heart rate regulated and medication adjusted.  Plan discussed with the patient who is agreeable.  Hospitalist consult  requested.  I, Kem Boroughsari Bradely Rudin, personally viewed and evaluated these images (plain radiographs) as part of my medical decision making, as well as reviewing the written report by the radiologist.  Official radiology report(s): DG Chest 2 View  Result Date: 10/12/2020 CLINICAL DATA:  Weakness EXAM: CHEST - 2 VIEW COMPARISON:  Chest x-ray dated September 21, 2020 FINDINGS: Cardiac and mediastinal contours are unchanged within normal limits. Interval resolution of bilateral interstitial opacities. Lungs are clear. Interval resolution of trace bilateral pleural effusions. No evidence pneumothorax. IMPRESSION: Lungs are clear. Interval resolution of trace bilateral pleural effusions. Electronically Signed   By: Allegra LaiLeah  Strickland M.D.   On: 10/12/2020 13:13    ____________________________________________   PROCEDURES  Procedure(s) performed (including Critical Care):  Procedures  ____________________________________________   INITIAL IMPRESSION / ASSESSMENT AND PLAN     70 year old female presenting to the emergency department by request of cardiology due to high digoxin level drawn in the office yesterday.  See HPI for further details.  Patient is asymptomatic.  Patient was recently admitted for A. fib with RVR and had a TEE on the 22nd.  No cardioversion completed due to presence of presence of left atrial thrombus.  She was discharged home on Eliquis, digoxin, Toprol-XL, and Cardizem CD.  ED COURSE  According to labs drawn here today her digoxin is 1.6 and is within the normal range for this testing facility.  Her potassium is 3.3.  EKG showing a sinus bradycardic rhythm and without PVCs, atrial tach, AV block, or other atrial conduction abnormalities.  She does have diffuse T wave depression.  Other than the EKG  from 09/21/20 showing a-fib with RVR, I am unable to see previous or more recent tracings for comparison.   Plan will be to speak with cardiology in hopes that medication can be  adjusted and she can be discharged home.   ----------------------------------------- 2:48 PM on 10/12/2020 ----------------------------------------- Consulted with Dr. Mariah MillingGollan. Plan will be to admit for observation and make adjustments to medications. Patient is aware and agreeable to the plan. Will admit via hospitalist service.  ----------------------------------------- 3:27 PM on 10/12/2020 ----------------------------------------- Patient accepted for observation.    ___________________________________________   FINAL CLINICAL IMPRESSION(S) / ED DIAGNOSES  Final diagnoses:  Weakness  Adverse effect of digoxin, initial encounter     ED Discharge Orders     None        Angela MilianDarcy J Hoopes was evaluated in  Emergency Department on 10/12/2020 for the symptoms described in the history of present illness. She was evaluated in the context of the global COVID-19 pandemic, which necessitated consideration that the patient might be at risk for infection with the SARS-CoV-2 virus that causes COVID-19. Institutional protocols and algorithms that pertain to the evaluation of patients at risk for COVID-19 are in a state of rapid change based on information released by regulatory bodies including the CDC and federal and state organizations. These policies and algorithms were followed during the patient's care in the ED.   Note:  This document was prepared using Dragon voice recognition software and may include unintentional dictation errors.    Chinita Pester, FNP 10/12/20 1528    Gilles Chiquito, MD 10/12/20 [phone]

## 2020-10-12 NOTE — Consult Note (Signed)
Cardiology Consultation:   Patient ID: Angela MilianDarcy J Nyborg; [id number]; 21-Oct-1950   Admit date: 10/12/2020 Date of Consult: 10/12/2020  Primary Care Provider: Oswaldo ConroyBender, Abby Daneele, MD Primary Cardiologist: Mariah MillingGollan Primary Electrophysiologist:  None   Patient Profile:   Angela Singh is a 70 y.o. female with a hx of recently diagnosed A. fib, HTN, HLD, COPD, tobacco use, and GERD who is being seen today for the evaluation of elevated digoxin at the request of Dr. Clyde LundborgNiu.  History of Present Illness:   Angela Singh was recently admitted to the hospital in 09/2020 with progressive dyspnea, productive cough, and congestion.  She was diagnosed with new onset A. fib with RVR and acute HFpEF.  High-sensitivity troponin negative x2.  2D surface echo on 09/22/2020 showed an EF of 60 to 65%, no regional wall motion abnormalities, indeterminate LV diastolic function parameters, normal RV systolic function and ventricular cavity size, mildly elevated PASP estimated at 38.2 mmHg, mild to moderate mitral regurgitation, moderate tricuspid regurgitation, mildly dilated left atrium measuring 38 mm.  Rhythm was A. fib with RVR with a ventricular rate in the 120s bpm.  With difficult to control ventricular rates she was placed on diltiazem, bisoprolol, digoxin, and IV amiodarone with plans for TEE guided DCCV.  She underwent TEE on 8/22 which demonstrated a left atrial appendage thrombus leading to the cancellation of DCCV and discontinuation of amiodarone with recommendation to pursue rate control until she was adequately anticoagulated followed by repeat TEE guided DCCV as an outpatient.  She was discharged on Eliquis, digoxin 0.25 mg daily, Cardizem CD 180 mg daily, Toprol-XL 50 mg twice daily, and furosemide 40 mg daily.  Discharge his oxygen level 0.7.  Outpatient labs obtained on 9/7 showed a critical digoxin level of 2.7, BUN 16, serum creatinine 0.77, and potassium 3.4.  In this setting, she was advised to hold  digoxin, increase KCl, and to proceed to the ED for further evaluation.  Upon arrival to [facility] ED she remained in A. fib with slow ventricular response with ventricular rates in the 50s bpm, BP has ranged from the 90s to 1 teens systolic.  She has reported increased fatigue with generalized malaise following her hospital discharge.  Her last dose of digoxin was on 9/7.  Labs in the ED were notable for digoxin level of 1.6, potassium 3.3, AST 63, ALT 84, magnesium 2.0, and BNP 1062.  In the ED she remains in A. fib with improved ventricular response with rates in the 70s to 80s bpm.  EKG shows A. fib with slow ventricular response, 54 bpm, diffuse ST-T changes concerning for digoxin toxicity concerning for reverse tic/dali changes.    Past Medical History:  Diagnosis Date   COPD (chronic obstructive pulmonary disease) (HCC)    Essential hypertension    GERD (gastroesophageal reflux disease)    Mixed hyperlipidemia    Tobacco abuse     Past Surgical History:  Procedure Laterality Date   TEE WITHOUT CARDIOVERSION N/A 09/25/2020   Procedure: TRANSESOPHAGEAL ECHOCARDIOGRAM (TEE);  Surgeon: Iran OuchArida, Muhammad A, MD;  Location: [facility] ORS;  Service: Cardiovascular;  Laterality: N/A;     Home Meds: Prior to Admission medications   Medication Sig Start Date End Date Taking? Authorizing Provider  albuterol (VENTOLIN HFA) 108 (90 Base) MCG/ACT inhaler Inhale 2 puffs into the lungs every 4 (four) hours as needed. 08/09/20  Yes [provider]  apixaban (ELIQUIS) 5 MG TABS tablet Take 1 tablet (5 mg total) by mouth 2 (  two) times daily. 09/26/20  Yes Wieting, Richard, MD  ascorbic acid (VITAMIN C) 500 MG tablet Take 1,000 mg by mouth 2 (two) times daily.   Yes [provider]  atorvastatin (LIPITOR) 20 MG tablet Take 20 mg by mouth daily. 08/03/20  Yes [provider]  digoxin (LANOXIN) 0.25 MG tablet Take 1 tablet (0.25 mg total) by mouth daily. 09/27/20  Yes Wieting, Richard, MD   diltiazem (CARDIZEM CD) 180 MG 24 hr capsule Take 1 capsule (180 mg total) by mouth daily. 09/27/20  Yes Wieting, Richard, MD  FLUoxetine (PROZAC) 40 MG capsule Take 40 mg by mouth daily. 08/03/20  Yes [provider]  furosemide (LASIX) 40 MG tablet Take 1 tablet (40 mg total) by mouth daily. 09/27/20  Yes Wieting, Richard, MD  metoprolol succinate (TOPROL-XL) 50 MG 24 hr tablet Take 1 tablet (50 mg total) by mouth 2 (two) times daily. Take with or immediately following a meal. 09/26/20  Yes Wieting, Richard, MD  Multiple Vitamins-Minerals (ONE-A-DAY WOMENS 50+ PO) Take 1 tablet by mouth daily.   Yes [provider]  pantoprazole (PROTONIX) 40 MG tablet Take 40 mg by mouth daily. 09/11/20  Yes [provider]  potassium chloride SA (KLOR-CON) 20 MEQ tablet Take 1 tablet (20 mEq total) by mouth daily. 10/03/20  Yes End, Cristal Deer, MD  predniSONE (DELTASONE) 20 MG tablet 2 tabs po daily for one more day 09/27/20  Yes Wieting, Richard, MD  SYMBICORT 160-4.5 MCG/ACT inhaler Inhale 1 puff into the lungs 2 (two) times daily in the am and at bedtime.. 08/08/20  Yes [provider]  vitamin E 180 MG (400 UNITS) capsule Take 400 Units by mouth 2 (two) times daily.   Yes [provider]    Inpatient Medications: Scheduled Meds:  nicotine  21 mg Transdermal Daily   potassium chloride  40 mEq Oral Once   Continuous Infusions:  PRN Meds: acetaminophen, hydrALAZINE, ondansetron (ZOFRAN) IV  Allergies:   Allergies  Allergen Reactions   Codeine Other (See Comments)    weakness    Social History:   Social History   Socioeconomic History   Marital status: Divorced    Spouse name: Not on file   Number of children: Not on file   Years of education: Not on file   Highest education level: Not on file  Occupational History   Not on file  Tobacco Use   Smoking status: Every Day    Packs/day: 1.00    Years: 48.00    Pack years: 48.00    Types: Cigarettes    Smokeless tobacco: Never  Substance and Sexual Activity   Alcohol use: Not Currently   Drug use: Never   Sexual activity: Not on file  Other Topics Concern   Not on file  Social History Narrative   Lives locally.  Does not routinely exercise.   Social Determinants of Health   Financial Resource Strain: Not on file  Food Insecurity: Not on file  Transportation Needs: Not on file  Physical Activity: Not on file  Stress: Not on file  Social Connections: Not on file  Intimate Partner Violence: Not on file     Family History:   Family History  Problem Relation Age of Onset   COPD Mother    Heart failure Father    Heart disease Sister     ROS:  Review of Systems  Constitutional:  Positive for malaise/fatigue. Negative for chills, diaphoresis, fever and weight loss.  HENT:  Negative  for congestion.   Eyes:  Negative for discharge and redness.  Respiratory:  Negative for cough, sputum production, shortness of breath and wheezing.   Cardiovascular:  Negative for chest pain, palpitations, orthopnea, claudication, leg swelling and PND.  Gastrointestinal:  Negative for abdominal pain, heartburn, nausea and vomiting.  Musculoskeletal:  Negative for falls and myalgias.  Skin:  Negative for rash.  Neurological:  Positive for weakness. Negative for dizziness, tingling, tremors, sensory change, speech change, focal weakness and loss of consciousness.  Endo/Heme/Allergies:  Does not bruise/bleed easily.  Psychiatric/Behavioral:  Negative for substance abuse. The patient is not nervous/anxious.   All other systems reviewed and are negative.    Physical Exam/Data:   Vitals:   10/12/20 1315 10/12/20 1406 10/12/20 1430 10/12/20 1553  BP:  (!) 109/49 (!) 106/59 (!) 119/48  Pulse: (!) 54 (!) 58 71 67  Resp: 13 18 (!) 31 19  Temp:      TempSrc:      SpO2: 95% 94% 95% 95%  Weight:      Height:       No intake or output data in the 24 hours ending 10/12/20 1603 Filed Weights    10/12/20 0956  Weight: 47.6 kg   Body mass index is 18.02 kg/m.   Physical Exam: General: Well developed, well nourished, in no acute distress. Head: Normocephalic, atraumatic, sclera non-icteric, no xanthomas, nares without discharge.  Neck: Negative for carotid bruits. JVD not elevated. Lungs: Clear bilaterally to auscultation without wheezes, rales, or rhonchi. Breathing is unlabored. Heart: Bradycardic, irregularly irregular with S1 S2. No murmurs, rubs, or gallops appreciated. Abdomen: Soft, non-tender, non-distended with normoactive bowel sounds. No hepatomegaly. No rebound/guarding. No obvious abdominal masses. Msk:  Strength and tone appear normal for age. Extremities: No clubbing or cyanosis. No edema. Distal pedal pulses are 2+ and equal bilaterally. Neuro: Alert and oriented X 3. No facial asymmetry. No focal deficit. Moves all extremities spontaneously. Psych:  Responds to questions appropriately with a normal affect.   EKG:  The EKG was personally reviewed and demonstrates: A. fib with slow ventricular response, 54 bpm, diffuse ST-T changes concerning for digoxin toxicity concerning for reverse tic/dali changes. Telemetry:  Telemetry was personally reviewed and demonstrates: A. fib with ventricular rates ranging from the 50s to 80s bpm  Weights: Filed Weights   10/12/20 0956  Weight: 47.6 kg    Relevant CV Studies:  2D echo 09/22/2020:  1. Left ventricular ejection fraction, by estimation, is 60 to 65%. The  left ventricle has normal function. The left ventricle has no regional  wall motion abnormalities. Left ventricular diastolic parameters are  indeterminate. The average left  ventricular global longitudinal strain is -9.4 %. The global longitudinal  strain is abnormal.   2. Right ventricular systolic function is normal. The right ventricular  size is normal. There is mildly elevated pulmonary artery systolic  pressure. The estimated right ventricular systolic  pressure is 38.2 mmHg.   3. The mitral valve is normal in structure. Mild to moderate mitral valve  regurgitation.   4. Tricuspid valve regurgitation is moderate.   5. Left atrial size was mildly dilated.   6. Rhythm is atrial fibrillation, rate 120 bpm  __________  TEE 09/25/2020: 1. Left ventricular ejection fraction, by estimation, is 55 to 60%. The  left ventricle has normal function. The left ventricle has no regional  wall motion abnormalities.   2. Right ventricular systolic function is normal. The right ventricular  size is normal. There is normal  pulmonary artery systolic pressure.   3. Left atrial size was mildly dilated. 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 Small mobile thrombus in mid LAA   4. Right atrial size was mildly dilated.   5. The mitral valve is normal in structure. Mild mitral valve  regurgitation. No evidence of mitral stenosis.   6. Tricuspid valve regurgitation is mild to moderate.   7. The aortic valve is tricuspid. Aortic valve regurgitation is not  visualized. No aortic stenosis is present.   8. There is Moderate (Grade III) plaque involving the descending aorta.   9. Agitated saline contrast bubble study was negative, with no evidence  of any interatrial shunt.    Laboratory Data:  Chemistry Recent Labs  Lab 10/11/20 1122 10/12/20 1006  NA 133* 135  K 3.4* 3.3*  CL 87* 91*  CO2 34* 33*  GLUCOSE 113* 112*  BUN 16 15  CREATININE 0.77 0.78  CALCIUM 8.5* 8.5*  GFRNONAA  --  >60  ANIONGAP  --  11    Recent Labs  Lab 10/12/20 1006  PROT 6.7  ALBUMIN 3.5  AST 63*  ALT 84*  ALKPHOS 74  BILITOT 0.9   Hematology Recent Labs  Lab 10/12/20 1006  WBC 5.3  RBC 4.63  HGB 15.7*  HCT 43.1  MCV 93.1  MCH 33.9  MCHC 36.4*  RDW 11.9  PLT 187   Cardiac EnzymesNo results for input(s): TROPONINI in the last 168 hours. No results for input(s): TROPIPOC in the last 168 hours.  BNP Recent Labs  Lab 10/12/20 1006  BNP  1,062.8*    DDimer No results for input(s): DDIMER in the last 168 hours.  Radiology/Studies:  DG Chest 2 View  Result Date: 10/12/2020 IMPRESSION: Lungs are clear. Interval resolution of trace bilateral pleural effusions. Electronically Signed   By: Allegra Lai M.D.   On: 10/12/2020 13:13    Assessment and Plan:   1.  A. fib with slow ventricular response with digoxin toxicity: -Hemodynamically stable -Continue to hold digoxin -Repeat digoxin level in the ED improved to 1.6 -EKG changes persist at this time -Low threshold to initiate Digibind -Repeat digoxin level in the morning -Replete potassium to goal 4.0 -For now, with bradycardia and hypotension recommend holding beta-blocker and calcium channel blocker, may need lower dose -With prior noted left atrial appendage thrombus on TEE, continue to pursue rate control strategy -Eliquis should be continued -CHA2DS2-VASc at least 4 -With regards to her soft BP, cannot exclude some degree of dehydration at this point as well with hemoconcentration noted on CBC -May benefit from gentle IV fluids  2.  HFpEF: -She appears euvolemic on exam, though BNP is significantly elevated today when compared to her value during admission last month -Hold Lasix for now given relative hypotension  3.  Hypokalemia: -Replete to goal 4.0   For questions or updates, please contact [facility] Please consult [email or web address] for contact info under Cardiology/STEMI.   Signed, Eula Listen, PA-C [facility] Pager: [phone] 10/12/2020, 4:03 PM

## 2020-10-12 NOTE — Plan of Care (Signed)
°  Problem: Clinical Measurements: °Goal: Ability to maintain clinical measurements within normal limits will improve °Outcome: Progressing °Goal: Diagnostic test results will improve °Outcome: Progressing °Goal: Cardiovascular complication will be avoided °Outcome: Progressing °  °

## 2020-10-12 NOTE — Telephone Encounter (Signed)
Was able to reach out to Mrs. Angela Singh, advised on Dr. Serita Kyle recommendations  Patient with critically high digoxin level.  I have advised her to stop digoxin.  Please schedule her to be seen by any available provider today (ideally this morning).  If we do not have any opens, she should be advised to go to the ED.  Thanks.   No open slots today per Martie Lee, pt advised to seek the ED as her dig level is critically high 2.7, educated pt on adverse affects of high levels and possible treatment. Pt verbalized understanding and will go to the ED for further treatment. She has voiced she has stop her Digoxin as Dr. Okey Dupre has spoken with her earlier.

## 2020-10-12 NOTE — Progress Notes (Signed)
Pt arrived to floor at 1630 alert and oriented x 4. No complaints of pain, dizziness, nausea or vomiting. Pt ambulated independently from stretcher. Pt oriented to room, call light within reach and bed in lowest position. Assisted pt with ordering dinner. Will continue to monitor.

## 2020-10-12 NOTE — Telephone Encounter (Signed)
[facility] calling to report critical lab value

## 2020-10-12 NOTE — ED Notes (Signed)
Rn aware of bed assignment 

## 2020-10-13 DIAGNOSIS — T460X1A Poisoning by cardiac-stimulant glycosides and drugs of similar action, accidental (unintentional), initial encounter: Secondary | ICD-10-CM | POA: Diagnosis not present

## 2020-10-13 DIAGNOSIS — F32A Depression, unspecified: Secondary | ICD-10-CM

## 2020-10-13 DIAGNOSIS — I952 Hypotension due to drugs: Secondary | ICD-10-CM | POA: Diagnosis not present

## 2020-10-13 DIAGNOSIS — I455 Other specified heart block: Secondary | ICD-10-CM

## 2020-10-13 DIAGNOSIS — R531 Weakness: Secondary | ICD-10-CM | POA: Diagnosis not present

## 2020-10-13 DIAGNOSIS — T460X5A Adverse effect of cardiac-stimulant glycosides and drugs of similar action, initial encounter: Secondary | ICD-10-CM | POA: Diagnosis not present

## 2020-10-13 DIAGNOSIS — I4819 Other persistent atrial fibrillation: Secondary | ICD-10-CM | POA: Diagnosis not present

## 2020-10-13 LAB — LIPID PANEL
Cholesterol: 161 mg/dL (ref 0–200)
HDL: 54 mg/dL (ref >40)
LDL Cholesterol: 93 mg/dL (ref 0–99)
Total CHOL/HDL Ratio: 3 RATIO
Triglycerides: 68 mg/dL (ref <150)
VLDL: 14 mg/dL (ref 0–40)

## 2020-10-13 LAB — BASIC METABOLIC PANEL
Anion gap: 7 (ref 5–15)
BUN: 18 mg/dL (ref 8–23)
CO2: 33 mmol/L — ABNORMAL HIGH (ref 22–32)
Calcium: 8.6 mg/dL — ABNORMAL LOW (ref 8.9–10.3)
Chloride: 94 mmol/L — ABNORMAL LOW (ref 98–111)
Creatinine, Ser: 0.76 mg/dL (ref 0.44–1.00)
GFR, Estimated: >60 mL/min (ref >60)
Glucose, Bld: 105 mg/dL — ABNORMAL HIGH (ref 70–99)
Potassium: 3.5 mmol/L (ref 3.5–5.1)
Sodium: 134 mmol/L — ABNORMAL LOW (ref 135–145)

## 2020-10-13 LAB — HEMOGLOBIN A1C
Hgb A1c MFr Bld: 6 % — ABNORMAL HIGH (ref 4.8–5.6)
Mean Plasma Glucose: 125.5 mg/dL

## 2020-10-13 LAB — DIGOXIN LEVEL: Digoxin Level: 1.6 ng/mL (ref 0.8–2.0)

## 2020-10-13 LAB — SARS CORONAVIRUS 2 (TAT 6-24 HRS): SARS Coronavirus 2: NEGATIVE

## 2020-10-13 MED ORDER — POTASSIUM CHLORIDE CRYS ER 20 MEQ PO TBCR
40.0000 meq | EXTENDED_RELEASE_TABLET | Freq: Once | ORAL | Status: AC
  Administered 2020-10-13: 40 meq via ORAL
  Filled 2020-10-13: qty 4

## 2020-10-13 MED ORDER — MOMETASONE FURO-FORMOTEROL FUM 200-5 MCG/ACT IN AERO
2.0000 | INHALATION_SPRAY | Freq: Two times a day (BID) | RESPIRATORY_TRACT | Status: DC
  Administered 2020-10-13: 2 via RESPIRATORY_TRACT
  Filled 2020-10-13: qty 8.8

## 2020-10-13 MED ORDER — FLUOXETINE HCL 20 MG PO CAPS
40.0000 mg | ORAL_CAPSULE | Freq: Every day | ORAL | Status: DC
  Administered 2020-10-13: 40 mg via ORAL
  Filled 2020-10-13 (×2): qty 2

## 2020-10-13 MED ORDER — APIXABAN 5 MG PO TABS
5.0000 mg | ORAL_TABLET | Freq: Two times a day (BID) | ORAL | Status: DC
  Administered 2020-10-13: 5 mg via ORAL
  Filled 2020-10-13: qty 1

## 2020-10-13 MED ORDER — METOPROLOL TARTRATE 25 MG PO TABS
25.0000 mg | ORAL_TABLET | Freq: Two times a day (BID) | ORAL | Status: DC
  Administered 2020-10-13: 25 mg via ORAL
  Filled 2020-10-13: qty 1

## 2020-10-13 MED ORDER — PANTOPRAZOLE SODIUM 40 MG PO TBEC
40.0000 mg | DELAYED_RELEASE_TABLET | Freq: Every day | ORAL | Status: DC
  Administered 2020-10-13: 40 mg via ORAL
  Filled 2020-10-13: qty 1

## 2020-10-13 MED ORDER — FUROSEMIDE 40 MG PO TABS
40.0000 mg | ORAL_TABLET | Freq: Every day | ORAL | Status: DC
  Administered 2020-10-13: 40 mg via ORAL
  Filled 2020-10-13: qty 1

## 2020-10-13 MED ORDER — METOPROLOL TARTRATE 25 MG PO TABS: 25.0000 mg | ORAL_TABLET | Freq: Two times a day (BID) | ORAL | 0 refills | Status: DC

## 2020-10-13 MED ORDER — ASCORBIC ACID 500 MG PO TABS
1000.0000 mg | ORAL_TABLET | Freq: Two times a day (BID) | ORAL | Status: DC
  Administered 2020-10-13: 1000 mg via ORAL
  Filled 2020-10-13: qty 2

## 2020-10-13 MED ORDER — ATORVASTATIN CALCIUM 20 MG PO TABS
20.0000 mg | ORAL_TABLET | Freq: Every day | ORAL | Status: DC
  Administered 2020-10-13: 20 mg via ORAL
  Filled 2020-10-13: qty 1

## 2020-10-13 MED ORDER — ADULT MULTIVITAMIN W/MINERALS CH
ORAL_TABLET | Freq: Every day | ORAL | Status: DC
  Administered 2020-10-13: 1 via ORAL
  Filled 2020-10-13: qty 1

## 2020-10-13 MED ORDER — VITAMIN E 45 MG (100 UNIT) PO CAPS
400.0000 [IU] | ORAL_CAPSULE | Freq: Two times a day (BID) | ORAL | Status: DC
  Administered 2020-10-13: 400 [IU] via ORAL
  Filled 2020-10-13 (×2): qty 4

## 2020-10-13 MED ORDER — ALBUTEROL SULFATE HFA 108 (90 BASE) MCG/ACT IN AERS: 2.0000 | INHALATION_SPRAY | RESPIRATORY_TRACT | Status: DC | PRN

## 2020-10-13 MED ORDER — ALBUTEROL SULFATE (2.5 MG/3ML) 0.083% IN NEBU: 2.5000 mg | INHALATION_SOLUTION | RESPIRATORY_TRACT | Status: DC | PRN

## 2020-10-13 NOTE — Telephone Encounter (Signed)
See below message for details. Digoxin level has been addressed.

## 2020-10-13 NOTE — Plan of Care (Signed)
  Problem: Clinical Measurements: Goal: Ability to maintain clinical measurements within normal limits will improve Outcome: Progressing   

## 2020-10-13 NOTE — Progress Notes (Signed)
Progress Note  Patient Name: Angela Singh Date of Encounter: 10/13/2020  Primary Cardiologist: Mariah Milling  Subjective   Orders were not released in the ED. She did not receive any doses of Eliquis yesterday. Digoxin level and EKG pending this morning. She remains in Afib with improved ventricular rates in the 70s bpm. Systolic BP in the low 100s mmHg. No chest pain, dyspnea, palpitations, dizziness, presyncope, or syncope. Wants bacon.   Inpatient Medications    Scheduled Meds:  apixaban  5 mg Oral BID   ascorbic acid  1,000 mg Oral BID   atorvastatin  20 mg Oral Daily   FLUoxetine  40 mg Oral Daily   furosemide  40 mg Oral Daily   mometasone-formoterol  2 puff Inhalation BID   multivitamin with minerals   Oral Daily   nicotine  21 mg Transdermal Daily   pantoprazole  40 mg Oral Daily   potassium chloride  40 mEq Oral Once   sodium chloride flush  10 mL Intravenous Q12H   vitamin E  400 Units Oral BID   Continuous Infusions:  PRN Meds: acetaminophen, albuterol, ondansetron (ZOFRAN) IV   Vital Signs    Vitals:   10/13/20 0033 10/13/20 0408 10/13/20 0500 10/13/20 0810  BP: (!) 102/51 114/64  106/71  Pulse: (!) 48 (!) 101  72  Resp: 18 19  16   Temp: 97.9 F (36.6 C) 97.6 F (36.4 C)  97.9 F (36.6 C)  TempSrc:    Oral  SpO2: 90% 90%  100%  Weight:   49 kg   Height:        Intake/Output Summary (Last 24 hours) at 10/13/2020 0915 Last data filed at 10/13/2020 12/13/2020 Gross per 24 hour  Intake 840 ml  Output --  Net 840 ml   Filed Weights   10/12/20 0956 10/13/20 0500  Weight: 47.6 kg 49 kg    Telemetry    Afib, 70s bpm - Personally Reviewed  ECG    Pending - Personally Reviewed  Physical Exam   GEN: No acute distress.   Neck: No JVD. Cardiac: IRIR, no murmurs, rubs, or gallops.  Respiratory: Clear to auscultation bilaterally.  GI: Soft, nontender, non-distended.   MS: No edema; No deformity. Neuro:  Alert and oriented x 3; Nonfocal.  Psych: Normal  affect.  Labs    Chemistry Recent Labs  Lab 10/11/20 1122 10/12/20 1006 10/13/20 0519  NA 133* 135 134*  K 3.4* 3.3* 3.5  CL 87* 91* 94*  CO2 34* 33* 33*  GLUCOSE 113* 112* 105*  BUN 16 15 18   CREATININE 0.77 0.78 0.76  CALCIUM 8.5* 8.5* 8.6*  PROT  --  6.7  --   ALBUMIN  --  3.5  --   AST  --  63*  --   ALT  --  84*  --   ALKPHOS  --  74  --   BILITOT  --  0.9  --   GFRNONAA  --  >60 >60  ANIONGAP  --  11 7     Hematology Recent Labs  Lab 10/12/20 1006  WBC 5.3  RBC 4.63  HGB 15.7*  HCT 43.1  MCV 93.1  MCH 33.9  MCHC 36.4*  RDW 11.9  PLT 187    Cardiac EnzymesNo results for input(s): TROPONINI in the last 168 hours. No results for input(s): TROPIPOC in the last 168 hours.   BNP Recent Labs  Lab 10/12/20 1006  BNP 1,062.8*  DDimer No results for input(s): DDIMER in the last 168 hours.   Radiology    DG Chest 2 View  Result Date: 10/12/2020 IMPRESSION: Lungs are clear. Interval resolution of trace bilateral pleural effusions. Electronically Signed   By: Allegra Lai M.D.   On: 10/12/2020 13:13    Cardiac Studies   2D echo 09/22/2020:  1. Left ventricular ejection fraction, by estimation, is 60 to 65%. The  left ventricle has normal function. The left ventricle has no regional  wall motion abnormalities. Left ventricular diastolic parameters are  indeterminate. The average left  ventricular global longitudinal strain is -9.4 %. The global longitudinal  strain is abnormal.   2. Right ventricular systolic function is normal. The right ventricular  size is normal. There is mildly elevated pulmonary artery systolic  pressure. The estimated right ventricular systolic pressure is 38.2 mmHg.   3. The mitral valve is normal in structure. Mild to moderate mitral valve  regurgitation.   4. Tricuspid valve regurgitation is moderate.   5. Left atrial size was mildly dilated.   6. Rhythm is atrial fibrillation, rate 120 bpm  __________   TEE  09/25/2020: 1. Left ventricular ejection fraction, by estimation, is 55 to 60%. The  left ventricle has normal function. The left ventricle has no regional  wall motion abnormalities.   2. Right ventricular systolic function is normal. The right ventricular  size is normal. There is normal pulmonary artery systolic pressure.   3. Left atrial size was mildly dilated. 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 Small mobile thrombus in mid LAA   4. Right atrial size was mildly dilated.   5. The mitral valve is normal in structure. Mild mitral valve  regurgitation. No evidence of mitral stenosis.   6. Tricuspid valve regurgitation is mild to moderate.   7. The aortic valve is tricuspid. Aortic valve regurgitation is not  visualized. No aortic stenosis is present.   8. There is Moderate (Grade III) plaque involving the descending aorta.   9. Agitated saline contrast bubble study was negative, with no evidence  of any interatrial shunt.   Patient Profile     70 y.o. female with history of  recently diagnosed A. fib, HTN, HLD, COPD, tobacco use, and GERD who is being seen today for the evaluation of elevated digoxin at the request of Dr. Clyde Lundborg.  Assessment & Plan    1. Afib with slow ventricular response with digoxin toxicity: -Ventricular rates are improving from the 50s bpm to 70s bpm with the holding of digoxin, metoprolol, and diltiazem  -Will likely need a low dose metoprolol prior to discharge as BP allows -She did not receive Eliquis in the ED on 9/8, missed two doses -Eliquis is ordered for this morning -CHADS2VASc at least 4 -Repeat digoxin level and EKG this morning are pending -No indication for Digibind at this time -Will need a repeat TEE/DCCV in the outpatient setting to ensure LAA thrombus is resolved, if she wishes to pursue rhythm control strategy   2. HFpEF: -She appears euvolemic and well compensated -Not currently requiring a diuretic with relative  hypotension   3. Hypokalemia: -Improving -Replete to goal 4.0  For questions or updates, please contact [facility] Please consult [email or web address] for contact info under Cardiology/STEMI.    Signed, Eula Listen, PA-C [facility] Pager: [phone] 10/13/2020, 9:15 AM

## 2020-10-13 NOTE — Evaluation (Signed)
Physical Therapy Evaluation Patient Details Name: Angela Singh MRN: [id number] DOB: Feb 21, 1950 Today's Date: 10/13/2020   History of Present Illness  presented to ER secondary to weakness, abnormal digoxin level; admitted for management of digoxin toxicity, abnormal EKG  Clinical Impression  Patient standing at bedside upon arrival to room; working to "straighten up" the counter.  Alert and oriented; follows commands and agreeable to participation.  Bilat UE/LE strength and ROM grossly symmetrical and WFL; no focal weakness appreciated.  Mildly tremulous at times, but does not impact mobility or safety.  Able to complete bed mobility indep; sit/stand, basic transfers and gait (220') without assist device, mod indep.  Demonstrates reciprocal stepping pattern, good trunk rotation and arm swing; good cadence.  Completes dynamic gait components (head turns, change of direction, start/stop) without difficulty or safety concern.  HR 100-110s with gait efforts; recovers to 90s with seated rest.  Asymptomatic throughout. Appears to be at baseline level of functional ability; no acute PT needs identified.  Will complete orders at this time; please re-consult should needs change.    Follow Up Recommendations No PT follow up    Equipment Recommendations  None recommended by PT    Recommendations for Other Services       Precautions / Restrictions Precautions Precautions: None Restrictions Weight Bearing Restrictions: No      Mobility  Bed Mobility Overal bed mobility: Independent                  Transfers Overall transfer level: Independent Equipment used: None             General transfer comment: Good LE strength/control with movement transitions  Ambulation/Gait Ambulation/Gait assistance: Modified independent (Device/Increase time) Gait Distance (Feet): 220 Feet Assistive device: None   Gait velocity: 10' walk time, 5 seconds Gait velocity interpretation: 1.31 -  2.62 ft/sec, indicative of limited community ambulator General Gait Details: reciprocal stepping pattern, good trunk rotation and arm swing; good cadence.  Completes dynamic gait components (head turns, change of direction, start/stop) without difficulty or safety concern.  HR 100-110s with gait efforts; recovers to 90s with seated rest.  Asymptomatic throughout  Stairs            Wheelchair Mobility    Modified Rankin (Stroke Patients Only)       Balance Overall balance assessment: Independent                                           Pertinent Vitals/Pain Pain Assessment: No/denies pain    Home Living Family/patient expects to be discharged to:: Private residence Living Arrangements: Spouse/significant other Available Help at Discharge: Family;Available 24 hours/day   Home Access: Stairs to enter Entrance Stairs-Rails: Right;Left;Can reach both Entrance Stairs-Number of Steps: 4 Home Layout: One level        Prior Function Level of Independence: Independent         Comments: Indep with ADLs, household and community mobilization without assist device; denies fall history.     Hand Dominance        Extremity/Trunk Assessment   Upper Extremity Assessment Upper Extremity Assessment: Overall WFL for tasks assessed    Lower Extremity Assessment Lower Extremity Assessment: Overall WFL for tasks assessed       Communication   Communication: No difficulties  Cognition Arousal/Alertness: Awake/alert Behavior During Therapy: WFL for tasks assessed/performed Overall Cognitive Status: Within Functional  Limits for tasks assessed                                        General Comments      Exercises     Assessment/Plan    PT Assessment Patent does not need any further PT services  PT Problem List Decreased balance;Decreased activity tolerance;Decreased mobility       PT Treatment Interventions Gait  training;Functional mobility training;Therapeutic activities;Patient/family education    PT Goals (Current goals can be found in the Care Plan section)  Acute Rehab PT Goals Patient Stated Goal: to return home PT Goal Formulation: All assessment and education complete, DC therapy Time For Goal Achievement: 10/13/20 Potential to Achieve Goals: Good    Frequency  (Eval only.)   Barriers to discharge        Co-evaluation               AM-PAC PT "6 Clicks" Mobility  Outcome Measure Help needed turning from your back to your side while in a flat bed without using bedrails?: None Help needed moving from lying on your back to sitting on the side of a flat bed without using bedrails?: None Help needed moving to and from a bed to a chair (including a wheelchair)?: None Help needed standing up from a chair using your arms (e.g., wheelchair or bedside chair)?: None Help needed to walk in hospital room?: None Help needed climbing 3-5 steps with a railing? : None 6 Click Score: 24    End of Session Equipment Utilized During Treatment: Gait belt Activity Tolerance: Patient tolerated treatment well Patient left: in bed;with call bell/phone within reach Nurse Communication: Mobility status PT Visit Diagnosis: Difficulty in walking, not elsewhere classified (R26.2)    Time: 0955-1005 PT Time Calculation (min) (ACUTE ONLY): 10 min   Charges:   PT Evaluation $PT Eval Low Complexity: 1 Low          Alaia Lordi H. Manson Passey, PT, DPT, NCS 10/13/20, 10:21 AM [phone]

## 2020-10-13 NOTE — Discharge Summary (Signed)
Physician Discharge Summary  ONICA DAVIDOVICH GLO:[id number] DOB: 10/12/1950 DOA: 10/12/2020  PCP: Oswaldo Conroy, MD  Admit date: 10/12/2020 Discharge date: 10/13/2020  Admitted From: Home Disposition: Home  Recommendations for Outpatient Follow-up:  Follow up with PCP in 1-2 weeks Follow-up with cardiology in 1 week Please obtain BMP/CBC in one week Please follow up on the following pending results: None  Home Health: No Equipment/Devices: None Discharge Condition: Stable CODE STATUS: Full Diet recommendation: Heart Healthy    Brief/Interim Summary:  MYKAYLA BRINTON is a 70 y.o. female with medical history significant of hypertension, hyperlipidemia, COPD, GERD, depression, tobacco abuse, dCHF, atrial fibrillation on Eliquis and digoxin, who presents with weakness and abnormal digoxin level.   Pt was recently hospitalized from 8/18-8/23 due to acute dCHF and A. fib with RVR.  Patient is on metoprolol succinate 50 twice daily, diltiazem extended release 180 and digoxin 0.25 daily at the time of discharge. Pt had test for follow-up digoxin level as outpatient, and was found to be elevated at 2.7.  She states that she had transient blue-colored vision yesterday, today no vision change.  Denies chest pain, cough, shortness of breath.  No nausea vomiting, diarrhea or abdominal pain.  She has a generalized weakness and fatigue.  No unilateral numbness or tingling in extremities. Digoxin was discontinued by her cardiologist.  Repeat levels at 1.6.  Her home dose of Cardizem was also held and dose of metoprolol was decreased to 25 mg twice daily and she will continue with Eliquis and closely follow-up with her cardiologist for further recommendations.  Patient has an history of HFpEF.  Echocardiogram done in August 2022 with EF of 55 to 60%.  Patient did had elevated BNP at 1062 during current hospitalization, no shortness of breath.  She appears euvolemic.  She will continue home dose of  Lasix.  She was also found to have positive troponin at 29, likely due to demand ischemia.  No concern of ACS at this time.  She will continue rest of her home medications and follow-up with her providers.  Discharge Diagnoses:  Principal Problem:   Digoxin toxicity Active Problems:   Essential hypertension   Hyperlipidemia   GERD (gastroesophageal reflux disease)   Tobacco abuse   Depression   Persistent atrial fibrillation (HCC)   COPD (chronic obstructive pulmonary disease) (HCC)   Chronic diastolic CHF (congestive heart failure) (HCC)   Hypokalemia   Elevated troponin   Discharge Instructions  Discharge Instructions     Diet - low sodium heart healthy   Complete by: As directed    Discharge instructions   Complete by: As directed    It was pleasure taking care of you. Your cardiologist stop digoxin and Cardizem and decrease the dose of metoprolol. Continue taking your medications as directed now and follow-up with your cardiologist very closely for further recommendations.   Increase activity slowly   Complete by: As directed       Allergies as of 10/13/2020       Reactions   Codeine Other (See Comments)   weakness        Medication List     STOP taking these medications    digoxin 0.25 MG tablet Commonly known as: LANOXIN   diltiazem 180 MG 24 hr capsule Commonly known as: CARDIZEM CD   metoprolol succinate 50 MG 24 hr tablet Commonly known as: TOPROL-XL   predniSONE 20 MG tablet Commonly known as: DELTASONE       TAKE these medications  albuterol 108 (90 Base) MCG/ACT inhaler Commonly known as: VENTOLIN HFA Inhale 2 puffs into the lungs every 4 (four) hours as needed.   apixaban 5 MG Tabs tablet Commonly known as: ELIQUIS Take 1 tablet (5 mg total) by mouth 2 (two) times daily.   ascorbic acid 500 MG tablet Commonly known as: VITAMIN C Take 1,000 mg by mouth 2 (two) times daily.   atorvastatin 20 MG tablet Commonly known as:  LIPITOR Take 20 mg by mouth daily.   FLUoxetine 40 MG capsule Commonly known as: PROZAC Take 40 mg by mouth daily.   furosemide 40 MG tablet Commonly known as: LASIX Take 1 tablet (40 mg total) by mouth daily.   metoprolol tartrate 25 MG tablet Commonly known as: LOPRESSOR Take 1 tablet (25 mg total) by mouth 2 (two) times daily.   ONE-A-DAY WOMENS 50+ PO Take 1 tablet by mouth daily.   pantoprazole 40 MG tablet Commonly known as: PROTONIX Take 40 mg by mouth daily.   potassium chloride SA 20 MEQ tablet Commonly known as: KLOR-CON Take 1 tablet (20 mEq total) by mouth daily.   Symbicort 160-4.5 MCG/ACT inhaler Generic drug: budesonide-formoterol Inhale 1 puff into the lungs 2 (two) times daily in the am and at bedtime..   vitamin E 180 MG (400 UNITS) capsule Take 400 Units by mouth 2 (two) times daily.        Follow-up Information     Bender, Earl Lagos, MD. Schedule an appointment as soon as possible for a visit.   Specialty: Family Medicine Contact information: [address] [place] [address] [phone]         Antonieta Iba, MD. Schedule an appointment as soon as possible for a visit in 1 week(s).   Specialty: Cardiology Contact information: [address] [place] [address] [phone]                Allergies  Allergen Reactions   Codeine Other (See Comments)    weakness    Consultations: Cardiology  Procedures/Studies: DG Chest 2 View  Result Date: 10/12/2020 CLINICAL DATA:  Weakness EXAM: CHEST - 2 VIEW COMPARISON:  Chest x-ray dated September 21, 2020 FINDINGS: Cardiac and mediastinal contours are unchanged within normal limits. Interval resolution of bilateral interstitial opacities. Lungs are clear. Interval resolution of trace bilateral pleural effusions. No evidence pneumothorax. IMPRESSION: Lungs are clear. Interval resolution of trace bilateral pleural effusions. Electronically Signed    By: Allegra Lai M.D.   On: 10/12/2020 13:13   DG Chest 2 View  Result Date: 09/21/2020 CLINICAL DATA:  Chest pain and shortness of breath. New onset atrial fibrillation and RVR. EXAM: CHEST - 2 VIEW COMPARISON:  Chest x-ray 02/07/2006 FINDINGS: The heart and mediastinal contours are unchanged. Aortic calcification. No focal consolidation. No pulmonary edema. Increased and coarsened interstitial markings. Bilateral trace pleural effusion. No pneumothorax. No acute osseous abnormality. IMPRESSION: Mild pulmonary edema with trace bilateral pleural effusions. Electronically Signed   By: Tish Frederickson M.D.   On: 09/21/2020 21:35   ECHOCARDIOGRAM COMPLETE  Result Date: 09/23/2020    ECHOCARDIOGRAM REPORT   Patient Name:   GAYLIN OSORIA Vinal Date of Exam: 09/22/2020 Medical Rec #:  [id number]      Height:       64.0 in Accession #:    [id number]     Weight:       115.0 lb Date of Birth:  12/18/1950      BSA:  1.546 m Patient Age:    69 years       BP:           107/76 mmHg Patient Gender: F              HR:           113 bpm. [facility] Procedure: 2D Echo, Cardiac Doppler and Color Doppler Indications:     Atrial Fibrillation I48.91  History:         Patient has no prior history of Echocardiogram examinations.                  Risk Factors:Hypertension. HLD.  Sonographer:     Neysa Bonito Roar Referring Phys:  [id number] Antonieta Iba Diagnosing Phys: Julien Nordmann MD IMPRESSIONS  1. Left ventricular ejection fraction, by estimation, is 60 to 65%. The left ventricle has normal function. The left ventricle has no regional wall motion abnormalities. Left ventricular diastolic parameters are indeterminate. The average left ventricular global longitudinal strain is -9.4 %. The global longitudinal strain is abnormal.  2. Right ventricular systolic function is normal. The right ventricular size is normal. There is mildly elevated pulmonary artery systolic pressure. The estimated right ventricular  systolic pressure is 38.2 mmHg.  3. The mitral valve is normal in structure. Mild to moderate mitral valve regurgitation.  4. Tricuspid valve regurgitation is moderate.  5. Left atrial size was mildly dilated.  6. Rhythm is atrial fibrillation, rate 120 bpm FINDINGS  Left Ventricle: Left ventricular ejection fraction, by estimation, is 60 to 65%. The left ventricle has normal function. The left ventricle has no regional wall motion abnormalities. The average left ventricular global longitudinal strain is -9.4 %. The  global longitudinal strain is abnormal. The left ventricular internal cavity size was normal in size. There is no left ventricular hypertrophy. Left ventricular diastolic parameters are indeterminate. Right Ventricle: The right ventricular size is normal. No increase in right ventricular wall thickness. Right ventricular systolic function is normal. There is mildly elevated pulmonary artery systolic pressure. The tricuspid regurgitant velocity is 2.88  m/s, and with an assumed right atrial pressure of 5 mmHg, the estimated right ventricular systolic pressure is 38.2 mmHg. Left Atrium: Left atrial size was mildly dilated. Right Atrium: Right atrial size was normal in size. Pericardium: There is no evidence of pericardial effusion. Mitral Valve: The mitral valve is normal in structure. Mild to moderate mitral valve regurgitation. No evidence of mitral valve stenosis. Tricuspid Valve: The tricuspid valve is normal in structure. Tricuspid valve regurgitation is moderate . No evidence of tricuspid stenosis. Aortic Valve: The aortic valve was not well visualized. Aortic valve regurgitation is not visualized. No aortic stenosis is present. Aortic valve peak gradient measures 5.8 mmHg. Pulmonic Valve: The pulmonic valve was normal in structure. Pulmonic valve regurgitation is not visualized. No evidence of pulmonic stenosis. Aorta: The aortic root is normal in size and structure. Venous: The inferior vena cava  is normal in size with greater than 50% respiratory variability, suggesting right atrial pressure of 3 mmHg. IAS/Shunts: No atrial level shunt detected by color flow Doppler.  LEFT VENTRICLE PLAX 2D LVIDd:         4.04 cm  Diastology LVIDs:         2.99 cm  LV e' medial:    8.81 cm/s LV PW:         0.95 cm  LV E/e' medial:  11.6 LV IVS:  0.90 cm  LV e' lateral:   13.20 cm/s LVOT diam:     1.70 cm  LV E/e' lateral: 7.7 LVOT Area:     2.27 cm                         2D Longitudinal Strain                         2D Strain GLS Avg:     -9.4 % RIGHT VENTRICLE RV Basal diam:  3.76 cm RV Mid diam:    2.94 cm RV S prime:     14.50 cm/s TAPSE (M-mode): 2.0 cm LEFT ATRIUM             Index       RIGHT ATRIUM           Index LA diam:        3.80 cm 2.46 cm/m  RA Area:     21.20 cm LA Vol (A2C):   66.7 ml 43.13 ml/m RA Volume:   63.90 ml  41.32 ml/m LA Vol (A4C):   57.8 ml 37.38 ml/m LA Biplane Vol: 63.5 ml 41.06 ml/m  AORTIC VALVE                PULMONIC VALVE AV Area (Vmax): 1.51 cm    PV Vmax:        0.92 m/s AV Vmax:        120.00 cm/s PV Peak grad:   3.4 mmHg AV Peak Grad:   5.8 mmHg    RVOT Peak grad: 2 mmHg LVOT Vmax:      79.70 cm/s  AORTA Ao Asc diam: 2.60 cm MITRAL VALVE                TRICUSPID VALVE MV Area (PHT): 4.86 cm     TR Peak grad:   33.2 mmHg MV Decel Time: 156 msec     TR Vmax:        288.00 cm/s MV E velocity: 102.00 cm/s                             SHUNTS                             Systemic Diam: 1.70 cm Julien Nordmannimothy Gollan MD Electronically signed by Julien Nordmannimothy Gollan MD Signature Date/Time: 09/23/2020/9:37:47 AM    Final    ECHO TEE  Result Date: 09/25/2020    TRANSESOPHOGEAL ECHO REPORT   Patient Name:   Madelyn FlavorsDARCY J Breuer Date of Exam: 09/25/2020 Medical Rec #:  [id number]      Height:       64.0 in Accession #:    [id number]     Weight:       123.2 lb Date of Birth:  Aug 04, 1950      BSA:          1.592 m Patient Age:    69 years       BP:           108/67 mmHg Patient Gender: F               HR:           114 bpm. Exam Location:  [facility] Procedure: Transesophageal Echo, Color Doppler, Cardiac Doppler and Saline  Contrast Bubble Study Indications:     Not listed on TEE check-in sheet  History:         Patient has prior history of Echocardiogram examinations, most                  recent 09/22/2020. COPD; Risk Factors:Hypertension. Tobacco                  abuse.  Sonographer:     Cristela Blue Referring Phys:  [id number] CHRISTOPHER RONALD BERGE Diagnosing Phys: Lorine Bears MD PROCEDURE: The transesophogeal probe was passed without difficulty through the esophogus of the patient. Local oropharyngeal anesthetic was provided with Benzocaine spray and viscous lidocaine. Sedation performed by different physician. The patient was monitored while under deep sedation. Image quality was good. The patient's vital signs; including heart rate, blood pressure, and oxygen saturation; remained stable throughout the procedure. The patient developed no complications during the procedure. Cardioversion was not performed due to presense of small LAA thrombus. IMPRESSIONS  1. Left ventricular ejection fraction, by estimation, is 55 to 60%. The left ventricle has normal function. The left ventricle has no regional wall motion abnormalities.  2. Right ventricular systolic function is normal. The right ventricular size is normal. There is normal pulmonary artery systolic pressure.  3. Left atrial size was mildly dilated. A left atrial/left atrial appendage thrombus was detected. Small mobile thrombus in mid LAA  4. Right atrial size was mildly dilated.  5. The mitral valve is normal in structure. Mild mitral valve regurgitation. No evidence of mitral stenosis.  6. Tricuspid valve regurgitation is mild to moderate.  7. The aortic valve is tricuspid. Aortic valve regurgitation is not visualized. No aortic stenosis is present.  8. There is Moderate (Grade III) plaque involving the descending aorta.  9. Agitated saline contrast  bubble study was negative, with no evidence of any interatrial shunt. FINDINGS  Left Ventricle: Left ventricular ejection fraction, by estimation, is 55 to 60%. The left ventricle has normal function. The left ventricle has no regional wall motion abnormalities. The left ventricular internal cavity size was normal in size. There is  no left ventricular hypertrophy. Right Ventricle: The right ventricular size is normal. No increase in right ventricular wall thickness. Right ventricular systolic function is normal. There is normal pulmonary artery systolic pressure. The tricuspid regurgitant velocity is 2.60 m/s, and  with an assumed right atrial pressure of 5 mmHg, the estimated right ventricular systolic pressure is 32.0 mmHg. Left Atrium: Small mobile thrombus in mid LAA. Left atrial size was mildly dilated. A left atrial/left atrial appendage thrombus was detected. Right Atrium: Right atrial size was mildly dilated. Pericardium: There is no evidence of pericardial effusion. Mitral Valve: The mitral valve is normal in structure. Mild mitral valve regurgitation. No evidence of mitral valve stenosis. Tricuspid Valve: The tricuspid valve is normal in structure. Tricuspid valve regurgitation is mild to moderate. No evidence of tricuspid stenosis. Aortic Valve: The aortic valve is tricuspid. Aortic valve regurgitation is not visualized. No aortic stenosis is present. Pulmonic Valve: The pulmonic valve was normal in structure. Pulmonic valve regurgitation is not visualized. No evidence of pulmonic stenosis. Aorta: The aortic root is normal in size and structure. There is moderate (Grade III) plaque involving the descending aorta. Venous: The inferior vena cava was not well visualized. IAS/Shunts: No atrial level shunt detected by color flow Doppler. Agitated saline contrast was given intravenously to evaluate for intracardiac shunting. Agitated saline contrast bubble study was negative, with no  evidence of any  interatrial shunt.  TRICUSPID VALVE TR Peak grad:   27.0 mmHg TR Vmax:        260.00 cm/s Lorine Bears MD Electronically signed by Lorine Bears MD Signature Date/Time: 09/25/2020/4:26:59 PM    Final     Subjective: Patient was seen and examined today.  No complaints.  She thinks that she is at her baseline now.  Discharge Exam: Vitals:   10/13/20 0810 10/13/20 1159  BP: 106/71 (!) 111/55  Pulse: 72 68  Resp: 16 17  Temp: 97.9 F (36.6 C) 98.5 F (36.9 C)  SpO2: 100% 97%   Vitals:   10/13/20 0408 10/13/20 0500 10/13/20 0810 10/13/20 1159  BP: 114/64  106/71 (!) 111/55  Pulse: (!) 101  72 68  Resp: Temp: 97.6 F (36.4 C)  97.9 F (36.6 C) 98.5 F (36.9 C)  TempSrc:   Oral Oral  SpO2: 90%  100% 97%  Weight:  49 kg    Height:        General: Pt is alert, awake, not in acute distress Cardiovascular: RRR, S1/S2 +, no rubs, no gallops Respiratory: CTA bilaterally, no wheezing, no rhonchi Abdominal: Soft, NT, ND, bowel sounds + Extremities: no edema, no cyanosis   The results of significant diagnostics from this hospitalization (including imaging, microbiology, ancillary and laboratory) are listed below for reference.    Microbiology: Recent Results (from the past 240 hour(s))  SARS CORONAVIRUS 2 (TAT 6-24 HRS) Nasopharyngeal Nasopharyngeal Swab     Status: None   Collection Time: 10/12/20  3:36 PM   Specimen: Nasopharyngeal Swab  Result Value Ref Range Status   SARS Coronavirus 2 NEGATIVE NEGATIVE Final    Comment: (NOTE) SARS-CoV-2 target nucleic acids are NOT DETECTED.  The SARS-CoV-2 RNA is generally detectable in upper and lower respiratory specimens during the acute phase of infection. Negative results do not preclude SARS-CoV-2 infection, do not rule out co-infections with other pathogens, and should not be used as the sole basis for treatment or other patient management decisions. Negative results must be combined with clinical  observations, patient history, and epidemiological information. The expected result is Negative.  Fact Sheet for Patients: HairSlick.no  Fact Sheet for Healthcare Providers: quierodirigir.com  This test is not yet approved or cleared by the [place] FDA and  has been authorized for detection and/or diagnosis of SARS-CoV-2 by FDA under an Emergency Use Authorization (EUA). This EUA will remain  in effect (meaning this test can be used) for the duration of the COVID-19 declaration under Se ction 564(b)(1) of the Act, 21 U.S.C. section 360bbb-3(b)(1), unless the authorization is terminated or revoked sooner.  Performed at [facility], [address] [place], [place] [address]      Labs: BNP (last 3 results) Recent Labs    09/21/20 2055 10/12/20 1006  BNP 547.2* 1,062.8*   Basic Metabolic Panel: Recent Labs  Lab 10/11/20 1122 10/12/20 1006 10/13/20 0519  NA 133* 135 134*  K 3.4* 3.3* 3.5  CL 87* 91* 94*  CO2 34* 33* 33*  GLUCOSE 113* 112* 105*  BUN CREATININE 0.77 0.78 0.76  CALCIUM 8.5* 8.5* 8.6*  MG  --  2.0  --    Liver Function Tests: Recent Labs  Lab 10/12/20 1006  AST 63*  ALT 84*  ALKPHOS 74  BILITOT 0.9  PROT 6.7  ALBUMIN 3.5   No results for input(s): LIPASE, AMYLASE in the last 168  hours. No results for input(s): AMMONIA in the last 168 hours. CBC: Recent Labs  Lab 10/12/20 1006  WBC 5.3  HGB 15.7*  HCT 43.1  MCV 93.1  PLT 187   Cardiac Enzymes: No results for input(s): CKTOTAL, CKMB, CKMBINDEX, TROPONINI in the last 168 hours. BNP: Invalid input(s): POCBNP CBG: No results for input(s): GLUCAP in the last 168 hours. D-Dimer No results for input(s): DDIMER in the last 72 hours. Hgb A1c Recent Labs    10/12/20 1927  HGBA1C 6.0*   Lipid Profile Recent Labs    10/13/20 0519  CHOL 161  HDL 54  LDLCALC 93  TRIG 68  CHOLHDL 3.0   Thyroid function  studies No results for input(s): TSH, T4TOTAL, T3FREE, THYROIDAB in the last 72 hours.  Invalid input(s): FREET3 Anemia work up No results for input(s): VITAMINB12, FOLATE, FERRITIN, TIBC, IRON, RETICCTPCT in the last 72 hours. Urinalysis No results found for: COLORURINE, APPEARANCEUR, LABSPEC, PHURINE, GLUCOSEU, HGBUR, BILIRUBINUR, KETONESUR, PROTEINUR, UROBILINOGEN, NITRITE, LEUKOCYTESUR Sepsis Labs Invalid input(s): PROCALCITONIN,  WBC,  LACTICIDVEN Microbiology Recent Results (from the past 240 hour(s))  SARS CORONAVIRUS 2 (TAT 6-24 HRS) Nasopharyngeal Nasopharyngeal Swab     Status: None   Collection Time: 10/12/20  3:36 PM   Specimen: Nasopharyngeal Swab  Result Value Ref Range Status   SARS Coronavirus 2 NEGATIVE NEGATIVE Final    Comment: (NOTE) SARS-CoV-2 target nucleic acids are NOT DETECTED.  The SARS-CoV-2 RNA is generally detectable in upper and lower respiratory specimens during the acute phase of infection. Negative results do not preclude SARS-CoV-2 infection, do not rule out co-infections with other pathogens, and should not be used as the sole basis for treatment or other patient management decisions. Negative results must be combined with clinical observations, patient history, and epidemiological information. The expected result is Negative.  Fact Sheet for Patients: HairSlick.no  Fact Sheet for Healthcare Providers: quierodirigir.com  This test is not yet approved or cleared by the [place] FDA and  has been authorized for detection and/or diagnosis of SARS-CoV-2 by FDA under an Emergency Use Authorization (EUA). This EUA will remain  in effect (meaning this test can be used) for the duration of the COVID-19 declaration under Se ction 564(b)(1) of the Act, 21 U.S.C. section 360bbb-3(b)(1), unless the authorization is terminated or revoked sooner.  Performed at [facility], [address], [place], [place] [address]     Time coordinating discharge: Over 30 minutes  SIGNED:  Arnetha Courser, MD  [facility] 10/13/2020, 1:50 PM  If 7PM-7AM, please contact night-coverage [email or web address]  This record has been created using Conservation officer, historic buildings. Errors have been sought and corrected,but may not always be located. Such creation errors do not reflect on the standard of care.

## 2020-10-23 ENCOUNTER — Ambulatory Visit (INDEPENDENT_AMBULATORY_CARE_PROVIDER_SITE_OTHER): Payer: Medicare HMO | Admitting: Nurse Practitioner

## 2020-10-23 ENCOUNTER — Other Ambulatory Visit: Payer: Self-pay

## 2020-10-23 ENCOUNTER — Encounter: Payer: Self-pay | Admitting: Nurse Practitioner

## 2020-10-23 VITALS — BP 116/76 | HR 138 | Ht 64.0 in | Wt 110.0 lb

## 2020-10-23 DIAGNOSIS — I4819 Other persistent atrial fibrillation: Secondary | ICD-10-CM | POA: Diagnosis not present

## 2020-10-23 DIAGNOSIS — I5032 Chronic diastolic (congestive) heart failure: Secondary | ICD-10-CM

## 2020-10-23 DIAGNOSIS — Z720 Tobacco use: Secondary | ICD-10-CM

## 2020-10-23 DIAGNOSIS — E782 Mixed hyperlipidemia: Secondary | ICD-10-CM

## 2020-10-23 DIAGNOSIS — Z01818 Encounter for other preprocedural examination: Secondary | ICD-10-CM

## 2020-10-23 MED ORDER — DILTIAZEM HCL ER COATED BEADS 180 MG PO CP24: 180.0000 mg | ORAL_CAPSULE | Freq: Every day | ORAL | 3 refills | Status: DC

## 2020-10-23 NOTE — Patient Instructions (Addendum)
Medication Instructions:  Your physician has recommended you make the following change in your medication:   RESTART Diltiazem 180 mg once daily  *If you need a refill on your cardiac medications before your next appointment, please call your pharmacy*   Lab Work: CBC, BMET today  If you have labs (blood work) drawn today and your tests are completely normal, you will receive your results only by: MyChart Message (if you have MyChart) OR A paper copy in the mail If you have any lab test that is abnormal or we need to change your treatment, we will call you to review the results.    Testing/Procedures: TEE INSTRUCTIONS: You are scheduled for a TEE on 10/26/20 with Dr. Mariah Milling.  Please arrive at the [facility] entrance at 06:30 am   DIET: Nothing to eat or drink after midnight except a sip of water with medications.  MEDICATION INSTRUCTIONS: 1) Hold Furosemide (Lasix) and Potassium that morning  2) You may take your other medications as directed with sips of water. PLEASE TAKE YOUR ELIQUIS that morning.   You must have a responsible person to drive you home and stay in the waiting area during your procedure. Failure to do so could result in cancellation.  Bring your insurance cards.  *Special Note: Every effort is made to have your procedure done on time. Occasionally there are emergencies that occur at the hospital that may cause delays. Please be patient if a delay does occur.     At [facility], you and your health needs are our priority.  As part of our continuing mission to provide you with exceptional heart care, we have created designated Provider Care Teams.  These Care Teams include your primary Cardiologist (physician) and Advanced Practice Providers (APPs -  Physician Assistants and Nurse Practitioners) who all work together to provide you with the care you need, when you need it.  You are scheduled for a Cardioversion on October 26, 2020 with  Dr.Gollan Please arrive at [facility] at 06:30 a.m. on the day of your procedure.  DIET INSTRUCTIONS:  Nothing to eat or drink after midnight except your medications with a small sip of water.      Medications:  HOLD Furosemide (Lasix) and Potassiume that morning. YOU MAY TAKE ALL of your remaining medications with a small amount of water.  Must have a responsible person to drive you home.  Bring a current list of your medications and current insurance cards.    If you have any questions after you get home, please call the office at [phone]  You will require COVID testing the day on October 25, 2020 prior to your testing.   Please arrive between from 08:00 am to 11:00 am for COVID testing to be done.   Testing site is in the [facility] building then go the right.   Follow-Up:  We recommend signing up for the patient portal called "MyChart".  Sign up information is provided on this After Visit Summary.  MyChart is used to connect with patients for Virtual Visits (Telemedicine).  Patients are able to view lab/test results, encounter notes, upcoming appointments, etc.  Non-urgent messages can be sent to your provider as well.   To learn more about what you can do with MyChart, go to [email or web address].    Your next appointment:   2 week(s)  The format for your next appointment:   In Person  Provider:   Julien Nordmann, MD or Nicolasa Ducking, NP
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
## 2020-10-24 LAB — CBC
Hematocrit: 40.6 % (ref 34.0–46.6)
Hemoglobin: 13.7 g/dL (ref 11.1–15.9)
MCH: 33.1 pg — ABNORMAL HIGH (ref 26.6–33.0)
MCHC: 33.7 g/dL (ref 31.5–35.7)
MCV: 98 fL — ABNORMAL HIGH (ref 79–97)
Platelets: 223 10*3/uL (ref 150–450)
RBC: 4.14 x10E6/uL (ref 3.77–5.28)
RDW: 12.3 % (ref 11.7–15.4)
WBC: 4 10*3/uL (ref 3.4–10.8)

## 2020-10-24 LAB — BASIC METABOLIC PANEL
BUN/Creatinine Ratio: 12 (ref 12–28)
BUN: 12 mg/dL (ref 8–27)
CO2: 29 mmol/L (ref 20–29)
Calcium: 8.5 mg/dL — ABNORMAL LOW (ref 8.7–10.3)
Chloride: 101 mmol/L (ref 96–106)
Creatinine, Ser: 1.02 mg/dL — ABNORMAL HIGH (ref 0.57–1.00)
Glucose: 81 mg/dL (ref 65–99)
Potassium: 3.2 mmol/L — ABNORMAL LOW (ref 3.5–5.2)
Sodium: 144 mmol/L (ref 134–144)
eGFR: 60 mL/min/{1.73_m2} (ref >59)

## 2020-10-25 ENCOUNTER — Telehealth: Payer: Self-pay | Admitting: Nurse Practitioner

## 2020-10-25 ENCOUNTER — Other Ambulatory Visit: Payer: Self-pay

## 2020-10-25 ENCOUNTER — Other Ambulatory Visit
Admission: RE | Admit: 2020-10-25 | Discharge: 2020-10-25 | Disposition: A | Discharge status: Home / Self Care | Payer: Medicare HMO | Source: Ambulatory Visit | Attending: Cardiovascular Disease | Admitting: Cardiovascular Disease

## 2020-10-25 DIAGNOSIS — Z20822 Contact with and (suspected) exposure to covid-19: Secondary | ICD-10-CM | POA: Insufficient documentation

## 2020-10-25 DIAGNOSIS — Z01812 Encounter for preprocedural laboratory examination: Secondary | ICD-10-CM | POA: Diagnosis present

## 2020-10-25 LAB — SARS CORONAVIRUS 2 (TAT 6-24 HRS): SARS Coronavirus 2: NEGATIVE

## 2020-10-25 MED ORDER — FUROSEMIDE 40 MG PO TABS
40.0000 mg | ORAL_TABLET | Freq: Every day | ORAL | 0 refills | Status: DC
Start: 2020-10-25 — End: 2020-11-03

## 2020-10-25 MED ORDER — APIXABAN 5 MG PO TABS: 5.0000 mg | ORAL_TABLET | Freq: Two times a day (BID) | ORAL | 0 refills | Status: DC

## 2020-10-25 MED ORDER — POTASSIUM CHLORIDE CRYS ER 20 MEQ PO TBCR: 20.0000 meq | EXTENDED_RELEASE_TABLET | Freq: Every day | ORAL | 0 refills | Status: DC

## 2020-10-25 NOTE — Telephone Encounter (Signed)
Called to give the patient lab results and Ward Givens, NP recommendation. Lmtcb.

## 2020-10-25 NOTE — Telephone Encounter (Signed)
Creig Hines, NP  10/24/2020  9:18 AM EDT Back to Top    Kidneys are slightly dry.  Please reduce lasix to 20mg  daily (currently taking 40 daily - can take 1/2 tab).  Potassium is low.  She is currently taking KCl daily.  Pls have her take twice today and than daily starting tomorrow.  She should have a f/u bmet when she presents for tee/dccv on Thursday. Blood counts ok.

## 2020-10-25 NOTE — Telephone Encounter (Signed)
*  STAT* If patient is at the pharmacy, call can be transferred to refill team.   1. Which medications need to be refilled? (please list name of each medication and dose if known) Potassium, Lasix, Eliquis  2. Which pharmacy/location (including street and city if local pharmacy) is medication to be sent to? [facility]  3. Do they need a 30 day or 90 day supply? 90

## 2020-10-26 ENCOUNTER — Ambulatory Visit (HOSPITAL_BASED_OUTPATIENT_CLINIC_OR_DEPARTMENT_OTHER)
Admission: RE | Admit: 2020-10-26 | Discharge: 2020-10-26 | Disposition: A | Discharge status: Home / Self Care | Payer: Medicare HMO | Source: Home / Self Care | Attending: Nurse Practitioner | Admitting: Nurse Practitioner

## 2020-10-26 ENCOUNTER — Ambulatory Visit: Payer: Medicare HMO | Admitting: Anesthesiology

## 2020-10-26 ENCOUNTER — Other Ambulatory Visit: Payer: Self-pay

## 2020-10-26 ENCOUNTER — Emergency Department: Payer: Medicare HMO

## 2020-10-26 ENCOUNTER — Emergency Department
Admission: EM | Admit: 2020-10-26 | Discharge: 2020-10-27 | Disposition: A | Discharge status: Home / Self Care | Payer: Medicare HMO | Source: Home / Self Care | Attending: Emergency Medicine | Admitting: Emergency Medicine

## 2020-10-26 ENCOUNTER — Encounter
Admission: RE | Disposition: A | Discharge status: Home / Self Care | Payer: Self-pay | Source: Home / Self Care | Attending: Cardiovascular Disease

## 2020-10-26 ENCOUNTER — Encounter: Payer: Self-pay | Admitting: Emergency Medicine

## 2020-10-26 ENCOUNTER — Ambulatory Visit (EMERGENCY_DEPARTMENT_HOSPITAL)
Admission: RE | Admit: 2020-10-26 | Discharge: 2020-10-26 | Disposition: A | Discharge status: Home / Self Care | Payer: Medicare HMO | Source: Home / Self Care | Attending: Cardiovascular Disease | Admitting: Cardiovascular Disease

## 2020-10-26 DIAGNOSIS — I5043 Acute on chronic combined systolic (congestive) and diastolic (congestive) heart failure: Secondary | ICD-10-CM | POA: Insufficient documentation

## 2020-10-26 DIAGNOSIS — I4891 Unspecified atrial fibrillation: Secondary | ICD-10-CM | POA: Insufficient documentation

## 2020-10-26 DIAGNOSIS — Z7901 Long term (current) use of anticoagulants: Secondary | ICD-10-CM | POA: Insufficient documentation

## 2020-10-26 DIAGNOSIS — F17200 Nicotine dependence, unspecified, uncomplicated: Secondary | ICD-10-CM | POA: Insufficient documentation

## 2020-10-26 DIAGNOSIS — I340 Nonrheumatic mitral (valve) insufficiency: Secondary | ICD-10-CM

## 2020-10-26 DIAGNOSIS — F17210 Nicotine dependence, cigarettes, uncomplicated: Secondary | ICD-10-CM | POA: Insufficient documentation

## 2020-10-26 DIAGNOSIS — Z01818 Encounter for other preprocedural examination: Secondary | ICD-10-CM

## 2020-10-26 DIAGNOSIS — I361 Nonrheumatic tricuspid (valve) insufficiency: Secondary | ICD-10-CM | POA: Diagnosis not present

## 2020-10-26 DIAGNOSIS — I110 Hypertensive heart disease with heart failure: Secondary | ICD-10-CM | POA: Insufficient documentation

## 2020-10-26 DIAGNOSIS — R0602 Shortness of breath: Secondary | ICD-10-CM

## 2020-10-26 DIAGNOSIS — I081 Rheumatic disorders of both mitral and tricuspid valves: Secondary | ICD-10-CM | POA: Insufficient documentation

## 2020-10-26 DIAGNOSIS — I509 Heart failure, unspecified: Secondary | ICD-10-CM | POA: Insufficient documentation

## 2020-10-26 DIAGNOSIS — I5033 Acute on chronic diastolic (congestive) heart failure: Secondary | ICD-10-CM | POA: Insufficient documentation

## 2020-10-26 DIAGNOSIS — Z7951 Long term (current) use of inhaled steroids: Secondary | ICD-10-CM | POA: Insufficient documentation

## 2020-10-26 DIAGNOSIS — J449 Chronic obstructive pulmonary disease, unspecified: Secondary | ICD-10-CM | POA: Insufficient documentation

## 2020-10-26 DIAGNOSIS — R0789 Other chest pain: Secondary | ICD-10-CM

## 2020-10-26 DIAGNOSIS — E785 Hyperlipidemia, unspecified: Secondary | ICD-10-CM | POA: Insufficient documentation

## 2020-10-26 DIAGNOSIS — Z79899 Other long term (current) drug therapy: Secondary | ICD-10-CM | POA: Insufficient documentation

## 2020-10-26 DIAGNOSIS — J441 Chronic obstructive pulmonary disease with (acute) exacerbation: Secondary | ICD-10-CM | POA: Insufficient documentation

## 2020-10-26 DIAGNOSIS — I700 Atherosclerosis of aorta: Secondary | ICD-10-CM | POA: Insufficient documentation

## 2020-10-26 DIAGNOSIS — I4819 Other persistent atrial fibrillation: Secondary | ICD-10-CM | POA: Insufficient documentation

## 2020-10-26 HISTORY — PX: CARDIOVERSION: SHX1299

## 2020-10-26 HISTORY — PX: TEE WITHOUT CARDIOVERSION: SHX5443

## 2020-10-26 LAB — COMPREHENSIVE METABOLIC PANEL
ALT: 62 U/L — ABNORMAL HIGH (ref 0–44)
AST: 85 U/L — ABNORMAL HIGH (ref 15–41)
Albumin: 3.6 g/dL (ref 3.5–5.0)
Alkaline Phosphatase: 64 U/L (ref 38–126)
Anion gap: 10 (ref 5–15)
BUN: 20 mg/dL (ref 8–23)
CO2: 28 mmol/L (ref 22–32)
Calcium: 9.6 mg/dL (ref 8.9–10.3)
Chloride: 99 mmol/L (ref 98–111)
Creatinine, Ser: 1.03 mg/dL — ABNORMAL HIGH (ref 0.44–1.00)
GFR, Estimated: 59 mL/min — ABNORMAL LOW (ref >60)
Glucose, Bld: 145 mg/dL — ABNORMAL HIGH (ref 70–99)
Potassium: 3.6 mmol/L (ref 3.5–5.1)
Sodium: 137 mmol/L (ref 135–145)
Total Bilirubin: 1 mg/dL (ref 0.3–1.2)
Total Protein: 6.3 g/dL — ABNORMAL LOW (ref 6.5–8.1)

## 2020-10-26 LAB — CBC WITH DIFFERENTIAL/PLATELET
Abs Immature Granulocytes: 0.02 10*3/uL (ref 0.00–0.07)
Basophils Absolute: 0 10*3/uL (ref 0.0–0.1)
Basophils Relative: 1 %
Eosinophils Absolute: 0.1 10*3/uL (ref 0.0–0.5)
Eosinophils Relative: 2 %
HCT: 38.9 % (ref 36.0–46.0)
Hemoglobin: 13.2 g/dL (ref 12.0–15.0)
Immature Granulocytes: 0 %
Lymphocytes Relative: 19 %
Lymphs Abs: 1.1 10*3/uL (ref 0.7–4.0)
MCH: 33.8 pg (ref 26.0–34.0)
MCHC: 33.9 g/dL (ref 30.0–36.0)
MCV: 99.7 fL (ref 80.0–100.0)
Monocytes Absolute: 0.7 10*3/uL (ref 0.1–1.0)
Monocytes Relative: 12 %
Neutro Abs: 3.7 10*3/uL (ref 1.7–7.7)
Neutrophils Relative %: 66 %
Platelets: 201 10*3/uL (ref 150–400)
RBC: 3.9 MIL/uL (ref 3.87–5.11)
RDW: 13 % (ref 11.5–15.5)
WBC: 5.6 10*3/uL (ref 4.0–10.5)
nRBC: 0 % (ref 0.0–0.2)

## 2020-10-26 LAB — BRAIN NATRIURETIC PEPTIDE: B Natriuretic Peptide: 522 pg/mL — ABNORMAL HIGH (ref 0.0–100.0)

## 2020-10-26 LAB — TROPONIN I (HIGH SENSITIVITY): Troponin I (High Sensitivity): 19 ng/L — ABNORMAL HIGH (ref <18)

## 2020-10-26 SURGERY — ECHOCARDIOGRAM, TRANSESOPHAGEAL
Anesthesia: General

## 2020-10-26 MED ORDER — SODIUM CHLORIDE 0.9 % IV SOLN: INTRAVENOUS | Status: DC

## 2020-10-26 MED ORDER — PREDNISONE 50 MG PO TABS: 50.0000 mg | ORAL_TABLET | Freq: Every day | ORAL | 0 refills | Status: DC

## 2020-10-26 MED ORDER — FUROSEMIDE 10 MG/ML IJ SOLN
40.0000 mg | Freq: Once | INTRAMUSCULAR | Status: AC
  Administered 2020-10-26: 40 mg via INTRAVENOUS
  Filled 2020-10-26: qty 4

## 2020-10-26 MED ORDER — BUTAMBEN-TETRACAINE-BENZOCAINE 2-2-14 % EX AERO
INHALATION_SPRAY | CUTANEOUS | Status: AC
  Filled 2020-10-26: qty 5

## 2020-10-26 MED ORDER — PROPOFOL 500 MG/50ML IV EMUL
INTRAVENOUS | Status: AC
  Filled 2020-10-26: qty 50

## 2020-10-26 MED ORDER — PHENYLEPHRINE HCL (PRESSORS) 10 MG/ML IV SOLN
INTRAVENOUS | Status: AC
  Filled 2020-10-26: qty 1

## 2020-10-26 MED ORDER — METHYLPREDNISOLONE SODIUM SUCC 125 MG IJ SOLR
125.0000 mg | Freq: Once | INTRAMUSCULAR | Status: AC
  Administered 2020-10-26: 125 mg via INTRAVENOUS
  Filled 2020-10-26: qty 2

## 2020-10-26 MED ORDER — LIDOCAINE VISCOUS HCL 2 % MT SOLN
OROMUCOSAL | Status: AC
  Filled 2020-10-26: qty 15

## 2020-10-26 MED ORDER — IPRATROPIUM-ALBUTEROL 0.5-2.5 (3) MG/3ML IN SOLN
6.0000 mL | Freq: Once | RESPIRATORY_TRACT | Status: AC
  Administered 2020-10-26: 6 mL via RESPIRATORY_TRACT
  Filled 2020-10-26: qty 6

## 2020-10-26 MED ORDER — PROPOFOL 10 MG/ML IV BOLUS
INTRAVENOUS | Status: DC | PRN
  Administered 2020-10-26: 10 mg via INTRAVENOUS
  Administered 2020-10-26 (×3): 20 mg via INTRAVENOUS
  Administered 2020-10-26: 40 mg via INTRAVENOUS

## 2020-10-26 MED ORDER — EPHEDRINE SULFATE 50 MG/ML IJ SOLN
INTRAMUSCULAR | Status: DC | PRN
  Administered 2020-10-26: 10 mg via INTRAVENOUS
  Administered 2020-10-26 (×2): 5 mg via INTRAVENOUS

## 2020-10-26 MED ORDER — PHENYLEPHRINE HCL (PRESSORS) 10 MG/ML IV SOLN
INTRAVENOUS | Status: DC | PRN
  Administered 2020-10-26 (×3): 100 ug via INTRAVENOUS

## 2020-10-26 NOTE — Progress Notes (Signed)
*  PRELIMINARY RESULTS* Echocardiogram Echocardiogram Transesophageal has been performed.  Cristela Blue 10/26/2020, 8:11 AM

## 2020-10-26 NOTE — ED Provider Notes (Signed)
[facility] Emergency Department Provider Note ____________________________________________   Event Date/Time   First MD Initiated Contact with Patient 10/26/20 2209     (approximate)  I have reviewed the triage vital signs and the nursing notes.  HISTORY  Chief Complaint Chest Pain   HPI Angela Singh is a 70 y.o. femalewho presents to the ED for evaluation of shortness of breath.   Chart review indicates patient just had a sedated TEE cardioversion this morning with Dr. Mariah Milling due to persistent atrial fibrillation, with synchronized cardioversion at 150 J x 1, subsequently converted to a normal sinus rhythm.  She presents today to the ED for evaluation of shortness of breath primarily.  She was triaged as chest pain, but she says the tightness is not bad, it is mostly my breathing.  She reports feeling short of breath this morning upon awakening from anesthesia, but she was reassured with likely side effects of these medications.  She reports increasing shortness of breath throughout the day today with minimal sputum production.  She reports not being able to eat dinner fully due to her dyspnea.  Reports lingering chest tightness that is mild, 2/10 intensity, that she thought was due to the cardioversion.  Denies any syncopal episodes, palpitations, dizziness, falls or trauma.  Denies emesis or abdominal pain.   Past Medical History:  Diagnosis Date   COPD (chronic obstructive pulmonary disease) (HCC)    Essential hypertension    GERD (gastroesophageal reflux disease)    History of echocardiogram    a. 09/2020 Echo: Ef 60-65%. No rwma. Nl RV fxn. RVSP 38.70mmHg. Mild to mod MR. Mod TR.   Mixed hyperlipidemia    Persistent atrial fibrillation (HCC)    a. Dx 09/2020. CHA2DS2VASc = 3-->Eliquis.   Tobacco abuse     Patient Active Problem List   Diagnosis Date Noted   Digoxin toxicity 10/12/2020   Elevated troponin 10/12/2020   COPD (chronic obstructive  pulmonary disease) (HCC)    Chronic diastolic CHF (congestive heart failure) (HCC)    Hypokalemia    Persistent atrial fibrillation (HCC)    Weakness    Acute diastolic CHF (congestive heart failure) (HCC)    Depression    Essential hypertension 09/22/2020   Hyperlipidemia 09/22/2020   GERD (gastroesophageal reflux disease) 09/22/2020   COPD exacerbation (HCC) 09/22/2020   Tobacco abuse 09/22/2020   Atrial fibrillation with RVR (HCC) 09/21/2020    Past Surgical History:  Procedure Laterality Date   TEE WITHOUT CARDIOVERSION N/A 09/25/2020   Procedure: TRANSESOPHAGEAL ECHOCARDIOGRAM (TEE);  Surgeon: Iran Ouch, MD;  Location: [facility] ORS;  Service: Cardiovascular;  Laterality: N/A;    Prior to Admission medications   Medication Sig Start Date End Date Taking? Authorizing Provider  apixaban (ELIQUIS) 5 MG TABS tablet Take 1 tablet (5 mg total) by mouth 2 (two) times daily. 10/25/20  Yes End, Cristal Deer, MD  ascorbic acid (VITAMIN C) 500 MG tablet Take 1,000 mg by mouth 2 (two) times daily.   Yes [provider]  atorvastatin (LIPITOR) 20 MG tablet Take 20 mg by mouth daily. 08/03/20  Yes [provider]  diltiazem (CARDIZEM CD) 180 MG 24 hr capsule Take 1 capsule (180 mg total) by mouth daily. 10/23/20 01/21/21 Yes Creig Hines, NP  furosemide (LASIX) 40 MG tablet Take 1 tablet (40 mg total) by mouth daily. 10/25/20  Yes End, Cristal Deer, MD  metoprolol tartrate (LOPRESSOR) 25 MG tablet Take 1 tablet (25 mg total) by mouth 2 (two) times  daily. 10/13/20  Yes Arnetha Courser, MD  Multiple Vitamins-Minerals (ONE-A-DAY WOMENS 50+ PO) Take 1 tablet by mouth daily.   Yes [provider]  pantoprazole (PROTONIX) 40 MG tablet Take 40 mg by mouth daily. 09/11/20  Yes [provider]  potassium chloride SA (KLOR-CON) 20 MEQ tablet Take 1 tablet (20 mEq total) by mouth daily. 10/25/20  Yes End, Cristal Deer, MD  predniSONE (DELTASONE) 50 MG tablet Take 1  tablet (50 mg total) by mouth daily for 4 days. 10/26/20 10/30/20 Yes Delton Prairie, MD  SYMBICORT 160-4.5 MCG/ACT inhaler Inhale 1 puff into the lungs 2 (two) times daily in the am and at bedtime.. 08/08/20  Yes [provider]  vitamin E 180 MG (400 UNITS) capsule Take 400 Units by mouth 2 (two) times daily.   Yes [provider]  albuterol (VENTOLIN HFA) 108 (90 Base) MCG/ACT inhaler Inhale 2 puffs into the lungs every 4 (four) hours as needed. Patient not taking: No sig reported 08/09/20   [provider]  FLUoxetine (PROZAC) 40 MG capsule Take 40 mg by mouth daily. Patient not taking: No sig reported 08/03/20   [provider]    Allergies Codeine  Family History  Problem Relation Age of Onset   COPD Mother    Heart failure Father    Heart disease Sister     Social History Social History   Tobacco Use   Smoking status: Every Day    Packs/day: 1.00    Years: 48.00    Pack years: 48.00    Types: Cigarettes   Smokeless tobacco: Never  Substance Use Topics   Alcohol use: Not Currently   Drug use: Never    Review of Systems  Constitutional: No fever/chills Eyes: No visual changes. ENT: No sore throat. Cardiovascular: Positive for chest pain. Respiratory: Positive for shortness of breath and cough. Gastrointestinal: No abdominal pain.  No nausea, no vomiting.  No diarrhea.  No constipation. Genitourinary: Negative for dysuria. Musculoskeletal: Negative for back pain. Skin: Negative for rash. Neurological: Negative for headaches, focal weakness or numbness.   ____________________________________________   PHYSICAL EXAM:  VITAL SIGNS: Vitals:   10/26/20 2211  BP: 101/83  Pulse: 62  Resp: 20  Temp: 98 F (36.7 C)  SpO2: 94%     Constitutional: Alert and oriented. Well appearing and in no acute distress.  Sitting upright in bed.  Minimal conversation tachypnea is noted. Eyes: Conjunctivae are normal. PERRL. EOMI. Head:  Atraumatic. Nose: No congestion/rhinnorhea. Mouth/Throat: Mucous membranes are moist.  Oropharynx non-erythematous. Neck: No stridor. No cervical spine tenderness to palpation. Cardiovascular: Normal rate, regular rhythm. Grossly normal heart sounds.  Good peripheral circulation. Respiratory: Minimal tachypnea to the low 20s.  No distress.  Diffuse expiratory wheezes and slight decrease in air movement throughout. Gastrointestinal: Soft , nondistended, nontender to palpation. No CVA tenderness. Musculoskeletal: No lower extremity tenderness nor edema.  No joint effusions. No signs of acute trauma. Neurologic:  Normal speech and language. No gross focal neurologic deficits are appreciated. No gait instability noted. Skin:  Skin is warm, dry and intact. No rash noted. Psychiatric: Mood and affect are normal. Speech and behavior are normal. ____________________________________________   LABS (all labs ordered are listed, but only abnormal results are displayed)  Labs Reviewed  CBC WITH DIFFERENTIAL/PLATELET  COMPREHENSIVE METABOLIC PANEL  BRAIN NATRIURETIC PEPTIDE  TROPONIN I (HIGH SENSITIVITY)   ____________________________________________  12 Lead EKG  Sinus rhythm with a rate of 55 bpm.  Normal axis and intervals.  Nonspecific  ST changes laterally with slightly biphasic T waves to V6.  No STEMI. When reviewing her old EKGs, including the A. fib with RVR from this morning, she had inverted and biphasic T waves laterally at that point. ____________________________________________  RADIOLOGY  ED MD interpretation:  cxr with mild pulmonary vascular congestion  Official radiology report(s): DG Chest Portable 1 View  Result Date: 10/26/2020 CLINICAL DATA:  Shortness of breath and wheezing. Cardioversion this morning. EXAM: PORTABLE CHEST 1 VIEW COMPARISON:  10/12/2020 FINDINGS: Mild cardiac enlargement. Mild vascular congestion. Slight interstitial pattern in the lung bases may  indicate early edema. This is progressing since the prior study. No focal consolidation. Suggestion of tiny right pleural effusion. No pneumothorax. Calcified granulomas in the apices. Calcification of the aorta. IMPRESSION: Mild cardiac enlargement and mild pulmonary vascular congestion. Suggestion of developing interstitial edema in the lung bases. Possible small right pleural effusion. Electronically Signed   By: Burman Nieves M.D.   On: 10/26/2020 22:26   ECHO TEE  Result Date: 10/26/2020    TRANSESOPHOGEAL ECHO REPORT   Patient Name:   JAYCELYNN KNICKERBOCKER Fallin Date of Exam: 10/26/2020 Medical Rec #:  [id number]      Height:       64.0 in Accession #:    [id number]     Weight:       110.0 lb Date of Birth:  [birth year]      BSA:          1.517 m Patient Age:    69 years       BP:           108/64 mmHg Patient Gender: F              HR:           58 bpm. Exam Location:  [facility] Procedure: Transesophageal Echo, Cardiac Doppler, Color Doppler and Saline            Contrast Bubble Study Indications:     Atrial Fibrillation I48.91  History:         Patient has prior history of Echocardiogram examinations, most                  recent 09/25/2020. COPD, Arrythmias:Atrial Fibrillation; Risk                  Factors:Hypertension.  Sonographer:     Cristela Blue Referring Phys:  [id number] CHRISTOPHER RONALD BERGE Diagnosing Phys: Julien Nordmann MD PROCEDURE: After discussion of the risks and benefits of a TEE, an informed consent was obtained from the patient. The transesophogeal probe was passed without difficulty through the esophogus of the patient. Imaged were obtained with the patient in a left lateral decubitus position. Local oropharyngeal anesthetic was provided with Benzocaine spray and viscous lidocaine. Sedation performed by different physician. Image quality was good. The patient's vital signs; including heart rate, blood pressure, and oxygen saturation; remained stable throughout the procedure. The patient developed no  complications during the procedure. A successful direct current cardioversion was performed at 150 joules with 1 attempt. IMPRESSIONS  1. Left ventricular ejection fraction, by estimation, is 45 to 50%. The left ventricle has mildly decreased function. The left ventricle has no regional wall motion abnormalities. There is mild left ventricular hypertrophy. Left ventricular diastolic parameters are indeterminate.  2. Right ventricular systolic function is normal. The right ventricular size is normal. There is normal pulmonary artery systolic pressure. The estimated right ventricular systolic pressure is 29.2 mmHg.  3. The  mitral valve is normal in structure. Mild to moderate mitral valve regurgitation. No evidence of mitral stenosis.  4. Tricuspid valve regurgitation is mild to moderate.  5. The aortic valve is normal in structure. Aortic valve regurgitation is not visualized. Mild aortic valve sclerosis is present, with no evidence of aortic valve stenosis.  6. The inferior vena cava is normal in size with greater than 50% respiratory variability, suggesting right atrial pressure of 3 mmHg.  7. Agitated saline contrast bubble study was positive with shunting observed within 3-6 cardiac cycles suggestive of interatrial shunt. There is a small patent foramen ovale with predominantly right to left shunting across the atrial septum.  8. Rhythm is atrial fibrillation.  9. Left atrial size was moderately dilated. No left atrial/left atrial appendage thrombus was detected. 10. Right atrial size was moderately dilated. 11. There is mild (Grade II) atheroma plaque involving the descending aorta. Conclusion(s)/Recommendation(s): Normal biventricular function without evidence of hemodynamically significant valvular heart disease. FINDINGS  Left Ventricle: Left ventricular ejection fraction, by estimation, is 45 to 50%. The left ventricle has mildly decreased function. The left ventricle has no regional wall motion  abnormalities. The left ventricular internal cavity size was normal in size. There is mild left ventricular hypertrophy. Left ventricular diastolic parameters are indeterminate. Right Ventricle: The right ventricular size is normal. No increase in right ventricular wall thickness. Right ventricular systolic function is normal. There is normal pulmonary artery systolic pressure. The tricuspid regurgitant velocity is 2.46 m/s, and  with an assumed right atrial pressure of 5 mmHg, the estimated right ventricular systolic pressure is 29.2 mmHg. Left Atrium: Left atrial size was moderately dilated. No left atrial/left atrial appendage thrombus was detected. Right Atrium: Right atrial size was moderately dilated. Pericardium: There is no evidence of pericardial effusion. Mitral Valve: The mitral valve is normal in structure. Mild to moderate mitral valve regurgitation. No evidence of mitral valve stenosis. Tricuspid Valve: The tricuspid valve is normal in structure. Tricuspid valve regurgitation is mild to moderate. No evidence of tricuspid stenosis. Aortic Valve: The aortic valve is normal in structure. Aortic valve regurgitation is not visualized. Mild aortic valve sclerosis is present, with no evidence of aortic valve stenosis. Pulmonic Valve: The pulmonic valve was normal in structure. Pulmonic valve regurgitation is not visualized. No evidence of pulmonic stenosis. Aorta: The aortic root is normal in size and structure. There is mild (Grade II) atheroma plaque involving the descending aorta. Venous: The inferior vena cava is normal in size with greater than 50% respiratory variability, suggesting right atrial pressure of 3 mmHg. IAS/Shunts: No atrial level shunt detected by color flow Doppler. Agitated saline contrast was given intravenously to evaluate for intracardiac shunting. Agitated saline contrast bubble study was positive with shunting observed within 3-6 cardiac cycles suggestive of interatrial shunt. A  small patent foramen ovale is detected with predominantly right to left shunting across the atrial septum.  TRICUSPID VALVE TR Peak grad:   24.2 mmHg TR Vmax:        246.00 cm/s Julien Nordmann MD Electronically signed by Julien Nordmann MD Signature Date/Time: 10/26/2020/4:33:50 PM    Final     ____________________________________________   PROCEDURES and INTERVENTIONS  Procedure(s) performed (including Critical Care):  .1-3 Lead EKG Interpretation Performed by: Delton Prairie, MD Authorized by: Delton Prairie, MD     Interpretation: normal     ECG rate:  56   ECG rate assessment: bradycardic     Rhythm: sinus bradycardia     Ectopy: none  Conduction: normal    Medications  ipratropium-albuterol (DUONEB) 0.5-2.5 (3) MG/3ML nebulizer solution 6 mL (6 mLs Nebulization Given 10/26/20 2221)  methylPREDNISolone sodium succinate (SOLU-MEDROL) 125 mg/2 mL injection 125 mg (125 mg Intravenous Given 10/26/20 2221)    ____________________________________________   MDM / ED COURSE   70 year old woman who was just cardioverted this morning for atrial fibrillation presents to the ED with shortness of breath, with evidence of a COPD exacerbation.  Normal vitals.  No hypoxia or instability.  EKG is reassuring demonstrating sinus rhythm without recurrence of atrial fibrillation or ischemic features.  First troponin is marginally elevated, though much less than in the past.  Clinically, she is having a COPD exacerbation, and does have resolving symptoms after breathing treatment.  Patient signed out to oncoming provider to follow-up on the remainder of her work-up with anticipation of outpatient management.  Clinical Course as of 10/26/20 2251  Thu Oct 26, 2020  2249 Reassessed.  Patient looks more comfortable.  Tachypnea has resolved.  Wheezing has resolved on auscultation.  She reports feeling much better.  We discussed likely COPD exacerbation causing her symptoms.  We discussed need for remainder of  cardiac testing, but my anticipation is outpatient management.  She is in agreement. She will be signed out to oncoming provider to follow-up on the remainder of his blood work. [DS]    Clinical Course User Index [DS] Delton Prairie, MD    ____________________________________________   FINAL CLINICAL IMPRESSION(S) / ED DIAGNOSES  Final diagnoses:  COPD exacerbation (HCC)  Shortness of breath  Other chest pain     ED Discharge Orders          Ordered    predniSONE (DELTASONE) 50 MG tablet  Daily        10/26/20 2251             Tarissa Kerin   Note:  This document was prepared using Dragon voice recognition software and may include unintentional dictation errors.    Delton Prairie, MD 10/26/20 2258

## 2020-10-26 NOTE — Discharge Instructions (Addendum)
Please take prednisone steroids once daily for the next 4 days to finish a 5-day course to treat your COPD exacerbation.  Pick up your steroids and take them each day in the nighttime, because we gave you steroids in the IV this evening.

## 2020-10-26 NOTE — Anesthesia Postprocedure Evaluation (Signed)
Anesthesia Post Note  Patient: Angela Singh  Procedure(s) Performed: TRANSESOPHAGEAL ECHOCARDIOGRAM (TEE) CARDIOVERSION  Patient location during evaluation: PACU Anesthesia Type: General Level of consciousness: awake and alert Pain management: pain level controlled Vital Signs Assessment: post-procedure vital signs reviewed and stable Respiratory status: spontaneous breathing, nonlabored ventilation, respiratory function stable and patient connected to nasal cannula oxygen Cardiovascular status: blood pressure returned to baseline and stable Postop Assessment: no apparent nausea or vomiting Anesthetic complications: no   No notable events documented.   Last Vitals:  Vitals:   10/26/20 0845 10/26/20 0855  BP: (!) 98/51 (!) 106/58  Pulse: (!) 57 (!) 55  Resp: (!) 23 17  Temp:    SpO2: 96% 94%    Last Pain:  Vitals:   10/26/20 0855  TempSrc:   PainSc: 0-No pain                 Calyn Sivils Areatha Keas

## 2020-10-26 NOTE — ED Notes (Signed)
Bedside commode/call bell within reach

## 2020-10-26 NOTE — Anesthesia Preprocedure Evaluation (Addendum)
Anesthesia Evaluation  Patient identified by MRN, date of birth, ID band Patient awake    Reviewed: Allergy & Precautions, NPO status , Patient's Chart, lab work & pertinent test results  History of Anesthesia Complications Negative for: history of anesthetic complications  Airway Mallampati: II  TM Distance: <3 FB Neck ROM: Full    Dental   Pulmonary COPD, Current Smoker,    Pulmonary exam normal        Cardiovascular hypertension, +CHF (diastolic)  + dysrhythmias (a-fib)  Rhythm:Irregular Rate:Normal - Systolic murmurs    Neuro/Psych    GI/Hepatic Neg liver ROS, GERD  ,  Endo/Other  negative endocrine ROS  Renal/GU negative Renal ROS  negative genitourinary   Musculoskeletal negative musculoskeletal ROS (+)   Abdominal   Peds  Hematology negative hematology ROS (+)   Anesthesia Other Findings EKG 10/26/20 Atrial fibrillation Consider RVH w/ secondary repol abnormality Probable anteroseptal infarct, old Repol abnrm suggests ischemia, diffuse leads  TEE 09/25/20 1. Left ventricular ejection fraction, by estimation, is 55 to 60%. The  left ventricle has normal function. The left ventricle has no regional  wall motion abnormalities.  2. Right ventricular systolic function is normal. The right ventricular  size is normal. There is normal pulmonary artery systolic pressure.  3. Left atrial size was mildly dilated. 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 Small mobile thrombus in mid LAA  4. Right atrial size was mildly dilated.  5. The mitral valve is normal in structure. Mild mitral valve  regurgitation. No evidence of mitral stenosis.  6. Tricuspid valve regurgitation is mild to moderate.  7. The aortic valve is tricuspid. Aortic valve regurgitation is not  visualized. No aortic stenosis is present.  8. There is Moderate (Grade III) plaque involving the descending aorta.  9.  Agitated saline contrast bubble study was negative, with no evidence  of any interatrial shunt.   Reproductive/Obstetrics                            Anesthesia Physical Anesthesia Plan  ASA: 3  Anesthesia Plan: General   Post-op Pain Management:    Induction:   PONV Risk Score and Plan:   Airway Management Planned: Natural Airway  Additional Equipment:   Intra-op Plan:   Post-operative Plan:   Informed Consent: I have reviewed the patients History and Physical, chart, labs and discussed the procedure including the risks, benefits and alternatives for the proposed anesthesia with the patient or authorized representative who has indicated his/her understanding and acceptance.       Plan Discussed with: Anesthesiologist and CRNA  Anesthesia Plan Comments:         Anesthesia Quick Evaluation

## 2020-10-26 NOTE — ED Triage Notes (Addendum)
Pt arrived via [facility] from home with reports of chest pressure that is non-radiating, had a cardioversion done this AM. Pt has hx of HF, COPD, afib  Pt on 2L [place] on arrival for comfort 98%  Pt has rash noted to bilateral upper extremities.

## 2020-10-26 NOTE — Progress Notes (Signed)
Transesophageal Echocardiogram :  Indication: atrial/ fibrillation with RVR Requesting/ordering  physician:   Procedure: Benzocaine spray x2 and 2 mls x 2 of viscous lidocaine were given orally to provide local anesthesia to the oropharynx. The patient was positioned supine on the left side, bite block provided. The patient was moderately sedated with the doses of versed and fentanyl as detailed below.  Using digital technique an omniplane probe was advanced into the distal esophagus without incident.   Moderate sedation: 1. Sedation used:  per anesthesia   See report in EPIC  for complete details: In brief, transgastric imaging revealed normal LV function with no RWMAs and no mural apical thrombus.  .  Estimated ejection fraction was 55%.  Right sided cardiac chambers were normal with no evidence of pulmonary hypertension.  Imaging of the septum showed no ASD or VSD Bubble study was positive for small shunt 2D and color flow confirmed small PFO  Mild to moderate MR and TR  The LA was well visualized in orthogonal views.  There was no spontaneous contrast and no thrombus in the LA and LA appendage   The descending thoracic aorta had mild to moderate diffuse aortic atherosclerosis with no evidence of aneurysmal dilation or disection   Julien Nordmann 10/26/2020 8:17 AM

## 2020-10-26 NOTE — CV Procedure (Signed)
Cardioversion procedure note For atrial fibrillation, persistent with RVR  Procedure Details:  Consent: Risks of procedure as well as the alternatives and risks of each were explained to the (patient/caregiver).  Consent for procedure obtained.  Time Out: Verified patient identification, verified procedure, site/side was marked, verified correct patient position, special equipment/implants available, medications/allergies/relevent history reviewed, required imaging and test results available.  Performed  Patient placed on cardiac monitor, pulse oximetry, supplemental oxygen as necessary.   Sedation given: propofol IV, Dr. Laurence Compton Pacer pads placed anterior and posterior chest.   Cardioverted 1 time(s).   Cardioverted at  150 J. Synchronized biphasic Converted to NSR   Evaluation: Findings: Post procedure EKG shows: NSR Complications: None Patient did tolerate procedure well.  Time Spent Directly with the Patient:  45 minutes   Angela Singh, M.D., Ph.D.

## 2020-10-26 NOTE — Transfer of Care (Signed)
Immediate Anesthesia Transfer of Care Note  Patient: Angela Singh  Procedure(s) Performed: TRANSESOPHAGEAL ECHOCARDIOGRAM (TEE) CARDIOVERSION  Patient Location: Short Stay  Anesthesia Type:General  Level of Consciousness: awake, alert  and oriented  Airway & Oxygen Therapy: Patient Spontanous Breathing and Patient connected to nasal cannula oxygen  Post-op Assessment: Report given to RN and Post -op Vital signs reviewed and stable  Post vital signs: Reviewed and stable  Last Vitals:  Vitals Value Taken Time  BP 108/64 10/26/20 0806  Temp    Pulse 58 10/26/20 0808  Resp 16 10/26/20 0808  SpO2 97 % 10/26/20 0808    Last Pain:  Vitals:   10/26/20 0706  TempSrc: Oral  PainSc: 0-No pain         Complications: No notable events documented.

## 2020-10-26 NOTE — Telephone Encounter (Signed)
Patient currently having procedure done today. Will try to call later today.

## 2020-10-27 ENCOUNTER — Encounter: Payer: Self-pay | Admitting: Cardiovascular Disease

## 2020-10-27 LAB — TROPONIN I (HIGH SENSITIVITY): Troponin I (High Sensitivity): 16 ng/L (ref <18)

## 2020-10-27 MED ORDER — AZITHROMYCIN 250 MG PO TABS: ORAL_TABLET | ORAL | 0 refills | Status: DC

## 2020-10-27 MED ORDER — AZITHROMYCIN 500 MG PO TABS
500.0000 mg | ORAL_TABLET | Freq: Once | ORAL | Status: AC
  Administered 2020-10-27: 500 mg via ORAL
  Filled 2020-10-27: qty 1

## 2020-10-27 NOTE — ED Notes (Signed)
Pt alert, still attempting to reach someone to come and get her.

## 2020-10-27 NOTE — ED Notes (Signed)
Pt ambulated in hallway. Pt remained on room air. Pre ambulation saturations 91-92%, ambulating saturations 91-94%, after returning to bed saturations 92%. HR maintained b/w 72-80 BPM. Pt reported she feels her symptoms are improved since arrival.

## 2020-10-27 NOTE — ED Notes (Signed)
Pt is trying to get in touch with her ride to come pick her up for discharge.

## 2020-10-27 NOTE — ED Provider Notes (Signed)
I was asked by Dr. Katrinka Blazing to follow-up results of repeat troponin and reassess patient's breathing status.  Repeat troponin is negative.  On evaluation patient reports that she feels markedly improved and feels like her breathing is back to baseline.  She is moving good air with no wheezing or crackles on examination.  Her sats are between 91 and 94% both at rest and with ambulation.  Discussed admission with her for low sats however patient reports that she feels back to normal and wishes to go home.  She reports that she will return to the emergency room if she is having trouble breathing at home.  I will send her home on steroids, she has an albuterol inhaler, and a Z-Pak.  Recommended monitoring her oxygen at home and returning for sats lower than 90%, chest pain, or shortness of breath.  Otherwise she is going to follow-up with her PCP in 2 days.   Don Perking, [place], MD 10/27/20 [phone]

## 2020-10-29 ENCOUNTER — Inpatient Hospital Stay
Admission: EM | Admit: 2020-10-29 | Discharge: 2020-11-03 | DRG: 291 | Disposition: A | Discharge status: Home / Self Care | Payer: Medicare HMO | Attending: Hospitalist | Admitting: Hospitalist

## 2020-10-29 ENCOUNTER — Emergency Department: Payer: Medicare HMO

## 2020-10-29 DIAGNOSIS — Z720 Tobacco use: Secondary | ICD-10-CM | POA: Diagnosis present

## 2020-10-29 DIAGNOSIS — Z7952 Long term (current) use of systemic steroids: Secondary | ICD-10-CM | POA: Diagnosis not present

## 2020-10-29 DIAGNOSIS — A419 Sepsis, unspecified organism: Secondary | ICD-10-CM | POA: Diagnosis present

## 2020-10-29 DIAGNOSIS — R945 Abnormal results of liver function studies: Secondary | ICD-10-CM | POA: Diagnosis not present

## 2020-10-29 DIAGNOSIS — E782 Mixed hyperlipidemia: Secondary | ICD-10-CM | POA: Diagnosis present

## 2020-10-29 DIAGNOSIS — Z8249 Family history of ischemic heart disease and other diseases of the circulatory system: Secondary | ICD-10-CM

## 2020-10-29 DIAGNOSIS — I4891 Unspecified atrial fibrillation: Secondary | ICD-10-CM | POA: Diagnosis present

## 2020-10-29 DIAGNOSIS — I10 Essential (primary) hypertension: Secondary | ICD-10-CM | POA: Diagnosis present

## 2020-10-29 DIAGNOSIS — I509 Heart failure, unspecified: Secondary | ICD-10-CM | POA: Diagnosis not present

## 2020-10-29 DIAGNOSIS — R0600 Dyspnea, unspecified: Secondary | ICD-10-CM

## 2020-10-29 DIAGNOSIS — E876 Hypokalemia: Secondary | ICD-10-CM | POA: Diagnosis not present

## 2020-10-29 DIAGNOSIS — I4819 Other persistent atrial fibrillation: Secondary | ICD-10-CM | POA: Diagnosis present

## 2020-10-29 DIAGNOSIS — I110 Hypertensive heart disease with heart failure: Secondary | ICD-10-CM | POA: Diagnosis present

## 2020-10-29 DIAGNOSIS — I700 Atherosclerosis of aorta: Secondary | ICD-10-CM | POA: Diagnosis present

## 2020-10-29 DIAGNOSIS — I5023 Acute on chronic systolic (congestive) heart failure: Secondary | ICD-10-CM | POA: Diagnosis not present

## 2020-10-29 DIAGNOSIS — J441 Chronic obstructive pulmonary disease with (acute) exacerbation: Secondary | ICD-10-CM | POA: Diagnosis present

## 2020-10-29 DIAGNOSIS — Z79899 Other long term (current) drug therapy: Secondary | ICD-10-CM

## 2020-10-29 DIAGNOSIS — Q211 Atrial septal defect: Secondary | ICD-10-CM | POA: Diagnosis not present

## 2020-10-29 DIAGNOSIS — Z7951 Long term (current) use of inhaled steroids: Secondary | ICD-10-CM

## 2020-10-29 DIAGNOSIS — Z20822 Contact with and (suspected) exposure to covid-19: Secondary | ICD-10-CM | POA: Diagnosis present

## 2020-10-29 DIAGNOSIS — Z7901 Long term (current) use of anticoagulants: Secondary | ICD-10-CM | POA: Diagnosis not present

## 2020-10-29 DIAGNOSIS — F17210 Nicotine dependence, cigarettes, uncomplicated: Secondary | ICD-10-CM | POA: Diagnosis present

## 2020-10-29 DIAGNOSIS — K219 Gastro-esophageal reflux disease without esophagitis: Secondary | ICD-10-CM | POA: Diagnosis present

## 2020-10-29 DIAGNOSIS — K761 Chronic passive congestion of liver: Secondary | ICD-10-CM | POA: Diagnosis present

## 2020-10-29 DIAGNOSIS — I5033 Acute on chronic diastolic (congestive) heart failure: Secondary | ICD-10-CM | POA: Diagnosis present

## 2020-10-29 DIAGNOSIS — R0603 Acute respiratory distress: Secondary | ICD-10-CM | POA: Diagnosis not present

## 2020-10-29 DIAGNOSIS — R7989 Other specified abnormal findings of blood chemistry: Secondary | ICD-10-CM | POA: Diagnosis present

## 2020-10-29 DIAGNOSIS — E785 Hyperlipidemia, unspecified: Secondary | ICD-10-CM | POA: Diagnosis not present

## 2020-10-29 LAB — CBC WITH DIFFERENTIAL/PLATELET
Abs Immature Granulocytes: 0.05 10*3/uL (ref 0.00–0.07)
Basophils Absolute: 0 10*3/uL (ref 0.0–0.1)
Basophils Relative: 0 %
Eosinophils Absolute: 0 10*3/uL (ref 0.0–0.5)
Eosinophils Relative: 0 %
HCT: 39 % (ref 36.0–46.0)
Hemoglobin: 13.1 g/dL (ref 12.0–15.0)
Immature Granulocytes: 0 %
Lymphocytes Relative: 12 %
Lymphs Abs: 1.4 10*3/uL (ref 0.7–4.0)
MCH: 34.2 pg — ABNORMAL HIGH (ref 26.0–34.0)
MCHC: 33.6 g/dL (ref 30.0–36.0)
MCV: 101.8 fL — ABNORMAL HIGH (ref 80.0–100.0)
Monocytes Absolute: 0.9 10*3/uL (ref 0.1–1.0)
Monocytes Relative: 8 %
Neutro Abs: 8.8 10*3/uL — ABNORMAL HIGH (ref 1.7–7.7)
Neutrophils Relative %: 80 %
Platelets: 182 10*3/uL (ref 150–400)
RBC: 3.83 MIL/uL — ABNORMAL LOW (ref 3.87–5.11)
RDW: 13.2 % (ref 11.5–15.5)
WBC: 11.2 10*3/uL — ABNORMAL HIGH (ref 4.0–10.5)
nRBC: 0 % (ref 0.0–0.2)

## 2020-10-29 LAB — COMPREHENSIVE METABOLIC PANEL
ALT: 61 U/L — ABNORMAL HIGH (ref 0–44)
AST: 67 U/L — ABNORMAL HIGH (ref 15–41)
Albumin: 3.6 g/dL (ref 3.5–5.0)
Alkaline Phosphatase: 67 U/L (ref 38–126)
Anion gap: 12 (ref 5–15)
BUN: 23 mg/dL (ref 8–23)
CO2: 29 mmol/L (ref 22–32)
Calcium: 9.4 mg/dL (ref 8.9–10.3)
Chloride: 99 mmol/L (ref 98–111)
Creatinine, Ser: 1.03 mg/dL — ABNORMAL HIGH (ref 0.44–1.00)
GFR, Estimated: 59 mL/min — ABNORMAL LOW (ref >60)
Glucose, Bld: 98 mg/dL (ref 70–99)
Potassium: 4 mmol/L (ref 3.5–5.1)
Sodium: 140 mmol/L (ref 135–145)
Total Bilirubin: 1 mg/dL (ref 0.3–1.2)
Total Protein: 6.5 g/dL (ref 6.5–8.1)

## 2020-10-29 LAB — BRAIN NATRIURETIC PEPTIDE: B Natriuretic Peptide: 1282.1 pg/mL — ABNORMAL HIGH (ref 0.0–100.0)

## 2020-10-29 LAB — TROPONIN I (HIGH SENSITIVITY): Troponin I (High Sensitivity): 17 ng/L (ref <18)

## 2020-10-29 LAB — RESP PANEL BY RT-PCR (FLU A&B, COVID) ARPGX2
Influenza A by PCR: NEGATIVE
Influenza B by PCR: NEGATIVE
SARS Coronavirus 2 by RT PCR: NEGATIVE

## 2020-10-29 LAB — LACTIC ACID, PLASMA: Lactic Acid, Venous: 1.9 mmol/L (ref 0.5–1.9)

## 2020-10-29 LAB — PROCALCITONIN: Procalcitonin: <0.1 ng/mL

## 2020-10-29 MED ORDER — METHYLPREDNISOLONE SODIUM SUCC 40 MG IJ SOLR: 40.0000 mg | Freq: Two times a day (BID) | INTRAMUSCULAR | Status: DC

## 2020-10-29 MED ORDER — ACETAMINOPHEN 325 MG PO TABS: 325.0000 mg | ORAL_TABLET | Freq: Four times a day (QID) | ORAL | Status: DC | PRN

## 2020-10-29 MED ORDER — PREDNISONE 20 MG PO TABS
60.0000 mg | ORAL_TABLET | Freq: Once | ORAL | Status: AC
  Administered 2020-10-29: 60 mg via ORAL
  Filled 2020-10-29: qty 3

## 2020-10-29 MED ORDER — DOXYCYCLINE HYCLATE 100 MG PO TABS
100.0000 mg | ORAL_TABLET | Freq: Two times a day (BID) | ORAL | Status: AC
  Administered 2020-10-29 – 2020-11-02 (×10): 100 mg via ORAL
  Filled 2020-10-29 (×10): qty 1

## 2020-10-29 MED ORDER — FUROSEMIDE 10 MG/ML IJ SOLN
40.0000 mg | Freq: Two times a day (BID) | INTRAMUSCULAR | Status: DC
  Administered 2020-10-29 – 2020-10-30 (×3): 40 mg via INTRAVENOUS
  Filled 2020-10-29 (×3): qty 4

## 2020-10-29 MED ORDER — DM-GUAIFENESIN ER 30-600 MG PO TB12: 1.0000 | ORAL_TABLET | Freq: Two times a day (BID) | ORAL | Status: DC | PRN

## 2020-10-29 MED ORDER — IPRATROPIUM-ALBUTEROL 0.5-2.5 (3) MG/3ML IN SOLN
3.0000 mL | RESPIRATORY_TRACT | Status: AC
  Administered 2020-10-29 (×3): 3 mL via RESPIRATORY_TRACT
  Filled 2020-10-29: qty 9

## 2020-10-29 MED ORDER — METOPROLOL TARTRATE 25 MG PO TABS
25.0000 mg | ORAL_TABLET | Freq: Two times a day (BID) | ORAL | Status: DC
  Administered 2020-10-29 – 2020-11-01 (×7): 25 mg via ORAL
  Filled 2020-10-29 (×7): qty 1

## 2020-10-29 MED ORDER — DILTIAZEM HCL-DEXTROSE 125-5 MG/125ML-% IV SOLN (PREMIX)
5.0000 mg/h | INTRAVENOUS | Status: DC
Start: 2020-10-29 — End: 2020-10-31
  Administered 2020-10-29: 5 mg/h via INTRAVENOUS
  Administered 2020-10-30 – 2020-10-31 (×2): 12.5 mg/h via INTRAVENOUS
  Filled 2020-10-29 (×5): qty 125

## 2020-10-29 MED ORDER — METHYLPREDNISOLONE SODIUM SUCC 125 MG IJ SOLR
80.0000 mg | INTRAMUSCULAR | Status: DC
  Administered 2020-10-29 – 2020-10-30 (×2): 80 mg via INTRAVENOUS
  Filled 2020-10-29 (×2): qty 2

## 2020-10-29 MED ORDER — MOMETASONE FURO-FORMOTEROL FUM 200-5 MCG/ACT IN AERO
2.0000 | INHALATION_SPRAY | Freq: Two times a day (BID) | RESPIRATORY_TRACT | Status: DC
  Administered 2020-10-30 – 2020-11-03 (×9): 2 via RESPIRATORY_TRACT
  Filled 2020-10-29: qty 8.8

## 2020-10-29 MED ORDER — FLUOXETINE HCL 20 MG PO CAPS
40.0000 mg | ORAL_CAPSULE | Freq: Every day | ORAL | Status: DC
  Administered 2020-10-29 – 2020-11-03 (×6): 40 mg via ORAL
  Filled 2020-10-29 (×6): qty 2

## 2020-10-29 MED ORDER — APIXABAN 5 MG PO TABS
5.0000 mg | ORAL_TABLET | Freq: Two times a day (BID) | ORAL | Status: DC
  Administered 2020-10-29 – 2020-11-03 (×11): 5 mg via ORAL
  Filled 2020-10-29 (×11): qty 1

## 2020-10-29 MED ORDER — ATORVASTATIN CALCIUM 20 MG PO TABS
20.0000 mg | ORAL_TABLET | Freq: Every day | ORAL | Status: DC
  Administered 2020-10-29 – 2020-11-02 (×5): 20 mg via ORAL
  Filled 2020-10-29 (×6): qty 1

## 2020-10-29 MED ORDER — HYDRALAZINE HCL 20 MG/ML IJ SOLN: 5.0000 mg | INTRAMUSCULAR | Status: DC | PRN

## 2020-10-29 MED ORDER — FUROSEMIDE 10 MG/ML IJ SOLN
40.0000 mg | Freq: Once | INTRAMUSCULAR | Status: AC
  Administered 2020-10-29: 40 mg via INTRAVENOUS
  Filled 2020-10-29: qty 4

## 2020-10-29 MED ORDER — DILTIAZEM HCL ER COATED BEADS 180 MG PO CP24
180.0000 mg | ORAL_CAPSULE | Freq: Every day | ORAL | Status: DC
  Administered 2020-10-29 – 2020-11-03 (×6): 180 mg via ORAL
  Filled 2020-10-29 (×7): qty 1

## 2020-10-29 MED ORDER — DILTIAZEM HCL 25 MG/5ML IV SOLN
10.0000 mg | Freq: Once | INTRAVENOUS | Status: AC
  Administered 2020-10-29: 10 mg via INTRAVENOUS
  Filled 2020-10-29: qty 5

## 2020-10-29 MED ORDER — NICOTINE 21 MG/24HR TD PT24
21.0000 mg | MEDICATED_PATCH | Freq: Every day | TRANSDERMAL | Status: DC
  Administered 2020-10-30 – 2020-10-31 (×2): 21 mg via TRANSDERMAL
  Filled 2020-10-29 (×5): qty 1

## 2020-10-29 MED ORDER — ONDANSETRON HCL 4 MG/2ML IJ SOLN: 4.0000 mg | Freq: Three times a day (TID) | INTRAMUSCULAR | Status: DC | PRN

## 2020-10-29 MED ORDER — DILTIAZEM HCL 25 MG/5ML IV SOLN
15.0000 mg | Freq: Once | INTRAVENOUS | Status: AC
  Administered 2020-10-29: 15 mg via INTRAVENOUS
  Filled 2020-10-29: qty 5

## 2020-10-29 MED ORDER — ASCORBIC ACID 500 MG PO TABS
1000.0000 mg | ORAL_TABLET | Freq: Two times a day (BID) | ORAL | Status: DC
  Administered 2020-10-29 – 2020-11-03 (×10): 1000 mg via ORAL
  Filled 2020-10-29 (×10): qty 2

## 2020-10-29 MED ORDER — IPRATROPIUM BROMIDE 0.02 % IN SOLN
0.5000 mg | RESPIRATORY_TRACT | Status: DC
  Administered 2020-10-29 – 2020-10-30 (×8): 0.5 mg via RESPIRATORY_TRACT
  Filled 2020-10-29 (×7): qty 2.5

## 2020-10-29 MED ORDER — LEVALBUTEROL HCL 0.63 MG/3ML IN NEBU
0.6300 mg | INHALATION_SOLUTION | Freq: Four times a day (QID) | RESPIRATORY_TRACT | Status: DC | PRN
  Administered 2020-10-29 – 2020-11-02 (×2): 0.63 mg via RESPIRATORY_TRACT
  Filled 2020-10-29 (×2): qty 3

## 2020-10-29 MED ORDER — PANTOPRAZOLE SODIUM 40 MG PO TBEC
40.0000 mg | DELAYED_RELEASE_TABLET | Freq: Every day | ORAL | Status: DC
  Administered 2020-10-29 – 2020-11-03 (×6): 40 mg via ORAL
  Filled 2020-10-29 (×6): qty 1

## 2020-10-29 NOTE — ED Notes (Signed)
Care transferred, report received from [place], [place]

## 2020-10-29 NOTE — ED Triage Notes (Signed)
Sob , afib history, started today

## 2020-10-29 NOTE — ED Provider Notes (Signed)
[facility]  ____________________________________________   Event Date/Time   First MD Initiated Contact with Patient 10/29/20 [phone]     (approximate)  I have reviewed the triage vital signs and the nursing notes.   HISTORY  Chief Complaint Shortness of Breath    HPI Angela Singh is a 70 y.o. female with past medical history of COPD, not on home oxygen, atrial fibrillation status post cardioversion 3 days ago, CHF with an EF of 45 to 50% presents with dyspnea.  Patient was seen in this ED 3 days ago for COPD exacerbation.  She was discharged with prednisone azithromycin, she is still taking.  Her breathing worsened today.  She endorses cough that is minimally productive as well as dyspnea.  She does not feel any palpitations or chest pain.  Denies fevers or chills nausea or vomiting.  Does have some lower extremity edema.  She takes Eliquis for the atrial fibrillation is compliant.         Past Medical History:  Diagnosis Date   COPD (chronic obstructive pulmonary disease) (HCC)    Essential hypertension    GERD (gastroesophageal reflux disease)    History of echocardiogram    a. 09/2020 Echo: Ef 60-65%. No rwma. Nl RV fxn. RVSP 38.56mmHg. Mild to mod MR. Mod TR.   Mixed hyperlipidemia    Persistent atrial fibrillation (HCC)    a. Dx 09/2020. CHA2DS2VASc = 3-->Eliquis.   Tobacco abuse     Patient Active Problem List   Diagnosis Date Noted   Digoxin toxicity 10/12/2020   Elevated troponin 10/12/2020   COPD (chronic obstructive pulmonary disease) (HCC)    Chronic diastolic CHF (congestive heart failure) (HCC)    Hypokalemia    Persistent atrial fibrillation (HCC)    Weakness    Acute diastolic CHF (congestive heart failure) (HCC)    Depression    Essential hypertension 09/22/2020   Hyperlipidemia 09/22/2020   GERD (gastroesophageal reflux disease) 09/22/2020   COPD exacerbation (HCC) 09/22/2020   Tobacco abuse 09/22/2020   Atrial fibrillation  with RVR (HCC) 09/21/2020    Past Surgical History:  Procedure Laterality Date   CARDIOVERSION N/A 10/26/2020   Procedure: CARDIOVERSION;  Surgeon: Antonieta Iba, MD;  Location: [facility] ORS;  Service: Cardiovascular;  Laterality: N/A;   TEE WITHOUT CARDIOVERSION N/A 09/25/2020   Procedure: TRANSESOPHAGEAL ECHOCARDIOGRAM (TEE);  Surgeon: Iran Ouch, MD;  Location: [facility] ORS;  Service: Cardiovascular;  Laterality: N/A;   TEE WITHOUT CARDIOVERSION N/A 10/26/2020   Procedure: TRANSESOPHAGEAL ECHOCARDIOGRAM (TEE);  Surgeon: Antonieta Iba, MD;  Location: [facility] ORS;  Service: Cardiovascular;  Laterality: N/A;    Prior to Admission medications   Medication Sig Start Date End Date Taking? Authorizing Provider  albuterol (VENTOLIN HFA) 108 (90 Base) MCG/ACT inhaler Inhale 2 puffs into the lungs every 4 (four) hours as needed. Patient not taking: No sig reported 08/09/20   [provider]  apixaban (ELIQUIS) 5 MG TABS tablet Take 1 tablet (5 mg total) by mouth 2 (two) times daily. 10/25/20   End, Cristal Deer, MD  ascorbic acid (VITAMIN C) 500 MG tablet Take 1,000 mg by mouth 2 (two) times daily.    [provider]  atorvastatin (LIPITOR) 20 MG tablet Take 20 mg by mouth daily. 08/03/20   [provider]  azithromycin (ZITHROMAX) 250 MG tablet Take 1 a day for 4 days 10/27/20   Don Perking, [place], MD  diltiazem [facility] CD) 180 MG 24 hr capsule Take 1 capsule (180 mg total)  by mouth daily. 10/23/20 01/21/21  Creig Hines, NP  FLUoxetine (PROZAC) 40 MG capsule Take 40 mg by mouth daily. Patient not taking: No sig reported 08/03/20   [provider]  furosemide (LASIX) 40 MG tablet Take 1 tablet (40 mg total) by mouth daily. 10/25/20   End, Cristal Deer, MD  metoprolol tartrate (LOPRESSOR) 25 MG tablet Take 1 tablet (25 mg total) by mouth 2 (two) times daily. 10/13/20   Arnetha Courser, MD  Multiple Vitamins-Minerals (ONE-A-DAY WOMENS 50+ PO) Take 1 tablet by  mouth daily.    [provider]  pantoprazole (PROTONIX) 40 MG tablet Take 40 mg by mouth daily. 09/11/20   [provider]  potassium chloride SA (KLOR-CON) 20 MEQ tablet Take 1 tablet (20 mEq total) by mouth daily. 10/25/20   End, Cristal Deer, MD  predniSONE (DELTASONE) 50 MG tablet Take 1 tablet (50 mg total) by mouth daily for 4 days. 10/26/20 10/30/20  Delton Prairie, MD  SYMBICORT 160-4.5 MCG/ACT inhaler Inhale 1 puff into the lungs 2 (two) times daily in the am and at bedtime.. 08/08/20   [provider]  vitamin E 180 MG (400 UNITS) capsule Take 400 Units by mouth 2 (two) times daily.    [provider]    Allergies Codeine  Family History  Problem Relation Age of Onset   COPD Mother    Heart failure Father    Heart disease Sister     Social History Social History   Tobacco Use   Smoking status: Every Day    Packs/day: 1.00    Years: 48.00    Pack years: 48.00    Types: Cigarettes   Smokeless tobacco: Never  Substance Use Topics   Alcohol use: Not Currently   Drug use: Never    Review of Systems   Review of Systems  Constitutional:  Negative for chills, fatigue and fever.  Respiratory:  Positive for cough and shortness of breath. Negative for chest tightness.   Cardiovascular:  Positive for leg swelling. Negative for chest pain.  Gastrointestinal:  Negative for abdominal pain, nausea and vomiting.  All other systems reviewed and are negative.  Physical Exam Updated Vital Signs BP (!) 102/51 (BP Location: Right Arm)   Pulse (!) 110   Temp 98.5 F (36.9 C) (Oral)   Resp (!) 21   SpO2 97%   Physical Exam Vitals and nursing note reviewed.  Constitutional:      General: She is not in acute distress.    Appearance: Normal appearance.     Comments: Anxious appearing, mildly tremulous  HENT:     Head: Normocephalic and atraumatic.  Eyes:     General: No scleral icterus.    Conjunctiva/sclera: Conjunctivae normal.  Cardiovascular:      Rate and Rhythm: Tachycardia present. Rhythm irregular.  Pulmonary:     Effort: Respiratory distress present.     Breath sounds: No stridor.     Comments: Patient is able to speak in full sentences but is in mild respiratory distress, supraclavicular retractions, she has biphasic wheezing with moderate air movement Abdominal:     Palpations: Abdomen is soft. There is no mass.     Tenderness: There is no guarding.  Musculoskeletal:        General: No deformity or signs of injury.     Cervical back: Normal range of motion.     Right lower leg: Edema present.     Left lower leg: Edema present.     Comments: 1+  lower extremity edema is in the ankles  Skin:    General: Skin is dry.     Coloration: Skin is not jaundiced or pale.  Neurological:     General: No focal deficit present.     Mental Status: She is alert and oriented to person, place, and time. Mental status is at baseline.  Psychiatric:        Mood and Affect: Mood is anxious.        Behavior: Behavior normal.     LABS (all labs ordered are listed, but only abnormal results are displayed)  Labs Reviewed  COMPREHENSIVE METABOLIC PANEL - Abnormal; Notable for the following components:      Result Value   Creatinine, Ser 1.03 (*)    AST 67 (*)    ALT 61 (*)    GFR, Estimated 59 (*)    All other components within normal limits  BRAIN NATRIURETIC PEPTIDE - Abnormal; Notable for the following components:   B Natriuretic Peptide 1,282.1 (*)    All other components within normal limits  CBC WITH DIFFERENTIAL/PLATELET - Abnormal; Notable for the following components:   WBC 11.2 (*)    RBC 3.83 (*)    MCV 101.8 (*)    MCH 34.2 (*)    Neutro Abs 8.8 (*)    All other components within normal limits  RESP PANEL BY RT-PCR (FLU A&B, COVID) ARPGX2  TROPONIN I (HIGH SENSITIVITY)  TROPONIN I (HIGH SENSITIVITY)   ____________________________________________  EKG  Atrial fibrillation with RVR, left axis deviation, no acute  ischemic changes ____________________________________________  RADIOLOGY Ky Barban, personally viewed and evaluated these images (plain radiographs) as part of my medical decision making, as well as reviewing the written report by the radiologist.  ED MD interpretation: I reviewed the chest x-ray which shows pulmonary vascular congestion    ____________________________________________   PROCEDURES  Procedure(s) performed (including Critical Care):  .1-3 Lead EKG Interpretation Performed by: Georga Hacking, MD Authorized by: Georga Hacking, MD     Interpretation: abnormal     ECG rate assessment: tachycardic     Rhythm: atrial fibrillation     Ectopy: none     Conduction: normal   .Critical Care Performed by: Georga Hacking, MD Authorized by: Georga Hacking, MD   Critical care provider statement:    Critical care time (minutes):  45   Critical care was necessary to treat or prevent imminent or life-threatening deterioration of the following conditions:  Cardiac failure   Critical care was time spent personally by me on the following activities:  Evaluation of patient's response to treatment, examination of patient, ordering and performing treatments and interventions, ordering and review of laboratory studies, ordering and review of radiographic studies, pulse oximetry, re-evaluation of patient's condition, obtaining history from patient or surrogate and review of old charts   Care discussed with: admitting provider     ____________________________________________   INITIAL IMPRESSION / ASSESSMENT AND PLAN / ED COURSE     Patient is a 70 year old female with COPD who is currently being treated at home for COPD exacerbation who presents with worsening dyspnea.  She is tachycardic and in atrial fibrillation with RVR.  Does appear mildly dyspneic with supraclavicular retractions but is able to speak in full sentences.  She is wheezing throughout.   Also has some mild pitting edema in the lower extremities.  I suspect that she is having worsening COPD exacerbation given the wheezing.  Will treat with stacked duo  nebs and prednisone.  Will obtain a chest x-ray.  Given her history of CHF and lower extremity edema we will also obtain a BNP as this may be contributing to her symptoms.  He is also in A. fib with RVR after recent cardioversion, I suspect this is in the setting of acute COPD exacerbation.  Given she is going to be getting beta agonist will treat with diltiazem.  Given she has failed outpatient management, I do anticipate she will likely need admission.  Patient was given initial load of diltiazem with minimal improvement in her heart rate.  She was then given another dose 15 mg.  Heart rate still in the 130s.  We will start on a diltiazem infusion.  BNP is also significantly elevated with some pulmonary vascular congestion on chest x-ray.  I suspect that she is also having a component of CHF.  Will treat with IV Lasix.  Will admit to the hospitalist.  Clinical Course as of 10/29/20 1025  Sun Oct 29, 2020  0955 B Natriuretic Peptide(!): 1,282.1 [KM]    Clinical Course User Index [KM] Georga Hacking, MD     ____________________________________________   FINAL CLINICAL IMPRESSION(S) / ED DIAGNOSES  Final diagnoses:  None     ED Discharge Orders     None        Note:  This document was prepared using Dragon voice recognition software and may include unintentional dictation errors.    Georga Hacking, MD 10/29/20 1025

## 2020-10-29 NOTE — H&P (Signed)
History and Physical    Angela Singh:[id number] DOB: 1950/10/27 DOA: 10/29/2020  Referring MD/NP/PA:   PCP: Oswaldo Conroy, MD   Patient coming from:  The patient is coming from home.  At baseline, pt is independent for most of ADL.        Chief Complaint: SOB   HPI: Angela Singh is a 70 y.o. female with medical history significant of CHF with EF of 45-50%, hypertension, hyperlipidemia, COPD not on oxygen normally, GERD, tobacco abuse, PAF on Eliquis (s/p for cardioversion on 10/26/2020), who presents with shortness of breath.  Patient states that she she was seen in the ED on 9/23 due to shortness of breath.  She was diagnosed as COPD exacerbation.  She was discharged home on prednisone and azithromycin.  She is taking these medications, but no improvement.  She has progressively worsening shortness breath, with productive cough.  Denies chest pain, fever or chills.  Patient does not have nausea, vomiting, diarrhea or abdominal pain.  No symptoms of UTI.  She has some mild bilateral lower leg edema.  Patient was found to have atrial fibrillation with RVR, with heart rate up to 130s.  Cardizem drip started in ED.  ED Course: pt was found to have negative COVID PCR, BNP 1282, troponin level 17, mild abnormal liver function (ALP 67, AST 67, ALT 61, total bilirubin 1.0), creatinine 1.03, BUN 23, temperature normal, blood pressure 102/51, RR 25, oxygen saturation 95% on 2 L oxygen.  Chest x-ray showed vascular congestion.  Patient is admitted to progressive bed as inpatient.  Review of Systems:   General: no fevers, chills, no body weight gain, has fatigue HEENT: no blurry vision, hearing changes or sore throat Respiratory: has dyspnea, coughing, no wheezing CV: no chest pain, no palpitations GI: no nausea, vomiting, abdominal pain, diarrhea, constipation GU: no dysuria, burning on urination, increased urinary frequency, hematuria  Ext: has leg edema Neuro: no unilateral  weakness, numbness, or tingling, no vision change or hearing loss Skin: no rash, no skin tear. MSK: No muscle spasm, no deformity, no limitation of range of movement in spin Heme: No easy bruising.  Travel history: No recent long distant travel.  Allergy:  Allergies  Allergen Reactions   Codeine Other (See Comments)    weakness    Past Medical History:  Diagnosis Date   COPD (chronic obstructive pulmonary disease) (HCC)    Essential hypertension    GERD (gastroesophageal reflux disease)    History of echocardiogram    a. 09/2020 Echo: Ef 60-65%. No rwma. Nl RV fxn. RVSP 38.77mmHg. Mild to mod MR. Mod TR.   Mixed hyperlipidemia    Persistent atrial fibrillation (HCC)    a. Dx 09/2020. CHA2DS2VASc = 3-->Eliquis.   Tobacco abuse     Past Surgical History:  Procedure Laterality Date   CARDIOVERSION N/A 10/26/2020   Procedure: CARDIOVERSION;  Surgeon: Angela Iba, MD;  Location: [facility] ORS;  Service: Cardiovascular;  Laterality: N/A;   TEE WITHOUT CARDIOVERSION N/A 09/25/2020   Procedure: TRANSESOPHAGEAL ECHOCARDIOGRAM (TEE);  Surgeon: Angela Ouch, MD;  Location: [facility] ORS;  Service: Cardiovascular;  Laterality: N/A;   TEE WITHOUT CARDIOVERSION N/A 10/26/2020   Procedure: TRANSESOPHAGEAL ECHOCARDIOGRAM (TEE);  Surgeon: Angela Iba, MD;  Location: [facility] OR[facility];  Service: Cardiovascular;  Laterality: N/A;    Social History:  reports that she has been smoking cigarettes. She has a 48.00 pack-year smoking history. She has never used smokeless tobacco. She reports that she does not  currently use alcohol. She reports that she does not use drugs.  Family History:  Family History  Problem Relation Age of Onset   COPD Mother    Heart failure Father    Heart disease Sister      Prior to Admission medications   Medication Sig Start Date End Date Taking? Authorizing Provider  albuterol (VENTOLIN HFA) 108 (90 Base) MCG/ACT inhaler Inhale 2 puffs into the lungs every 4 (four) hours  as needed. Patient not taking: No sig reported 08/09/20   [provider]  apixaban (ELIQUIS) 5 MG TABS tablet Take 1 tablet (5 mg total) by mouth 2 (two) times daily. 10/25/20   End, Angela Deer, MD  ascorbic acid (VITAMIN C) 500 MG tablet Take 1,000 mg by mouth 2 (two) times daily.    [provider]  atorvastatin (LIPITOR) 20 MG tablet Take 20 mg by mouth daily. 08/03/20   [provider]  azithromycin (ZITHROMAX) 250 MG tablet Take 1 a day for 4 days 10/27/20   Don Perking, [place], MD  diltiazem (CARDIZEM CD) 180 MG 24 hr capsule Take 1 capsule (180 mg total) by mouth daily. 10/23/20 01/21/21  Creig Hines, NP  FLUoxetine (PROZAC) 40 MG capsule Take 40 mg by mouth daily. Patient not taking: No sig reported 08/03/20   [provider]  furosemide (LASIX) 40 MG tablet Take 1 tablet (40 mg total) by mouth daily. 10/25/20   End, Angela Deer, MD  metoprolol tartrate (LOPRESSOR) 25 MG tablet Take 1 tablet (25 mg total) by mouth 2 (two) times daily. 10/13/20   Arnetha Courser, MD  Multiple Vitamins-Minerals (ONE-A-DAY WOMENS 50+ PO) Take 1 tablet by mouth daily.    [provider]  pantoprazole (PROTONIX) 40 MG tablet Take 40 mg by mouth daily. 09/11/20   [provider]  potassium chloride SA (KLOR-CON) 20 MEQ tablet Take 1 tablet (20 mEq total) by mouth daily. 10/25/20   End, Angela Deer, MD  predniSONE (DELTASONE) 50 MG tablet Take 1 tablet (50 mg total) by mouth daily for 4 days. 10/26/20 10/30/20  Angela Prairie, MD  SYMBICORT 160-4.5 MCG/ACT inhaler Inhale 1 puff into the lungs 2 (two) times daily in the am and at bedtime.. 08/08/20   [provider]  vitamin E 180 MG (400 UNITS) capsule Take 400 Units by mouth 2 (two) times daily.    [provider]    Physical Exam: Vitals:   10/29/20 0833 10/29/20 0944 10/29/20 1012  BP: 125/72 (!) 102/51   Pulse: (!) 130 (!) 110   Resp: (!) 25 (!) 21   Temp: 98.5 F (36.9 C) 98.5 F (36.9  C)   TempSrc: Oral Oral   SpO2: 99% 95% 97%   General: Not in acute distress HEENT:       Eyes: PERRL, EOMI, no scleral icterus.       ENT: No discharge from the ears and nose, no pharynx injection, no tonsillar enlargement.        Neck: positive JVD, no bruit, no mass felt. Heme: No neck lymph node enlargement. Cardiac: S1/S2, RRR, No murmurs, No gallops or rubs. Respiratory: Has rhonchi and crackles bilaterally GI: Soft, nondistended, nontender, no rebound pain, no organomegaly, BS present. GU: No hematuria Ext: has trace leg edema bilaterally. 1+DP/PT pulse bilaterally. Musculoskeletal: No joint deformities, No joint redness or warmth, no limitation of ROM in spin. Skin: No rashes.  Neuro: Alert, oriented X3, cranial nerves II-XII grossly intact, moves all extremities normally. Psych: Patient is not psychotic, no  suicidal or hemocidal ideation.  Labs on Admission: I have personally reviewed following labs and imaging studies  CBC: Recent Labs  Lab 10/23/20 1550 10/26/20 2220 10/29/20 0836  WBC 4.0 5.6 11.2*  NEUTROABS  --  3.7 8.8*  HGB 13.7 13.2 13.1  HCT 40.6 38.9 39.0  MCV 98* 99.7 101.8*  PLT 223 201 182   Basic Metabolic Panel: Recent Labs  Lab 10/23/20 1550 10/26/20 2220 10/29/20 0836  NA 144 137 140  K 3.2* 3.6 4.0  CL 101 99 99  CO2 29 28 29   GLUCOSE 81 145* 98  BUN 12 20 23   CREATININE 1.02* 1.03* 1.03*  CALCIUM 8.5* 9.6 9.4   GFR: Estimated Creatinine Clearance: 41.2 mL/min (A) (by C-G formula based on SCr of 1.03 mg/dL (H)). Liver Function Tests: Recent Labs  Lab 10/26/20 2220 10/29/20 0836  AST 85* 67*  ALT 62* 61*  ALKPHOS 64 67  BILITOT 1.0 1.0  PROT 6.3* 6.5  ALBUMIN 3.6 3.6   No results for input(s): LIPASE, AMYLASE in the last 168 hours. No results for input(s): AMMONIA in the last 168 hours. Coagulation Profile: No results for input(s): INR, PROTIME in the last 168 hours. Cardiac Enzymes: No results for input(s): CKTOTAL,  CKMB, CKMBINDEX, TROPONINI in the last 168 hours. BNP (last 3 results) No results for input(s): PROBNP in the last 8760 hours. HbA1C: No results for input(s): HGBA1C in the last 72 hours. CBG: No results for input(s): GLUCAP in the last 168 hours. Lipid Profile: No results for input(s): CHOL, HDL, LDLCALC, TRIG, CHOLHDL, LDLDIRECT in the last 72 hours. Thyroid Function Tests: No results for input(s): TSH, T4TOTAL, FREET4, T3FREE, THYROIDAB in the last 72 hours. Anemia Panel: No results for input(s): VITAMINB12, FOLATE, FERRITIN, TIBC, IRON, RETICCTPCT in the last 72 hours. Urine analysis: No results found for: COLORURINE, APPEARANCEUR, LABSPEC, PHURINE, GLUCOSEU, HGBUR, BILIRUBINUR, KETONESUR, PROTEINUR, UROBILINOGEN, NITRITE, LEUKOCYTESUR Sepsis Labs: @LABRCNTIP (procalcitonin:4,lacticidven:4) ) Recent Results (from the past 240 hour(s))  SARS CORONAVIRUS 2 (TAT 6-24 HRS) Nasopharyngeal Nasopharyngeal Swab     Status: None   Collection Time: 10/25/20  9:22 AM   Specimen: Nasopharyngeal Swab  Result Value Ref Range Status   SARS Coronavirus 2 NEGATIVE NEGATIVE Final    Comment: (NOTE) SARS-CoV-2 target nucleic acids are NOT DETECTED.  The SARS-CoV-2 RNA is generally detectable in upper and lower respiratory specimens during the acute phase of infection. Negative results do not preclude SARS-CoV-2 infection, do not rule out co-infections with other pathogens, and should not be used as the sole basis for treatment or other patient management decisions. Negative results must be combined with clinical observations, patient history, and epidemiological information. The expected result is Negative.  Fact Sheet for Patients: 10/31/20  Fact Sheet for Healthcare Providers:  This test is not yet approved or cleared by the 10/27/20 FDA and  has been authorized for detection and/or diagnosis of SARS-CoV-2  by FDA under an Emergency Use Authorization (EUA). This EUA will remain  in effect (meaning this test can be used) for the duration of the COVID-19 declaration under Se ction 564(b)(1) of the Act, 21 U.S.C. section 360bbb-3(b)(1), unless the authorization is terminated or revoked sooner.  Performed at [facility], [address] [place], [facility] [address]   Resp Panel by RT-PCR (Flu A&B, Covid) Nasopharyngeal Swab     Status: None   Collection Time: 10/29/20  8:54 AM   Specimen: Nasopharyngeal Swab; Nasopharyngeal(NP) swabs in vial transport medium  Result Value  Ref Range Status   SARS Coronavirus 2 by RT PCR NEGATIVE NEGATIVE Final    Comment: (NOTE) SARS-CoV-2 target nucleic acids are NOT DETECTED.  The SARS-CoV-2 RNA is generally detectable in upper respiratory specimens during the acute phase of infection. The lowest concentration of SARS-CoV-2 viral copies this assay can detect is 138 copies/mL. A negative result does not preclude SARS-Cov-2 infection and should not be used as the sole basis for treatment or other patient management decisions. A negative result may occur with  improper specimen collection/handling, submission of specimen other than nasopharyngeal swab, presence of viral mutation(s) within the areas targeted by this assay, and inadequate number of viral copies(<138 copies/mL). A negative result must be combined with clinical observations, patient history, and epidemiological information. The expected result is Negative.  Fact Sheet for Patients:  BloggerCourse.com  Fact Sheet for Healthcare Providers:  SeriousBroker.it  This test is no t yet approved or cleared by the [place] FDA and  has been authorized for detection and/or diagnosis of SARS-CoV-2 by FDA under an Emergency Use Authorization (EUA). This EUA will remain  in effect (meaning this test can be used) for the duration of the COVID-19  declaration under Section 564(b)(1) of the Act, 21 U.S.C.section 360bbb-3(b)(1), unless the authorization is terminated  or revoked sooner.       Influenza A by PCR NEGATIVE NEGATIVE Final   Influenza B by PCR NEGATIVE NEGATIVE Final    Comment: (NOTE) The Xpert Xpress SARS-CoV-2/FLU/RSV plus assay is intended as an aid in the diagnosis of influenza from Nasopharyngeal swab specimens and should not be used as a sole basis for treatment. Nasal washings and aspirates are unacceptable for Xpert Xpress SARS-CoV-2/FLU/RSV testing.  Fact Sheet for Patients: BloggerCourse.com  Fact Sheet for Healthcare Providers: SeriousBroker.it  This test is not yet approved or cleared by the [place] FDA and has been authorized for detection and/or diagnosis of SARS-CoV-2 by FDA under an Emergency Use Authorization (EUA). This EUA will remain in effect (meaning this test can be used) for the duration of the COVID-19 declaration under Section 564(b)(1) of the Act, 21 U.S.C. section 360bbb-3(b)(1), unless the authorization is terminated or revoked.  Performed at [facility], [address] [place], [place] [address]      Radiological Exams on Admission: DG Chest Portable 1 View  Result Date: 10/29/2020 CLINICAL DATA:  Shortness of breath. History of atrial fibrillation. EXAM: PORTABLE CHEST 1 VIEW COMPARISON:  10/26/2020 FINDINGS: Heart size and mediastinal contours are unremarkable. Aortic atherosclerosis. No pleural effusion identified. Diffuse increased scratch set increased pulmonary vascularity is noted. No superimposed airspace consolidation. IMPRESSION: Pulmonary vascular congestion. Electronically Signed   By: Signa Kell M.D.   On: 10/29/2020 09:23     EKG: I have personally reviewed.  Atrial fibrillation, QTC 421, low voltage, poor R wave progression, mild T wave inversion in V5-V6  Assessment/Plan Principal Problem:    Acute on chronic systolic CHF (congestive heart failure) (HCC) Active Problems:   Atrial fibrillation with RVR (HCC)   Essential hypertension   Hyperlipidemia   COPD exacerbation (HCC)   Tobacco abuse   Sepsis (HCC)   Abnormal LFTs   Acute on chronic systolic CHF (congestive heart failure) (HCC): 2D echo 10/26/2020 showed EF of 45-50%.  Patient has bilateral lower leg edema, positive JVD, crackles on auscultation, elevated BNP 1282, vascular congestion on chest x-ray, clinically consistent with  CHF exacerbation.  -Will admit to progressive unit as inpatient -Lasix 40 mg bid by IV -  Daily weights -strict I/O's -Low salt diet -Fluid restriction -Obtain REDs Vest reading  COPD exacerbation: Patient has rhonchi on auscultation with productive cough and shortness breath, clinically consistent with COPD exacerbation -Bronchodilators -Solu-Medrol 40 mg IV bid -Doxycycline 100 mg twice daily -Mucinex for cough  -sputum culture -Nasal cannula oxygen as needed to maintain O2 saturation 93% or greater  Sepsis due to COPD exacerbation: Patient meets criteria for sepsis with tachycardia with heart rate of 130 and tachypnea with RR 25.  Pending lactic acid level -Will not give IV fluid due to CHF exacerbation -will get Procalcitonin and trend lactic acid levels per sepsis protocol.  Atrial fibrillation with RVR (HCC): HR is up to 130s. -continue Eliquis -Continue home metoprolol and oral Cardizem -Cardizem drip  Essential hypertension -IV hydralazine prn -Metoprolol and Cardizem -Patient is on IV Lasix  Hyperlipidemia -Lipitor  Tobacco abuse: -Did counseling about importance of quitting smoking -Nicotine patch  Mild Abnormal LFTs: Likely due to liver congestion secondary to CHF exacerbation -Judicious use low-dose Tylenol.      DVT ppx: on eliquis Code Status: Full code Family Communication:  Yes, patient's son by phone Disposition Plan:  Anticipate discharge back to  previous environment Consults called:  none Admission status and Level of care: Progressive Cardiac:    as inpt      Status is: Inpatient  Remains inpatient appropriate because:Inpatient level of care appropriate due to severity of illness  Dispo: The patient is from: Home              Anticipated d/c is to: Home              Patient currently is not medically stable to d/c.   Difficult to place patient No           Date of Service 10/29/2020    Lorretta Harp [facility]   If 7PM-7AM, please contact night-coverage [email or web address] 10/29/2020, 11:24 AM

## 2020-10-29 NOTE — ED Notes (Signed)
Ekg completed , pt placed on full cardiac monitor , call light in reach

## 2020-10-30 ENCOUNTER — Other Ambulatory Visit: Payer: Self-pay

## 2020-10-30 ENCOUNTER — Encounter: Payer: Self-pay | Admitting: Internal Medicine

## 2020-10-30 DIAGNOSIS — I5023 Acute on chronic systolic (congestive) heart failure: Secondary | ICD-10-CM | POA: Diagnosis not present

## 2020-10-30 DIAGNOSIS — I10 Essential (primary) hypertension: Secondary | ICD-10-CM

## 2020-10-30 DIAGNOSIS — J441 Chronic obstructive pulmonary disease with (acute) exacerbation: Secondary | ICD-10-CM | POA: Diagnosis not present

## 2020-10-30 DIAGNOSIS — I4891 Unspecified atrial fibrillation: Secondary | ICD-10-CM | POA: Diagnosis not present

## 2020-10-30 DIAGNOSIS — E785 Hyperlipidemia, unspecified: Secondary | ICD-10-CM

## 2020-10-30 LAB — LACTIC ACID, PLASMA: Lactic Acid, Venous: 1.3 mmol/L (ref 0.5–1.9)

## 2020-10-30 MED ORDER — AMIODARONE HCL 200 MG PO TABS
400.0000 mg | ORAL_TABLET | Freq: Two times a day (BID) | ORAL | Status: DC
  Administered 2020-10-30 (×2): 400 mg via ORAL
  Filled 2020-10-30 (×3): qty 2

## 2020-10-30 MED ORDER — IPRATROPIUM BROMIDE 0.02 % IN SOLN
0.5000 mg | Freq: Four times a day (QID) | RESPIRATORY_TRACT | Status: DC
  Administered 2020-10-31 – 2020-11-01 (×7): 0.5 mg via RESPIRATORY_TRACT
  Filled 2020-10-30 (×7): qty 2.5

## 2020-10-30 NOTE — Telephone Encounter (Signed)
Patient currently admitted 10/30/20. APP made aware

## 2020-10-30 NOTE — Progress Notes (Signed)
PROGRESS NOTE    Angela Singh   YKD:[id number]  DOB: 07-12-1950  PCP: Oswaldo Conroy, MD    DOA: 10/29/2020 LOS: 1    Brief Narrative / Hospital Course to Date:   70 year old female with past medical history of CHF related to difficult to control paroxysmal A. fib with RVR (status post cardioversion on 10/26/2020), hypertension, hyperlipidemia, COPD not on home oxygen, GERD, tobacco abuse who presented to the ED on 10/29/2020 with persistent shortness of breath.  She had been seen in the ED 2 days prior diagnosed with COPD exacerbation, discharged with prednisone and Zithromax.  Her symptoms failed to improve and she was having recurrent heart palpitations.  Return to the ER where she was found to have A. fib with RVR with heart rate into the 130s with associated decompensated heart failure.  Started on Cardizem drip and IV diuresis.  Admitted to [facility] service with cardiology consulted.  Assessment & Plan   Principal Problem:   Acute on chronic systolic CHF (congestive heart failure) (HCC) Active Problems:   Atrial fibrillation with RVR (HCC)   Essential hypertension   Hyperlipidemia   COPD exacerbation (HCC)   Tobacco abuse   Sepsis (HCC)   Abnormal LFTs   Acute on chronic systolic CHF -EF 45 to 50% on TEE 10/26/2020.  Presented with pulmonary vascular congestion on chest x-ray and some lower extremity edema, elevated BNP in the setting of A. fib RVR -consistent with decompensation requiring IV diuresis. --Cardiology managing --Continue IV diuresis --Continue metoprolol --Monitor renal function and electrolytes --Strict I/O's and daily weights  Persistent A. fib with RVR -patient has history of difficult to control A. fib.  Recent cardioversion on 10/26/2020 was successful but now with recurrent A. fib. --Continue Eliquis --Started on amiodarone loading --Continue Cardizem drip for rate control --As needed Lopressor if needed for additional rate control --Prior toxicity  with digoxin, attempt to avoid  COPD with acute exacerbation -POA wheezing on exam and shortness of breath. --Continue IV steroids doxycycline and bronchodilators --Supplement oxygen if needed to maintain sats above 90% --Follow sputum culture  Sepsis ruled out -patient does not have any evidence of infection.  Tachycardia and tachypnea secondary to A. fib with RVR and CHF decompensation.  Procalcitonin and lactic acid both negative  Hypertension -BP controlled.  On metoprolol and Cardizem in addition to IV Lasix as above  Hyperlipidemia -continue atorvastatin  Tobacco abuse -patient counseled on importance of cessation.  Nicotine patch ordered  Mild transaminitis -present on admission, likely due to liver congestion in the setting of CHF decompensation.  Follow LFTs.  Patient BMI: There is no height or weight on file to calculate BMI.   DVT prophylaxis:  apixaban (ELIQUIS) tablet 5 mg   Diet:  Diet Orders (From admission, onward)     Start     Ordered   10/29/20 1044  Diet Heart Room service appropriate? Yes; Fluid consistency: Thin  Diet effective now       Question Answer Comment  Room service appropriate? Yes   Fluid consistency: Thin   Na restriction, if any: 2 gm Na      10/29/20 1043              Code Status: Prior   Subjective 10/30/20    Patient seen in the ED while holding for a bed this morning.  She reports high urine output with IV Lasix.  Requesting regular diet stating she does not add any extra sodium.  She reports that  oral diuretics at home do not seem to work as well.  She denies chest pain, shortness of breath at rest, palpitations or other acute complaints at this time.   Disposition Plan & Communication   Status is: Inpatient  Remains inpatient appropriate because:IV treatments appropriate due to intensity of illness or inability to take PO  Dispo: The patient is from: Home              Anticipated d/c is to: Home              Patient  currently is not medically stable to d/c.   Difficult to place patient No    Consults, Procedures, Significant Events   Consultants:  Cardiology  Procedures:  None  Antimicrobials:  Anti-infectives (From admission, onward)    Start     Dose/Rate Route Frequency Ordered Stop   10/29/20 1130  doxycycline (VIBRA-TABS) tablet 100 mg        100 mg Oral Every 12 hours 10/29/20 1047           Micro    Objective   Vitals:   10/30/20 0300 10/30/20 0400 10/30/20 0500 10/30/20 0600  BP: 100/71 110/75 110/74 114/73  Pulse: (!) 54 64 82 (!) 153  Resp: 14 15 (!) 21 18  Temp:      TempSrc:      SpO2: 93% 93% 92% 90%    Intake/Output Summary (Last 24 hours) at 10/30/2020 0753 Last data filed at 10/30/2020 0448 Gross per 24 hour  Intake --  Output 500 ml  Net -500 ml   There were no vitals filed for this visit.  Physical Exam:  General exam: awake, alert, no acute distress HEENT: atraumatic, clear conjunctiva, anicteric sclera, moist mucus membranes, hearing grossly normal  Respiratory system: Faint basilar crackles and occasional expiratory wheeze Cardiovascular system: Irregular rhythm, regular rate, trace lower extremity edema.   Gastrointestinal system: soft and nontender abdomen. Central nervous system: A&O x4. no gross focal neurologic deficits, normal speech Extremities: moves all, no edema, normal tone Psychiatry: normal mood, congruent affect, judgement and insight appear normal  Labs   Data Reviewed: I have personally reviewed following labs and imaging studies  CBC: Recent Labs  Lab 10/23/20 1550 10/26/20 2220 10/29/20 0836  WBC 4.0 5.6 11.2*  NEUTROABS  --  3.7 8.8*  HGB 13.7 13.2 13.1  HCT 40.6 38.9 39.0  MCV 98* 99.7 101.8*  PLT 223 201 182   Basic Metabolic Panel: Recent Labs  Lab 10/23/20 1550 10/26/20 2220 10/29/20 0836  NA 144 137 140  K 3.2* 3.6 4.0  CL 101 99 99  CO2 29 28 29   GLUCOSE 81 145* 98  BUN 12 20 23   CREATININE 1.02*  1.03* 1.03*  CALCIUM 8.5* 9.6 9.4   GFR: Estimated Creatinine Clearance: 41.2 mL/min (A) (by C-G formula based on SCr of 1.03 mg/dL (H)). Liver Function Tests: Recent Labs  Lab 10/26/20 2220 10/29/20 0836  AST 85* 67*  ALT 62* 61*  ALKPHOS 64 67  BILITOT 1.0 1.0  PROT 6.3* 6.5  ALBUMIN 3.6 3.6   No results for input(s): LIPASE, AMYLASE in the last 168 hours. No results for input(s): AMMONIA in the last 168 hours. Coagulation Profile: No results for input(s): INR, PROTIME in the last 168 hours. Cardiac Enzymes: No results for input(s): CKTOTAL, CKMB, CKMBINDEX, TROPONINI in the last 168 hours. BNP (last 3 results) No results for input(s): PROBNP in the last 8760 hours. HbA1C: No results for input(s):  HGBA1C in the last 72 hours. CBG: No results for input(s): GLUCAP in the last 168 hours. Lipid Profile: No results for input(s): CHOL, HDL, LDLCALC, TRIG, CHOLHDL, LDLDIRECT in the last 72 hours. Thyroid Function Tests: No results for input(s): TSH, T4TOTAL, FREET4, T3FREE, THYROIDAB in the last 72 hours. Anemia Panel: No results for input(s): VITAMINB12, FOLATE, FERRITIN, TIBC, IRON, RETICCTPCT in the last 72 hours. Sepsis Labs: Recent Labs  Lab 10/29/20 1117  PROCALCITON <0.10  LATICACIDVEN 1.9    Recent Results (from the past 240 hour(s))  SARS CORONAVIRUS 2 (TAT 6-24 HRS) Nasopharyngeal Nasopharyngeal Swab     Status: None   Collection Time: 10/25/20  9:22 AM   Specimen: Nasopharyngeal Swab  Result Value Ref Range Status   SARS Coronavirus 2 NEGATIVE NEGATIVE Final    Comment: (NOTE) SARS-CoV-2 target nucleic acids are NOT DETECTED.  The SARS-CoV-2 RNA is generally detectable in upper and lower respiratory specimens during the acute phase of infection. Negative results do not preclude SARS-CoV-2 infection, do not rule out co-infections with other pathogens, and should not be used as the sole basis for treatment or other patient management decisions. Negative  results must be combined with clinical observations, patient history, and epidemiological information. The expected result is Negative.  Fact Sheet for Patients: HairSlick.no  Fact Sheet for Healthcare Providers: quierodirigir.com  This test is not yet approved or cleared by the [place] FDA and  has been authorized for detection and/or diagnosis of SARS-CoV-2 by FDA under an Emergency Use Authorization (EUA). This EUA will remain  in effect (meaning this test can be used) for the duration of the COVID-19 declaration under Se ction 564(b)(1) of the Act, 21 U.S.C. section 360bbb-3(b)(1), unless the authorization is terminated or revoked sooner.  Performed at [facility], [address], [place], [place] [address]   Resp Panel by RT-PCR (Flu A&B, Covid) Nasopharyngeal Swab     Status: None   Collection Time: 10/29/20  8:54 AM   Specimen: Nasopharyngeal Swab; Nasopharyngeal(NP) swabs in vial transport medium  Result Value Ref Range Status   SARS Coronavirus 2 by RT PCR NEGATIVE NEGATIVE Final    Comment: (NOTE) SARS-CoV-2 target nucleic acids are NOT DETECTED.  The SARS-CoV-2 RNA is generally detectable in upper respiratory specimens during the acute phase of infection. The lowest concentration of SARS-CoV-2 viral copies this assay can detect is 138 copies/mL. A negative result does not preclude SARS-Cov-2 infection and should not be used as the sole basis for treatment or other patient management decisions. A negative result may occur with  improper specimen collection/handling, submission of specimen other than nasopharyngeal swab, presence of viral mutation(s) within the areas targeted by this assay, and inadequate number of viral copies(<138 copies/mL). A negative result must be combined with clinical observations, patient history, and epidemiological information. The expected result is Negative.  Fact Sheet  for Patients:  BloggerCourse.com  Fact Sheet for Healthcare Providers:  SeriousBroker.it  This test is no t yet approved or cleared by the [place] FDA and  has been authorized for detection and/or diagnosis of SARS-CoV-2 by FDA under an Emergency Use Authorization (EUA). This EUA will remain  in effect (meaning this test can be used) for the duration of the COVID-19 declaration under Section 564(b)(1) of the Act, 21 U.S.C.section 360bbb-3(b)(1), unless the authorization is terminated  or revoked sooner.       Influenza A by PCR NEGATIVE NEGATIVE Final   Influenza B by PCR NEGATIVE NEGATIVE Final  Comment: (NOTE) The Xpert Xpress SARS-CoV-2/FLU/RSV plus assay is intended as an aid in the diagnosis of influenza from Nasopharyngeal swab specimens and should not be used as a sole basis for treatment. Nasal washings and aspirates are unacceptable for Xpert Xpress SARS-CoV-2/FLU/RSV testing.  Fact Sheet for Patients: BloggerCourse.com  Fact Sheet for Healthcare Providers: SeriousBroker.it  This test is not yet approved or cleared by the [place] FDA and has been authorized for detection and/or diagnosis of SARS-CoV-2 by FDA under an Emergency Use Authorization (EUA). This EUA will remain in effect (meaning this test can be used) for the duration of the COVID-19 declaration under Section 564(b)(1) of the Act, 21 U.S.C. section 360bbb-3(b)(1), unless the authorization is terminated or revoked.  Performed at [facility], [address] [place], [place] [address]       Imaging Studies   DG Chest Portable 1 View  Result Date: 10/29/2020 CLINICAL DATA:  Shortness of breath. History of atrial fibrillation. EXAM: PORTABLE CHEST 1 VIEW COMPARISON:  10/26/2020 FINDINGS: Heart size and mediastinal contours are unremarkable. Aortic atherosclerosis. No pleural  effusion identified. Diffuse increased scratch set increased pulmonary vascularity is noted. No superimposed airspace consolidation. IMPRESSION: Pulmonary vascular congestion. Electronically Signed   By: Signa Kell M.D.   On: 10/29/2020 09:23     Medications   Scheduled Meds:  apixaban  5 mg Oral BID   ascorbic acid  1,000 mg Oral BID   atorvastatin  20 mg Oral QHS   diltiazem  180 mg Oral Daily   doxycycline  100 mg Oral Q12H   FLUoxetine  40 mg Oral Daily   furosemide  40 mg Intravenous Q12H   ipratropium  0.5 mg Nebulization Q4H   methylPREDNISolone (SOLU-MEDROL) injection  80 mg Intravenous Q24H   metoprolol tartrate  25 mg Oral BID   mometasone-formoterol  2 puff Inhalation BID   nicotine  21 mg Transdermal Daily   pantoprazole  40 mg Oral Daily   Continuous Infusions:  diltiazem (CARDIZEM) infusion 12.5 mg/hr (10/30/20 0647)       LOS: 1 day    Time spent: 30 minutes    Pennie Banter, DO [facility]  10/30/2020, 7:53 AM      If 7PM-7AM, please contact night-coverage. How to contact the [facility] Attending or Consulting provider 7A - 7P or covering provider during after hours 7P -7A, for this patient?    Check the care team in [facility] and look for a) attending/consulting [facility] provider listed and b) the [facility] team listed Log into [email or web address] and use [facility] universal password to access. If you do not have the password, please contact the hospital operator. Locate the [facility] provider you are looking for under [facility] and page to a number that you can be directly reached. If you still have difficulty reaching the provider, please page the [facility] (Director on Call) for the Hospitalists listed on amion for assistance.

## 2020-10-30 NOTE — Plan of Care (Signed)

## 2020-10-30 NOTE — ED Notes (Signed)
Clean linens placed on bed

## 2020-10-30 NOTE — Consult Note (Signed)
Cardiology Consultation:   Patient ID: ADDYSON TRAUB; [id number]; [birth year]   Admit date: 10/29/2020 Date of Consult: 10/30/2020  Primary Care Provider: Oswaldo Conroy, MD Primary Cardiologist: Mariah Milling Primary Electrophysiologist:  None   Patient Profile:   Angela Singh is a 70 y.o. female with a hx of HFpEF, persistent A. fib status post DCCV, HTN, HLD, COPD, tobacco use, and GERD who is being seen today for the evaluation of recurrent A. fib with RVR and HFpEF at the request of Dr. Denton Lank.  History of Present Illness:   Angela Singh was admitted to the hospital in 09/2020 with progressive dyspnea, productive cough, and congestion.  She was diagnosed with new onset A. fib with RVR and acute HFpEF.  High-sensitivity troponin negative x2.  2D surface echo on 09/22/2020 showed an EF of 60 to 65%, no regional wall motion abnormalities, indeterminate LV diastolic function parameters, normal RV systolic function and ventricular cavity size, mildly elevated PASP estimated at 38.2 mmHg, mild to moderate mitral regurgitation, moderate tricuspid regurgitation, mildly dilated left atrium measuring 38 mm.  Rhythm was A. fib with RVR with a ventricular rate in the 120s bpm.  With difficult to control ventricular rates, she was placed on diltiazem, bisoprolol, digoxin, and IV amiodarone with plans for TEE guided DCCV.  She underwent TEE on 8/22 which demonstrated a left atrial appendage thrombus leading to the cancellation of DCCV and discontinuation of amiodarone with recommendation to pursue rate control until she was adequately anticoagulated followed by repeat TEE guided DCCV as an outpatient.  She was discharged on Eliquis, digoxin 0.25 mg daily, Cardizem CD 180 mg daily, Toprol-XL 50 mg twice daily, and furosemide 40 mg daily.  Discharge digoxin level 0.7.   Outpatient labs obtained on 9/7 showed a critical digoxin level of 2.7, BUN 16, serum creatinine 0.77, and potassium 3.4.  In this  setting, she was admitted to the hospital for monitoring.  She was relatively hypotensive.  With holding of digoxin her levels improved and she did not require Digibind.  During that admission and she remained in A. fib, initially with slow ventricular response, though as digoxin level decreased her ventricular rates increased.  She was discharged on metoprolol.  She was seen in the office last on 10/23/2020 and continued to note some fatigue.  She continued to smoke.  She remained in A. fib with RVR with ventricular rates in the 130s bpm.  Cardizem CD 180 mg was resumed, and she was continued on metoprolol.  She subsequently underwent repeat TEE on 10/26/2020 which showed an EF of 45 to 50%, no regional wall motion abnormalities, normal RV systolic function and ventricular cavity size, normal PASP, mild to moderate mitral regurgitation, mild to moderate tricuspid regurgitation, mild aortic valve sclerosis without evidence of stenosis, and estimated right atrial pressure of 3 mmHg, agitated saline contrast bubble study was positive with a small PFO with predominantly right to left shunting noted across the atrial septum, moderately dilated left atrium, no evidence of left atrial or left atrial appendage thrombus was detected.  She then underwent successful DCCV.  She was seen in the ED on 9/22 with shortness of breath and treated for COPD exacerbation.  EKG showed she was maintaining sinus rhythm with a bradycardic rate.  Initial high-sensitivity troponin 19 with a delta of 16.  BNP 522.  Chest x-ray showed mild pulmonary vascular congestion and possible developing interstitial edema in the lung bases as well as a possible small right pleural  effusion.  She was discharged with azithromycin and prednisone.  She was admitted to [facility] on 9/25 with a several day history of increased SOB, cough, and ankle edema requiring supplemental oxygen via nasal cannula at 2 L.  She reports "the Lasix" was not helping. No chest  pain, palpitations, dizziness, presyncope, or syncope. No following a low sodium diet at home. She has been compliant with OAC and denies missing any doses.  She was noted to be back in A. fib with RVR with ventricular rates in the 130s bpm as outlined below.  High-sensitivity troponin negative.  BNP 1282.  WBC 11.2, Hgb 13.1, PLT 182.  Potassium 4.0, BUN 23, serum creatinine 1.03, albumin 3.6, AST 67, ALT 61 (which are improved from prior).  Chest x-ray showed pulmonary vascular congestion.  In the ED, she has been continued on Eliquis, received a total of 25 mg of IV diltiazem, been placed on a Cardizem drip, has received 40 mg of IV Lasix in the ED and was placed on 40 mg IV Lasix twice daily upon admission, metoprolol, IV steroids, and nebulizers.  Currently, notes her dyspnea is much improved and is back to baseline. She is on room air. No chest pain or palpitations. She remains in Afib with ventricular rates in the 90s to low 100s bpm. Lower extremity swelling is resolved.  Documented urine output 500 mL.  Past Medical History:  Diagnosis Date   COPD (chronic obstructive pulmonary disease) (HCC)    Essential hypertension    GERD (gastroesophageal reflux disease)    History of echocardiogram    a. 09/2020 Echo: Ef 60-65%. No rwma. Nl RV fxn. RVSP 38.52mmHg. Mild to mod MR. Mod TR.   Mixed hyperlipidemia    Persistent atrial fibrillation (HCC)    a. Dx 09/2020. CHA2DS2VASc = 3-->Eliquis.   Tobacco abuse     Past Surgical History:  Procedure Laterality Date   CARDIOVERSION N/A 10/26/2020   Procedure: CARDIOVERSION;  Surgeon: Antonieta Iba, MD;  Location: [facility] ORS;  Service: Cardiovascular;  Laterality: N/A;   TEE WITHOUT CARDIOVERSION N/A 09/25/2020   Procedure: TRANSESOPHAGEAL ECHOCARDIOGRAM (TEE);  Surgeon: Iran Ouch, MD;  Location: [facility] ORS;  Service: Cardiovascular;  Laterality: N/A;   TEE WITHOUT CARDIOVERSION N/A 10/26/2020   Procedure: TRANSESOPHAGEAL ECHOCARDIOGRAM (TEE);   Surgeon: Antonieta Iba, MD;  Location: [facility] ORS;  Service: Cardiovascular;  Laterality: N/A;     Home Meds: Prior to Admission medications   Medication Sig Start Date End Date Taking? Authorizing Provider  albuterol (VENTOLIN HFA) 108 (90 Base) MCG/ACT inhaler Inhale 2 puffs into the lungs every 4 (four) hours as needed.   Yes [provider]  apixaban (ELIQUIS) 5 MG TABS tablet Take 1 tablet (5 mg total) by mouth 2 (two) times daily. 10/25/20  Yes End, Cristal Deer, MD  ascorbic acid (VITAMIN C) 500 MG tablet Take 1,000 mg by mouth 2 (two) times daily.   Yes [provider]  atorvastatin (LIPITOR) 20 MG tablet Take 20 mg by mouth at bedtime.   Yes [provider]  azithromycin (ZITHROMAX) 250 MG tablet Take 1 a day for 4 days 10/27/20  Yes Don Perking, [place], MD  budesonide-formoterol [facility]) 160-4.5 MCG/ACT inhaler Inhale 1 puff into the lungs 2 (two) times daily in the am and at bedtime.. 08/08/20  Yes [provider]  diltiazem (CARDIZEM CD) 180 MG 24 hr capsule Take 1 capsule (180 mg total) by mouth daily. 10/23/20 01/21/21 Yes Creig Hines, NP  FLUoxetine (PROZAC) 40  MG capsule Take 40 mg by mouth daily.   Yes [provider]  furosemide (LASIX) 40 MG tablet Take 1 tablet (40 mg total) by mouth daily. 10/25/20  Yes End, Cristal Deer, MD  metoprolol tartrate (LOPRESSOR) 25 MG tablet Take 1 tablet (25 mg total) by mouth 2 (two) times daily. 10/13/20  Yes Arnetha Courser, MD  Multiple Vitamins-Minerals (ONE-A-DAY WOMENS 50+ PO) Take 1 tablet by mouth daily.   Yes [provider]  pantoprazole (PROTONIX) 40 MG tablet Take 40 mg by mouth daily. 09/11/20  Yes [provider]  potassium chloride SA (KLOR-CON) 20 MEQ tablet Take 1 tablet (20 mEq total) by mouth daily. 10/25/20  Yes End, Cristal Deer, MD  predniSONE (DELTASONE) 50 MG tablet Take 1 tablet (50 mg total) by mouth daily for 4 days. 10/26/20 10/30/20 Yes Delton Prairie, MD   vitamin E 180 MG (400 UNITS) capsule Take 400 Units by mouth 2 (two) times daily.   Yes [provider]    Inpatient Medications: Scheduled Meds:  apixaban  5 mg Oral BID   ascorbic acid  1,000 mg Oral BID   atorvastatin  20 mg Oral QHS   diltiazem  180 mg Oral Daily   doxycycline  100 mg Oral Q12H   FLUoxetine  40 mg Oral Daily   furosemide  40 mg Intravenous Q12H   ipratropium  0.5 mg Nebulization Q4H   methylPREDNISolone (SOLU-MEDROL) injection  80 mg Intravenous Q24H   metoprolol tartrate  25 mg Oral BID   mometasone-formoterol  2 puff Inhalation BID   nicotine  21 mg Transdermal Daily   pantoprazole  40 mg Oral Daily   Continuous Infusions:  diltiazem (CARDIZEM) infusion 12.5 mg/hr (10/30/20 0647)   PRN Meds: acetaminophen, dextromethorphan-guaiFENesin, hydrALAZINE, levalbuterol, ondansetron (ZOFRAN) IV  Allergies:   Allergies  Allergen Reactions   Codeine Other (See Comments)    weakness    Social History:   Social History   Socioeconomic History   Marital status: Divorced    Spouse name: Not on file   Number of children: Not on file   Years of education: Not on file   Highest education level: Not on file  Occupational History   Not on file  Tobacco Use   Smoking status: Every Day    Packs/day: 1.00    Years: 48.00    Pack years: 48.00    Types: Cigarettes   Smokeless tobacco: Never  Substance and Sexual Activity   Alcohol use: Not Currently   Drug use: Never   Sexual activity: Not on file  Other Topics Concern   Not on file  Social History Narrative   Lives locally.  Does not routinely exercise.   Social Determinants of Health   Financial Resource Strain: Not on file  Food Insecurity: Not on file  Transportation Needs: Not on file  Physical Activity: Not on file  Stress: Not on file  Social Connections: Not on file  Intimate Partner Violence: Not on file     Family History:   Family History  Problem Relation Age of Onset    COPD Mother    Heart failure Father    Heart disease Sister     ROS:  Review of Systems  Constitutional:  Positive for malaise/fatigue. Negative for chills, diaphoresis, fever and weight loss.  HENT:  Negative for congestion.   Eyes:  Negative for discharge and redness.  Respiratory:  Positive for cough and shortness of breath. Negative for sputum production and wheezing.  Cardiovascular:  Positive for leg swelling. Negative for chest pain, palpitations, orthopnea, claudication and PND.  Gastrointestinal:  Negative for abdominal pain, blood in stool, heartburn, melena, nausea and vomiting.  Musculoskeletal:  Negative for falls and myalgias.  Skin:  Negative for rash.  Neurological:  Negative for dizziness, tingling, tremors, sensory change, speech change, focal weakness, loss of consciousness and weakness.  Endo/Heme/Allergies:  Does not bruise/bleed easily.  Psychiatric/Behavioral:  Negative for substance abuse. The patient is not nervous/anxious.   All other systems reviewed and are negative.    Physical Exam/Data:   Vitals:   10/30/20 0300 10/30/20 0400 10/30/20 0500 10/30/20 0600  BP: 100/71 110/75 110/74 114/73  Pulse: (!) 54 64 82 (!) 153  Resp: 14 15 (!) 21 18  Temp:      TempSrc:      SpO2: 93% 93% 92% 90%    Intake/Output Summary (Last 24 hours) at 10/30/2020 0807 Last data filed at 10/30/2020 0448 Gross per 24 hour  Intake --  Output 500 ml  Net -500 ml   There were no vitals filed for this visit. There is no height or weight on file to calculate BMI.   Physical Exam: General: Well developed, well nourished, in no acute distress. Head: Normocephalic, atraumatic, sclera non-icteric, no xanthomas, nares without discharge.  Neck: Negative for carotid bruits. JVD not elevated. Lungs: Coarse breath sounds bilaterally with crackles along the bilateral bases. Breathing is unlabored. Heart: IRIR with S1 S2. No murmurs, rubs, or gallops appreciated. Abdomen: Soft,  non-tender, non-distended with normoactive bowel sounds. No hepatomegaly. No rebound/guarding. No obvious abdominal masses. Msk:  Strength and tone appear normal for age. Extremities: No clubbing or cyanosis. No edema. Distal pedal pulses are 2+ and equal bilaterally. Neuro: Alert and oriented X 3. No facial asymmetry. No focal deficit. Moves all extremities spontaneously. Psych:  Responds to questions appropriately with a normal affect.   EKG:  The EKG was personally reviewed and demonstrates: A. fib with RVR, 134 bpm, baseline wandering, nonspecific ST-T changes Telemetry:  Telemetry was personally reviewed and demonstrates: Afib, 90s to low 100s bpm  Weights: There were no vitals filed for this visit.  Relevant CV Studies:  TEE 10/26/2020: 1. Left ventricular ejection fraction, by estimation, is 45 to 50%. The  left ventricle has mildly decreased function. The left ventricle has no  regional wall motion abnormalities. There is mild left ventricular  hypertrophy. Left ventricular diastolic  parameters are indeterminate.   2. Right ventricular systolic function is normal. The right ventricular  size is normal. There is normal pulmonary artery systolic pressure. The  estimated right ventricular systolic pressure is 29.2 mmHg.   3. The mitral valve is normal in structure. Mild to moderate mitral valve  regurgitation. No evidence of mitral stenosis.   4. Tricuspid valve regurgitation is mild to moderate.   5. The aortic valve is normal in structure. Aortic valve regurgitation is  not visualized. Mild aortic valve sclerosis is present, with no evidence  of aortic valve stenosis.   6. The inferior vena cava is normal in size with greater than 50%  respiratory variability, suggesting right atrial pressure of 3 mmHg.   7. Agitated saline contrast bubble study was positive with shunting  observed within 3-6 cardiac cycles suggestive of interatrial shunt. There  is a small patent foramen  ovale with predominantly right to left shunting  across the atrial septum.   8. Rhythm is atrial fibrillation.   9. Left atrial size was moderately dilated.  No left atrial/left atrial  appendage thrombus was detected.  10. Right atrial size was moderately dilated.  11. There is mild (Grade II) atheroma plaque involving the descending  aorta.  __________  TEE 09/25/2020: 1. Left ventricular ejection fraction, by estimation, is 55 to 60%. The  left ventricle has normal function. The left ventricle has no regional  wall motion abnormalities.   2. Right ventricular systolic function is normal. The right ventricular  size is normal. There is normal pulmonary artery systolic pressure.   3. Left atrial size was mildly dilated. 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 Small mobile thrombus in mid LAA   4. Right atrial size was mildly dilated.   5. The mitral valve is normal in structure. Mild mitral valve  regurgitation. No evidence of mitral stenosis.   6. Tricuspid valve regurgitation is mild to moderate.   7. The aortic valve is tricuspid. Aortic valve regurgitation is not  visualized. No aortic stenosis is present.   8. There is Moderate (Grade III) plaque involving the descending aorta.   9. Agitated saline contrast bubble study was negative, with no evidence  of any interatrial shunt.  __________  2D echo 09/22/2020: 1. Left ventricular ejection fraction, by estimation, is 60 to 65%. The  left ventricle has normal function. The left ventricle has no regional  wall motion abnormalities. Left ventricular diastolic parameters are  indeterminate. The average left  ventricular global longitudinal strain is -9.4 %. The global longitudinal  strain is abnormal.   2. Right ventricular systolic function is normal. The right ventricular  size is normal. There is mildly elevated pulmonary artery systolic  pressure. The estimated right ventricular systolic pressure is 38.2  mmHg.   3. The mitral valve is normal in structure. Mild to moderate mitral valve  regurgitation.   4. Tricuspid valve regurgitation is moderate.   5. Left atrial size was mildly dilated.   6. Rhythm is atrial fibrillation, rate 120 bpm    Laboratory Data:  Chemistry Recent Labs  Lab 10/23/20 1550 10/26/20 2220 10/29/20 0836  NA 144 137 140  K 3.2* 3.6 4.0  CL 101 99 99  CO2 29 28 29   GLUCOSE 81 145* 98  BUN 12 20 23   CREATININE 1.02* 1.03* 1.03*  CALCIUM 8.5* 9.6 9.4  GFRNONAA  --  59* 59*  ANIONGAP  --  10 12    Recent Labs  Lab 10/26/20 2220 10/29/20 0836  PROT 6.3* 6.5  ALBUMIN 3.6 3.6  AST 85* 67*  ALT 62* 61*  ALKPHOS 64 67  BILITOT 1.0 1.0   Hematology Recent Labs  Lab 10/23/20 1550 10/26/20 2220 10/29/20 0836  WBC 4.0 5.6 11.2*  RBC 4.14 3.90 3.83*  HGB 13.7 13.2 13.1  HCT 40.6 38.9 39.0  MCV 98* 99.7 101.8*  MCH 33.1* 33.8 34.2*  MCHC 33.7 33.9 33.6  RDW 12.3 13.0 13.2  PLT 223 201 182   Cardiac EnzymesNo results for input(s): TROPONINI in the last 168 hours. No results for input(s): TROPIPOC in the last 168 hours.  BNP Recent Labs  Lab 10/26/20 2220 10/29/20 0836  BNP 522.0* 1,282.1*    DDimer No results for input(s): DDIMER in the last 168 hours.  Radiology/Studies:  DG Chest Portable 1 View  Result Date: 10/29/2020 IMPRESSION: Pulmonary vascular congestion. Electronically Signed   By: 10/31/20 M.D.   On: 10/29/2020 09:23   DG Chest Portable 1 View  Result Date: 10/26/2020 IMPRESSION: Mild cardiac enlargement and  mild pulmonary vascular congestion. Suggestion of developing interstitial edema in the lung bases. Possible small right pleural effusion. Electronically Signed   By: Burman Nieves M.D.   On: 10/26/2020 22:26   Assessment and Plan:   1.  Acute on chronic combined systolic and diastolic CHF: -Exacerbated by salt intake and recurrent Afib with RVR -Continue IV Lasix with KCl repletion as indicated -Continue  metoprolol as below -Look to repeat a limited echo in the outpatient setting, following rhythm control, to assess for improvement in systolic dysfunction, if this persists at that time, would look to pursue noninvasive ischemic evaluation  -Daily weights -Strict I/O  2.  Persistent A. fib with RVR: -She has had difficult to control Afib with initial DCCV in 09/2020 delayed secondary to LAA thrombus with repeat TEE in 10/2020 showing resolution of thrombus followed by briefly successful DCCV on 10/26/2020 -Now with recurrent Afib, and has been adherent to [facility] -Add amiodarone 400 mg bid x 1 week, followed by 200 mg bid x 1 week, followed by 200 mg daily thereafter in an effort to load prior to repeat DCCV, with timing to be determined based on rate control and symptoms while admitted  -Continue diltiazem gtt for now with plans to transition to oral diltiazem as ventricular rates stabilize  -Continue Lopressor, if needed for added ventricular rate control, could increase frequency to q 6 hours -Attempt to avoid digoxin given prior elevated level -CHA2DS2-VASc at least 4 (CHF, HTN, age x18, sex category) -Continue Eliquis  3.  COPD exacerbation: -Management per internal medicine -Wean steroids when possible  4.  HTN: -Blood pressure has been stable and well-controlled -Medical therapy as outlined above  5.  HLD: -LDL 93 -PTA atorvastatin   For questions or updates, please contact [facility] Please consult [email or web address] for contact info under Cardiology/STEMI.   Signed, Eula Listen, PA-C [facility] Pager: [phone] 10/30/2020, 8:07 AM

## 2020-10-31 DIAGNOSIS — I4819 Other persistent atrial fibrillation: Secondary | ICD-10-CM

## 2020-10-31 DIAGNOSIS — I5023 Acute on chronic systolic (congestive) heart failure: Secondary | ICD-10-CM | POA: Diagnosis not present

## 2020-10-31 LAB — COMPREHENSIVE METABOLIC PANEL
ALT: 82 U/L — ABNORMAL HIGH (ref 0–44)
AST: 82 U/L — ABNORMAL HIGH (ref 15–41)
Albumin: 3.6 g/dL (ref 3.5–5.0)
Alkaline Phosphatase: 67 U/L (ref 38–126)
Anion gap: 12 (ref 5–15)
BUN: 33 mg/dL — ABNORMAL HIGH (ref 8–23)
CO2: 28 mmol/L (ref 22–32)
Calcium: 8.9 mg/dL (ref 8.9–10.3)
Chloride: 95 mmol/L — ABNORMAL LOW (ref 98–111)
Creatinine, Ser: 0.97 mg/dL (ref 0.44–1.00)
GFR, Estimated: >60 mL/min (ref >60)
Glucose, Bld: 165 mg/dL — ABNORMAL HIGH (ref 70–99)
Potassium: 3.4 mmol/L — ABNORMAL LOW (ref 3.5–5.1)
Sodium: 135 mmol/L (ref 135–145)
Total Bilirubin: 0.9 mg/dL (ref 0.3–1.2)
Total Protein: 6.5 g/dL (ref 6.5–8.1)

## 2020-10-31 LAB — MAGNESIUM
Magnesium: 1.8 mg/dL (ref 1.7–2.4)
Magnesium: 2.1 mg/dL (ref 1.7–2.4)

## 2020-10-31 LAB — CBC
HCT: 35.5 % — ABNORMAL LOW (ref 36.0–46.0)
Hemoglobin: 12.1 g/dL (ref 12.0–15.0)
MCH: 33.3 pg (ref 26.0–34.0)
MCHC: 34.1 g/dL (ref 30.0–36.0)
MCV: 97.8 fL (ref 80.0–100.0)
Platelets: 166 10*3/uL (ref 150–400)
RBC: 3.63 MIL/uL — ABNORMAL LOW (ref 3.87–5.11)
RDW: 13 % (ref 11.5–15.5)
WBC: 8.1 10*3/uL (ref 4.0–10.5)
nRBC: 0 % (ref 0.0–0.2)

## 2020-10-31 LAB — BASIC METABOLIC PANEL
Anion gap: 11 (ref 5–15)
BUN: 37 mg/dL — ABNORMAL HIGH (ref 8–23)
CO2: 27 mmol/L (ref 22–32)
Calcium: 9 mg/dL (ref 8.9–10.3)
Chloride: 94 mmol/L — ABNORMAL LOW (ref 98–111)
Creatinine, Ser: 1.25 mg/dL — ABNORMAL HIGH (ref 0.44–1.00)
GFR, Estimated: 47 mL/min — ABNORMAL LOW (ref >60)
Glucose, Bld: 141 mg/dL — ABNORMAL HIGH (ref 70–99)
Potassium: 4.1 mmol/L (ref 3.5–5.1)
Sodium: 132 mmol/L — ABNORMAL LOW (ref 135–145)

## 2020-10-31 MED ORDER — ADULT MULTIVITAMIN W/MINERALS CH
1.0000 | ORAL_TABLET | Freq: Every day | ORAL | Status: DC
Start: 2020-10-31 — End: 2020-11-03
  Administered 2020-11-01 – 2020-11-03 (×3): 1 via ORAL
  Filled 2020-10-31 (×3): qty 1

## 2020-10-31 MED ORDER — TRAZODONE HCL 50 MG PO TABS: 50.0000 mg | ORAL_TABLET | Freq: Every evening | ORAL | Status: DC | PRN

## 2020-10-31 MED ORDER — AMIODARONE HCL IN DEXTROSE 360-4.14 MG/200ML-% IV SOLN
60.0000 mg/h | INTRAVENOUS | Status: AC
  Administered 2020-10-31: 60 mg/h via INTRAVENOUS
  Filled 2020-10-31: qty 200

## 2020-10-31 MED ORDER — AMIODARONE HCL IN DEXTROSE 360-4.14 MG/200ML-% IV SOLN
30.0000 mg/h | INTRAVENOUS | Status: DC
  Administered 2020-10-31 – 2020-11-01 (×4): 30 mg/h via INTRAVENOUS
  Filled 2020-10-31 (×3): qty 200

## 2020-10-31 MED ORDER — FUROSEMIDE 40 MG PO TABS
40.0000 mg | ORAL_TABLET | Freq: Every day | ORAL | Status: DC
  Administered 2020-10-31 – 2020-11-03 (×4): 40 mg via ORAL
  Filled 2020-10-31 (×4): qty 1

## 2020-10-31 MED ORDER — AMIODARONE LOAD VIA INFUSION
150.0000 mg | Freq: Once | INTRAVENOUS | Status: AC
  Administered 2020-10-31: 150 mg via INTRAVENOUS
  Filled 2020-10-31: qty 83.34

## 2020-10-31 MED ORDER — POTASSIUM CHLORIDE CRYS ER 20 MEQ PO TBCR
40.0000 meq | EXTENDED_RELEASE_TABLET | Freq: Two times a day (BID) | ORAL | Status: DC
  Administered 2020-10-31 – 2020-11-03 (×7): 40 meq via ORAL
  Filled 2020-10-31: qty 2
  Filled 2020-10-31: qty 4
  Filled 2020-10-31: qty 2
  Filled 2020-10-31 (×2): qty 4
  Filled 2020-10-31: qty 2
  Filled 2020-10-31: qty 4

## 2020-10-31 MED ORDER — VITAMIN E 45 MG (100 UNIT) PO CAPS
400.0000 [IU] | ORAL_CAPSULE | Freq: Two times a day (BID) | ORAL | Status: DC
  Administered 2020-10-31 – 2020-11-03 (×6): 400 [IU] via ORAL
  Filled 2020-10-31 (×7): qty 4

## 2020-10-31 MED ORDER — METHYLPREDNISOLONE SODIUM SUCC 40 MG IJ SOLR
40.0000 mg | INTRAMUSCULAR | Status: DC
  Administered 2020-10-31 – 2020-11-01 (×2): 40 mg via INTRAVENOUS
  Filled 2020-10-31 (×2): qty 1

## 2020-10-31 MED ORDER — MELATONIN 5 MG PO TABS
5.0000 mg | ORAL_TABLET | Freq: Every day | ORAL | Status: DC
  Administered 2020-10-31 – 2020-11-02 (×3): 5 mg via ORAL
  Filled 2020-10-31 (×3): qty 1

## 2020-10-31 NOTE — Progress Notes (Signed)
PROGRESS NOTE    Angela Singh   ZSW:[id number]  DOB: 06-12-1950  PCP: Oswaldo Conroy, MD    DOA: 10/29/2020 LOS: 2    Brief Narrative / Hospital Course to Date:   70 year old female with past medical history of CHF related to difficult to control paroxysmal A. fib with RVR (status post cardioversion on 10/26/2020), hypertension, hyperlipidemia, COPD not on home oxygen, GERD, tobacco abuse who presented to the ED on 10/29/2020 with persistent shortness of breath.  She had been seen in the ED 2 days prior diagnosed with COPD exacerbation, discharged with prednisone and Zithromax.  Her symptoms failed to improve and she was having recurrent heart palpitations.  Return to the ER where she was found to have A. fib with RVR with heart rate into the 130s with associated decompensated heart failure.  Started on Cardizem drip and IV diuresis.  Admitted to [facility] service with cardiology consulted.  Assessment & Plan   Principal Problem:   Acute on chronic systolic CHF (congestive heart failure) (HCC) Active Problems:   Atrial fibrillation with RVR (HCC)   Essential hypertension   Hyperlipidemia   COPD exacerbation (HCC)   Tobacco abuse   Sepsis (HCC)   Abnormal LFTs   Acute on chronic systolic CHF -EF 45 to 50% on TEE 10/26/2020.  Presented with pulmonary vascular congestion on chest x-ray and some lower extremity edema, elevated BNP in the setting of A. fib RVR -consistent with decompensation requiring IV diuresis. --Cardiology managing --Treated with IV Lasix, transitioned to oral today --Continue metoprolol --Monitor renal function and electrolytes --Strict I/O's and daily weights  Persistent A. fib with RVR -patient has history of difficult to control A. fib.  Recent cardioversion on 10/26/2020 was successful but now with recurrent A. fib. --Continue Eliquis --On amiodarone and diltiazem drips --Attempt to wean of dilt drip today if HR controlled --As needed Lopressor if needed  for additional rate control --Prior toxicity with digoxin, attempt to avoid  COPD with acute exacerbation -POA wheezing on exam and shortness of breath. --Continue IV steroids doxycycline and bronchodilators --Decrease Solu-Medrol to 40 mg daily --Supplement oxygen if needed to maintain sats above 90% --Follow sputum culture  Hypokalemia - replacing today for K 3.4. --Maintain K>4 and Mg>2 in setting of a-fib  Sepsis ruled out -patient does not have any evidence of infection.  Tachycardia and tachypnea secondary to A. fib with RVR and CHF decompensation.  Procalcitonin and lactic acid both negative  Hypertension -BP controlled.  On metoprolol and Cardizem in addition to IV Lasix as above  Hyperlipidemia -continue atorvastatin  Tobacco abuse -patient counseled on importance of cessation.  Nicotine patch ordered  Mild transaminitis -present on admission, likely due to liver congestion in the setting of CHF decompensation.  Follow LFTs.  Patient BMI: Body mass index is 19.81 kg/m.   DVT prophylaxis:  apixaban (ELIQUIS) tablet 5 mg   Diet:  Diet Orders (From admission, onward)     Start     Ordered   11/01/20 0001  Diet NPO time specified Except for: Sips with Meds  Diet effective midnight       Question:  Except for  Answer:  Clearance Coots with Meds   10/31/20 3220   10/30/20 1210  Diet regular Room service appropriate? Yes; Fluid consistency: Thin  Diet effective now       Question Answer Comment  Room service appropriate? Yes   Fluid consistency: Thin      10/30/20 1209  Code Status: Prior   Subjective 10/31/20    Patient awake sitting up in bed when seen this morning.  She reports not sleeping last night.  Noted some palpitations when she got up to ambulate to the bathroom.  Otherwise no palpitations, chest pain or shortness of breath.  No acute events reported.  She has about when she will be able to go home.   Disposition Plan & Communication   Status is:  Inpatient  Remains inpatient appropriate because:IV treatments appropriate due to intensity of illness or inability to take PO  Dispo: The patient is from: Home              Anticipated d/c is to: Home              Patient currently is not medically stable to d/c.   Difficult to place patient No    Consults, Procedures, Significant Events   Consultants:  Cardiology  Procedures:  None  Antimicrobials:  Anti-infectives (From admission, onward)    Start     Dose/Rate Route Frequency Ordered Stop   10/29/20 1130  doxycycline (VIBRA-TABS) tablet 100 mg        100 mg Oral Every 12 hours 10/29/20 1047 11/03/20 0959         Micro    Objective   Vitals:   10/31/20 0821 10/31/20 1153 10/31/20 1410 10/31/20 1526  BP: 109/75 109/81  117/65  Pulse: 86 (!) 101  78  Resp: 18   17  Temp: 97.8 F (36.6 C) 98.1 F (36.7 C)  (!) 97.5 F (36.4 C)  TempSrc:  Oral    SpO2: 99% 94% 94% 92%  Weight:      Height:        Intake/Output Summary (Last 24 hours) at 10/31/2020 1531 Last data filed at 10/31/2020 1332 Gross per 24 hour  Intake 1079.66 ml  Output 300 ml  Net 779.66 ml   Filed Weights   10/30/20 1517 10/31/20 0500  Weight: 49.9 kg 52.3 kg    Physical Exam:  General exam: awake, alert, no acute distress Respiratory system: lungs clear b/l with poor air movement but no expiratory wheezes, no rales or rhonchi, normal respiratory effort, on room air Cardiovascular system: Irregular rhythm, regular rate, no lower extremity edema.   Central nervous system: A&O x4. no gross focal neurologic deficits, normal speech Psychiatry: normal mood, congruent affect, judgement and insight appear normal  Labs   Data Reviewed: I have personally reviewed following labs and imaging studies  CBC: Recent Labs  Lab 10/26/20 2220 10/29/20 0836 10/31/20 0428  WBC 5.6 11.2* 8.1  NEUTROABS 3.7 8.8*  --   HGB 13.2 13.1 12.1  HCT 38.9 39.0 35.5*  MCV 99.7 101.8* 97.8  PLT 201 182 166    Basic Metabolic Panel: Recent Labs  Lab 10/26/20 2220 10/29/20 0836 10/31/20 0428  NA 137 140 135  K 3.6 4.0 3.4*  CL 99 99 95*  CO2 28 29 28   GLUCOSE 145* 98 165*  BUN 20 23 33*  CREATININE 1.03* 1.03* 0.97  CALCIUM 9.6 9.4 8.9  MG  --   --  1.8   GFR: Estimated Creatinine Clearance: 45.8 mL/min (by C-G formula based on SCr of 0.97 mg/dL). Liver Function Tests: Recent Labs  Lab 10/26/20 2220 10/29/20 0836 10/31/20 0428  AST 85* 67* 82*  ALT 62* 61* 82*  ALKPHOS 64 67 67  BILITOT 1.0 1.0 0.9  PROT 6.3* 6.5 6.5  ALBUMIN 3.6 3.6 3.6  No results for input(s): LIPASE, AMYLASE in the last 168 hours. No results for input(s): AMMONIA in the last 168 hours. Coagulation Profile: No results for input(s): INR, PROTIME in the last 168 hours. Cardiac Enzymes: No results for input(s): CKTOTAL, CKMB, CKMBINDEX, TROPONINI in the last 168 hours. BNP (last 3 results) No results for input(s): PROBNP in the last 8760 hours. HbA1C: No results for input(s): HGBA1C in the last 72 hours. CBG: No results for input(s): GLUCAP in the last 168 hours. Lipid Profile: No results for input(s): CHOL, HDL, LDLCALC, TRIG, CHOLHDL, LDLDIRECT in the last 72 hours. Thyroid Function Tests: No results for input(s): TSH, T4TOTAL, FREET4, T3FREE, THYROIDAB in the last 72 hours. Anemia Panel: No results for input(s): VITAMINB12, FOLATE, FERRITIN, TIBC, IRON, RETICCTPCT in the last 72 hours. Sepsis Labs: Recent Labs  Lab 10/29/20 1117 10/30/20 0728  PROCALCITON <0.10  --   LATICACIDVEN 1.9 1.3    Recent Results (from the past 240 hour(s))  SARS CORONAVIRUS 2 (TAT 6-24 HRS) Nasopharyngeal Nasopharyngeal Swab     Status: None   Collection Time: 10/25/20  9:22 AM   Specimen: Nasopharyngeal Swab  Result Value Ref Range Status   SARS Coronavirus 2 NEGATIVE NEGATIVE Final    Comment: (NOTE) SARS-CoV-2 target nucleic acids are NOT DETECTED.  The SARS-CoV-2 RNA is generally detectable in upper  and lower respiratory specimens during the acute phase of infection. Negative results do not preclude SARS-CoV-2 infection, do not rule out co-infections with other pathogens, and should not be used as the sole basis for treatment or other patient management decisions. Negative results must be combined with clinical observations, patient history, and epidemiological information. The expected result is Negative.  Fact Sheet for Patients: HairSlick.no  Fact Sheet for Healthcare Providers: quierodirigir.com  This test is not yet approved or cleared by the [place] FDA and  has been authorized for detection and/or diagnosis of SARS-CoV-2 by FDA under an Emergency Use Authorization (EUA). This EUA will remain  in effect (meaning this test can be used) for the duration of the COVID-19 declaration under Se ction 564(b)(1) of the Act, 21 U.S.C. section 360bbb-3(b)(1), unless the authorization is terminated or revoked sooner.  Performed at [facility], [address] [place], [place] [address]   Resp Panel by RT-PCR (Flu A&B, Covid) Nasopharyngeal Swab     Status: None   Collection Time: 10/29/20  8:54 AM   Specimen: Nasopharyngeal Swab; Nasopharyngeal(NP) swabs in vial transport medium  Result Value Ref Range Status   SARS Coronavirus 2 by RT PCR NEGATIVE NEGATIVE Final    Comment: (NOTE) SARS-CoV-2 target nucleic acids are NOT DETECTED.  The SARS-CoV-2 RNA is generally detectable in upper respiratory specimens during the acute phase of infection. The lowest concentration of SARS-CoV-2 viral copies this assay can detect is 138 copies/mL. A negative result does not preclude SARS-Cov-2 infection and should not be used as the sole basis for treatment or other patient management decisions. A negative result may occur with  improper specimen collection/handling, submission of specimen other than nasopharyngeal swab,  presence of viral mutation(s) within the areas targeted by this assay, and inadequate number of viral copies(<138 copies/mL). A negative result must be combined with clinical observations, patient history, and epidemiological information. The expected result is Negative.  Fact Sheet for Patients:  BloggerCourse.com  Fact Sheet for Healthcare Providers:  SeriousBroker.it  This test is no t yet approved or cleared by the [place] and  has been authorized for  detection and/or diagnosis of SARS-CoV-2 by FDA under an Emergency Use Authorization (EUA). This EUA will remain  in effect (meaning this test can be used) for the duration of the COVID-19 declaration under Section 564(b)(1) of the Act, 21 U.S.C.section 360bbb-3(b)(1), unless the authorization is terminated  or revoked sooner.       Influenza A by PCR NEGATIVE NEGATIVE Final   Influenza B by PCR NEGATIVE NEGATIVE Final    Comment: (NOTE) The Xpert Xpress SARS-CoV-2/FLU/RSV plus assay is intended as an aid in the diagnosis of influenza from Nasopharyngeal swab specimens and should not be used as a sole basis for treatment. Nasal washings and aspirates are unacceptable for Xpert Xpress SARS-CoV-2/FLU/RSV testing.  Fact Sheet for Patients: BloggerCourse.com  Fact Sheet for Healthcare Providers: SeriousBroker.it  This test is not yet approved or cleared by the [place] FDA and has been authorized for detection and/or diagnosis of SARS-CoV-2 by FDA under an Emergency Use Authorization (EUA). This EUA will remain in effect (meaning this test can be used) for the duration of the COVID-19 declaration under Section 564(b)(1) of the Act, 21 U.S.C. section 360bbb-3(b)(1), unless the authorization is terminated or revoked.  Performed at [facility], [address] [place], [place] [address]       Imaging  Studies   No results found.   Medications   Scheduled Meds:  apixaban  5 mg Oral BID   ascorbic acid  1,000 mg Oral BID   atorvastatin  20 mg Oral QHS   diltiazem  180 mg Oral Daily   doxycycline  100 mg Oral Q12H   FLUoxetine  40 mg Oral Daily   furosemide  40 mg Oral Daily   ipratropium  0.5 mg Nebulization Q6H   melatonin  5 mg Oral QHS   methylPREDNISolone (SOLU-MEDROL) injection  40 mg Intravenous Q24H   metoprolol tartrate  25 mg Oral BID   mometasone-formoterol  2 puff Inhalation BID   nicotine  21 mg Transdermal Daily   pantoprazole  40 mg Oral Daily   potassium chloride  40 mEq Oral BID   Continuous Infusions:  amiodarone 30 mg/hr (10/31/20 1418)       LOS: 2 days    Time spent: 25 minutes with > 50% spent at bedside and in coordination of care     Pennie Banter, DO [facility]  10/31/2020, 3:31 PM      If 7PM-7AM, please contact night-coverage. How to contact the [facility] Attending or Consulting provider 7A - 7P or covering provider during after hours 7P -7A, for this patient?    Check the care team in [facility] and look for a) attending/consulting TR[facility] provider listed and b) the [facility] team listed Log into [email or web address] and use [place]'s universal password to access. If you do not have the password, please contact the hospital operator. Locate the [facility] provider you are looking for under [facility] and page to a number that you can be directly reached. If you still have difficulty reaching the provider, please page the [facility] (Director on Call) for the Hospitalists listed on amion for assistance.

## 2020-10-31 NOTE — Progress Notes (Signed)
Progress Note  Patient Name: Angela Singh Date of Encounter: 10/31/2020  Primary Cardiologist: Mariah Milling  Subjective   She remains in Afib with ventricular rates in the 90s to 130s bpm. She feels like her dyspnea is improving, though she remains SOB with ambulation in the room to the restroom. No angina.   Inpatient Medications    Scheduled Meds:  amiodarone  400 mg Oral BID   apixaban  5 mg Oral BID   ascorbic acid  1,000 mg Oral BID   atorvastatin  20 mg Oral QHS   diltiazem  180 mg Oral Daily   doxycycline  100 mg Oral Q12H   FLUoxetine  40 mg Oral Daily   furosemide  40 mg Intravenous Q12H   ipratropium  0.5 mg Nebulization Q6H   methylPREDNISolone (SOLU-MEDROL) injection  80 mg Intravenous Q24H   metoprolol tartrate  25 mg Oral BID   mometasone-formoterol  2 puff Inhalation BID   nicotine  21 mg Transdermal Daily   pantoprazole  40 mg Oral Daily   Continuous Infusions:  diltiazem (CARDIZEM) infusion 12.5 mg/hr (10/31/20 0526)   PRN Meds: acetaminophen, dextromethorphan-guaiFENesin, hydrALAZINE, levalbuterol, ondansetron (ZOFRAN) IV   Vital Signs    Vitals:   10/30/20 2009 10/31/20 0001 10/31/20 0419 10/31/20 0500  BP:  113/60 105/70   Pulse:  95 90   Resp:  20 20   Temp:  98.5 F (36.9 C) 98 F (36.7 C)   TempSrc:  Oral Oral   SpO2: 94% 94% 93%   Weight:    52.3 kg  Height:        Intake/Output Summary (Last 24 hours) at 10/31/2020 0710 Last data filed at 10/30/2020 1839 Gross per 24 hour  Intake 835.16 ml  Output 300 ml  Net 535.16 ml   Filed Weights   10/30/20 1517 10/31/20 0500  Weight: 49.9 kg 52.3 kg    Telemetry    Afib with ventricular rates in the 90s to 130s bpm - Personally Reviewed  ECG    No new tracings - Personally Reviewed  Physical Exam   GEN: No acute distress.   Neck: No JVD. Cardiac: IRIR, no murmurs, rubs, or gallops.  Respiratory: Improving crackles with noted expiratory wheezing.  GI: Soft, nontender,  non-distended.   MS: No edema; No deformity. Neuro:  Alert and oriented x 3; Nonfocal.  Psych: Normal affect.  Labs    Chemistry Recent Labs  Lab 10/26/20 2220 10/29/20 0836 10/31/20 0428  NA 137 140 135  K 3.6 4.0 3.4*  CL 99 99 95*  CO2 28 29 28   GLUCOSE 145* 98 165*  BUN 20 23 33*  CREATININE 1.03* 1.03* 0.97  CALCIUM 9.6 9.4 8.9  PROT 6.3* 6.5 6.5  ALBUMIN 3.6 3.6 3.6  AST 85* 67* 82*  ALT 62* 61* 82*  ALKPHOS 64 67 67  BILITOT 1.0 1.0 0.9  GFRNONAA 59* 59* >60  ANIONGAP 10 12 12      Hematology Recent Labs  Lab 10/26/20 2220 10/29/20 0836 10/31/20 0428  WBC 5.6 11.2* 8.1  RBC 3.90 3.83* 3.63*  HGB 13.2 13.1 12.1  HCT 38.9 39.0 35.5*  MCV 99.7 101.8* 97.8  MCH 33.8 34.2* 33.3  MCHC 33.9 33.6 34.1  RDW 13.0 13.2 13.0  PLT 201 182 166    Cardiac EnzymesNo results for input(s): TROPONINI in the last 168 hours. No results for input(s): TROPIPOC in the last 168 hours.   BNP Recent Labs  Lab 10/26/20 2220 10/29/20  0836  BNP 522.0* 1,282.1*     DDimer No results for input(s): DDIMER in the last 168 hours.   Radiology    DG Chest Portable 1 View  Result Date: 10/29/2020 IMPRESSION: Pulmonary vascular congestion. Electronically Signed   By: Signa Kell M.D.   On: 10/29/2020 09:23    Cardiac Studies   TEE 10/26/2020: 1. Left ventricular ejection fraction, by estimation, is 45 to 50%. The  left ventricle has mildly decreased function. The left ventricle has no  regional wall motion abnormalities. There is mild left ventricular  hypertrophy. Left ventricular diastolic  parameters are indeterminate.   2. Right ventricular systolic function is normal. The right ventricular  size is normal. There is normal pulmonary artery systolic pressure. The  estimated right ventricular systolic pressure is 29.2 mmHg.   3. The mitral valve is normal in structure. Mild to moderate mitral valve  regurgitation. No evidence of mitral stenosis.   4. Tricuspid valve  regurgitation is mild to moderate.   5. The aortic valve is normal in structure. Aortic valve regurgitation is  not visualized. Mild aortic valve sclerosis is present, with no evidence  of aortic valve stenosis.   6. The inferior vena cava is normal in size with greater than 50%  respiratory variability, suggesting right atrial pressure of 3 mmHg.   7. Agitated saline contrast bubble study was positive with shunting  observed within 3-6 cardiac cycles suggestive of interatrial shunt. There  is a small patent foramen ovale with predominantly right to left shunting  across the atrial septum.   8. Rhythm is atrial fibrillation.   9. Left atrial size was moderately dilated. No left atrial/left atrial  appendage thrombus was detected.  10. Right atrial size was moderately dilated.  11. There is mild (Grade II) atheroma plaque involving the descending  aorta.  __________   TEE 09/25/2020: 1. Left ventricular ejection fraction, by estimation, is 55 to 60%. The  left ventricle has normal function. The left ventricle has no regional  wall motion abnormalities.   2. Right ventricular systolic function is normal. The right ventricular  size is normal. There is normal pulmonary artery systolic pressure.   3. Left atrial size was mildly dilated. 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 Small mobile thrombus in mid LAA   4. Right atrial size was mildly dilated.   5. The mitral valve is normal in structure. Mild mitral valve  regurgitation. No evidence of mitral stenosis.   6. Tricuspid valve regurgitation is mild to moderate.   7. The aortic valve is tricuspid. Aortic valve regurgitation is not  visualized. No aortic stenosis is present.   8. There is Moderate (Grade III) plaque involving the descending aorta.   9. Agitated saline contrast bubble study was negative, with no evidence  of any interatrial shunt.  __________   2D echo 09/22/2020: 1. Left ventricular ejection  fraction, by estimation, is 60 to 65%. The  left ventricle has normal function. The left ventricle has no regional  wall motion abnormalities. Left ventricular diastolic parameters are  indeterminate. The average left  ventricular global longitudinal strain is -9.4 %. The global longitudinal  strain is abnormal.   2. Right ventricular systolic function is normal. The right ventricular  size is normal. There is mildly elevated pulmonary artery systolic  pressure. The estimated right ventricular systolic pressure is 38.2 mmHg.   3. The mitral valve is normal in structure. Mild to moderate mitral valve  regurgitation.  4. Tricuspid valve regurgitation is moderate.   5. Left atrial size was mildly dilated.   6. Rhythm is atrial fibrillation, rate 120 bpm   Patient Profile     70 y.o. female with history of HFpEF, persistent A. fib status post DCCV, HTN, HLD, COPD, tobacco use, and GERD who is being seen today for the evaluation of recurrent A. fib with RVR and HFpEF.  Assessment & Plan    1. Acute on chronic combined systolic and diastolic CHF: -Exacerbated by salt intake and recurrent Afib with RVR -Slight increase in BUN today, transition from IV Lasix to oral -Continue metoprolol as below -Look to repeat a limited echo in the outpatient setting, following rhythm control, to assess for improvement in systolic dysfunction, if this persists at that time, would look to pursue noninvasive ischemic evaluation  -Daily weights -Strict I/O   2.  Persistent A. fib with RVR: -She has had difficult to control Afib with initial DCCV in 09/2020 delayed secondary to LAA thrombus with repeat TEE in 10/2020 showing resolution of thrombus followed by briefly successful DCCV on 10/26/2020 -Now with recurrent Afib, and has been [facility] -Transition oral amiodarone to IV amiodarone today in preparation for DCCV 9/28, following DCCV, look to resume oral amiodarone -With the transition to IV  amiodarone, hopefully we can taper off diltiazem gtt today -Continue Lopressor, if needed for added ventricular rate control, could increase frequency to q 6 hours -Avoid digoxin given prior elevated level -CHA2DS2-VASc at least 4 (CHF, HTN, age x54, sex category) -Continue Eliquis, she confirms she has not missed any doses of OAC   3.  COPD exacerbation: -Management per internal medicine -Wean steroids when possible   4.  HTN: -Blood pressure has been stable and well-controlled -Medical therapy as outlined above   5.  HLD: -LDL 93 -PTA atorvastatin  6. Hypokalemia: -Replete to goal 4.0   For questions or updates, please contact [facility] Please consult [email or web address] for contact info under Cardiology/STEMI.    Signed, Eula Listen, PA-C [facility] Pager: [phone] 10/31/2020, 7:10 AM

## 2020-11-01 ENCOUNTER — Inpatient Hospital Stay: Payer: Medicare HMO

## 2020-11-01 ENCOUNTER — Encounter: Payer: Self-pay | Admitting: Cardiology

## 2020-11-01 ENCOUNTER — Encounter
Admission: EM | Disposition: A | Discharge status: Home / Self Care | Payer: Self-pay | Source: Home / Self Care | Attending: Hospitalist

## 2020-11-01 DIAGNOSIS — I509 Heart failure, unspecified: Secondary | ICD-10-CM

## 2020-11-01 DIAGNOSIS — Z720 Tobacco use: Secondary | ICD-10-CM

## 2020-11-01 DIAGNOSIS — I4891 Unspecified atrial fibrillation: Secondary | ICD-10-CM | POA: Diagnosis not present

## 2020-11-01 DIAGNOSIS — I5023 Acute on chronic systolic (congestive) heart failure: Secondary | ICD-10-CM | POA: Diagnosis not present

## 2020-11-01 HISTORY — PX: CARDIOVERSION: SHX1299

## 2020-11-01 LAB — COMPREHENSIVE METABOLIC PANEL
ALT: 121 U/L — ABNORMAL HIGH (ref 0–44)
AST: 96 U/L — ABNORMAL HIGH (ref 15–41)
Albumin: 3.3 g/dL — ABNORMAL LOW (ref 3.5–5.0)
Alkaline Phosphatase: 71 U/L (ref 38–126)
Anion gap: 12 (ref 5–15)
BUN: 35 mg/dL — ABNORMAL HIGH (ref 8–23)
CO2: 26 mmol/L (ref 22–32)
Calcium: 8.9 mg/dL (ref 8.9–10.3)
Chloride: 96 mmol/L — ABNORMAL LOW (ref 98–111)
Creatinine, Ser: 0.98 mg/dL (ref 0.44–1.00)
GFR, Estimated: >60 mL/min (ref >60)
Glucose, Bld: 177 mg/dL — ABNORMAL HIGH (ref 70–99)
Potassium: 4.2 mmol/L (ref 3.5–5.1)
Sodium: 134 mmol/L — ABNORMAL LOW (ref 135–145)
Total Bilirubin: 0.9 mg/dL (ref 0.3–1.2)
Total Protein: 6 g/dL — ABNORMAL LOW (ref 6.5–8.1)

## 2020-11-01 LAB — CBC
HCT: 37.5 % (ref 36.0–46.0)
Hemoglobin: 12.9 g/dL (ref 12.0–15.0)
MCH: 33.2 pg (ref 26.0–34.0)
MCHC: 34.4 g/dL (ref 30.0–36.0)
MCV: 96.6 fL (ref 80.0–100.0)
Platelets: 183 10*3/uL (ref 150–400)
RBC: 3.88 MIL/uL (ref 3.87–5.11)
RDW: 12.9 % (ref 11.5–15.5)
WBC: 9.3 10*3/uL (ref 4.0–10.5)
nRBC: 0 % (ref 0.0–0.2)

## 2020-11-01 LAB — MAGNESIUM: Magnesium: 2 mg/dL (ref 1.7–2.4)

## 2020-11-01 SURGERY — CARDIOVERSION
Anesthesia: General

## 2020-11-01 MED ORDER — METOPROLOL TARTRATE 50 MG PO TABS
50.0000 mg | ORAL_TABLET | Freq: Two times a day (BID) | ORAL | Status: DC
  Administered 2020-11-01 – 2020-11-03 (×4): 50 mg via ORAL
  Filled 2020-11-01 (×4): qty 1

## 2020-11-01 MED ORDER — PREDNISONE 20 MG PO TABS
40.0000 mg | ORAL_TABLET | Freq: Every day | ORAL | Status: DC
  Administered 2020-11-02 – 2020-11-03 (×2): 40 mg via ORAL
  Filled 2020-11-01 (×2): qty 2

## 2020-11-01 MED ORDER — IPRATROPIUM BROMIDE 0.02 % IN SOLN
0.5000 mg | Freq: Two times a day (BID) | RESPIRATORY_TRACT | Status: DC
  Administered 2020-11-02 – 2020-11-03 (×3): 0.5 mg via RESPIRATORY_TRACT
  Filled 2020-11-01 (×3): qty 2.5

## 2020-11-01 MED ORDER — SODIUM CHLORIDE 0.9 % IV SOLN: INTRAVENOUS | Status: DC | PRN

## 2020-11-01 MED ORDER — SODIUM CHLORIDE 0.9 % IV SOLN: INTRAVENOUS | Status: DC

## 2020-11-01 MED ORDER — PROPOFOL 10 MG/ML IV BOLUS
INTRAVENOUS | Status: DC | PRN
  Administered 2020-11-01: 30 mg via INTRAVENOUS
  Administered 2020-11-01: 20 mg via INTRAVENOUS

## 2020-11-01 NOTE — Progress Notes (Signed)
Progress Note  Patient Name: Angela Singh Date of Encounter: 11/01/2020  [facility] Cardiologist: Julien Nordmann, MD   Subjective   Doing ok denies cp or sob. No additional questions prior to cardioversion  Inpatient Medications    Scheduled Meds:  [MAR Hold] apixaban  5 mg Oral BID   [MAR Hold] ascorbic acid  1,000 mg Oral BID   [MAR Hold] atorvastatin  20 mg Oral QHS   [MAR Hold] diltiazem  180 mg Oral Daily   [MAR Hold] doxycycline  100 mg Oral Q12H   [MAR Hold] FLUoxetine  40 mg Oral Daily   [MAR Hold] furosemide  40 mg Oral Daily   [MAR Hold] ipratropium  0.5 mg Nebulization Q6H   [MAR Hold] melatonin  5 mg Oral QHS   [MAR Hold] methylPREDNISolone (SOLU-MEDROL) injection  40 mg Intravenous Q24H   [MAR Hold] metoprolol tartrate  25 mg Oral BID   [MAR Hold] mometasone-formoterol  2 puff Inhalation BID   [MAR Hold] multivitamin with minerals  1 tablet Oral Daily   [MAR Hold] nicotine  21 mg Transdermal Daily   [MAR Hold] pantoprazole  40 mg Oral Daily   [MAR Hold] potassium chloride  40 mEq Oral BID   [MAR Hold] vitamin E  400 Units Oral BID   Continuous Infusions:  sodium chloride 10 mL/hr at 11/01/20 0808   amiodarone 30 mg/hr (11/01/20 0756)   PRN Meds: [KGY Hold] acetaminophen, [MAR Hold] dextromethorphan-guaiFENesin, [MAR Hold] hydrALAZINE, [MAR Hold] levalbuterol, [MAR Hold] ondansetron (ZOFRAN) IV, [MAR Hold] traZODone   Vital Signs    Vitals:   11/01/20 0000 11/01/20 0500 11/01/20 0805 11/01/20 0825  BP: 121/79 118/72 95/70   Pulse:  (!) 102 91 (!) 57  Resp:  17 17 12   Temp: 98.2 F (36.8 C) 98.1 F (36.7 C) 97.8 F (36.6 C)   TempSrc: Oral Oral Oral   SpO2: 95% 95% 93% 94%  Weight:  55.2 kg    Height:        Intake/Output Summary (Last 24 hours) at 11/01/2020 0829 Last data filed at 11/01/2020 0756 Gross per 24 hour  Intake 920.86 ml  Output --  Net 920.86 ml   Last 3 Weights 11/01/2020 10/31/2020 10/30/2020  Weight (lbs) 121 lb 11.1 oz  115 lb 6.4 oz 110 lb  Weight (kg) 55.2 kg 52.345 kg 49.896 kg      Telemetry    Afib 98 - Personally Reviewed  ECG     - Personally Reviewed  Physical Exam   GEN: No acute distress.   Neck: No JVD Cardiac: irregular irregular Respiratory: ronchi noted at bases GI: Soft, nontender, non-distended  MS: No edema; No deformity. Neuro:  Nonfocal  Psych: Normal affect   Labs    High Sensitivity Troponin:   Recent Labs  Lab 10/12/20 1820 10/12/20 2117 10/26/20 2220 10/27/20 0027 10/29/20 0836  TROPONINIHS 26* 25* 19* 16 17     Chemistry Recent Labs  Lab 10/29/20 0836 10/31/20 0428 10/31/20 2000 11/01/20 0500  NA 140 135 132* 134*  K 4.0 3.4* 4.1 4.2  CL 99 95* 94* 96*  CO2 29 28 27 26   GLUCOSE 98 165* 141* 177*  BUN 23 33* 37* 35*  CREATININE 1.03* 0.97 1.25* 0.98  CALCIUM 9.4 8.9 9.0 8.9  MG  --  1.8 2.1 2.0  PROT 6.5 6.5  --  6.0*  ALBUMIN 3.6 3.6  --  3.3*  AST 67* 82*  --  96*  ALT 61*  82*  --  121*  ALKPHOS 67 67  --  71  BILITOT 1.0 0.9  --  0.9  GFRNONAA 59* >60 47* >60  ANIONGAP 12 12 11 12     Lipids No results for input(s): CHOL, TRIG, HDL, LABVLDL, LDLCALC, CHOLHDL in the last 168 hours.  Hematology Recent Labs  Lab 10/29/20 0836 10/31/20 0428 11/01/20 0500  WBC 11.2* 8.1 9.3  RBC 3.83* 3.63* 3.88  HGB 13.1 12.1 12.9  HCT 39.0 35.5* 37.5  MCV 101.8* 97.8 96.6  MCH 34.2* 33.3 33.2  MCHC 33.6 34.1 34.4  RDW 13.2 13.0 12.9  PLT 182 166 183   Thyroid No results for input(s): TSH, FREET4 in the last 168 hours.  BNP Recent Labs  Lab 10/26/20 2220 10/29/20 0836  BNP 522.0* 1,282.1*    DDimer No results for input(s): DDIMER in the last 168 hours.   Radiology    No results found.  Cardiac Studies   09/22/2020 1. Left ventricular ejection fraction, by estimation, is 60 to 65%. The  left ventricle has normal function. The left ventricle has no regional  wall motion abnormalities. Left ventricular diastolic parameters are   indeterminate. The average left  ventricular global longitudinal strain is -9.4 %. The global longitudinal  strain is abnormal.   2. Right ventricular systolic function is normal. The right ventricular  size is normal. There is mildly elevated pulmonary artery systolic  pressure. The estimated right ventricular systolic pressure is 38.2 mmHg.   3. The mitral valve is normal in structure. Mild to moderate mitral valve  regurgitation.   4. Tricuspid valve regurgitation is moderate.   5. Left atrial size was mildly dilated.   6. Rhythm is atrial fibrillation, rate 120 bpm    Patient Profile     70 y.o. female with hx of afib, hfpef, copd being seen for afib with rvr  Assessment & Plan    Persistent afib -heart rate better controlled -plan dccv as scheduled -if dccv unsuccesful, consider rate control strategy for now. Largely asymptomatic, heart rates improved. Consider, Outpatient ep input -cont amio, eliquis, lopressor  2. Tobacco use -cessation recommended -nicotine patch  3. Hfpef -cont lasix  Total encounter time 35 minutes  Greater than 50% was spent in counseling and coordination of care with the patient        Signed, 73, MD  11/01/2020, 8:29 AM

## 2020-11-01 NOTE — Procedures (Signed)
Cardioversion procedure note For atrial fibrillation.  Procedure Details:  Consent: Risks of procedure as well as the alternatives and risks of each were explained to the (patient/caregiver).  Consent for procedure obtained.  Time Out: Verified patient identification, verified procedure, site/side was marked, verified correct patient position, special equipment/implants available, medications/allergies/relevent history reviewed, required imaging and test results available.  Performed  Patient placed on cardiac monitor, pulse oximetry, supplemental oxygen as necessary.   Sedation given: propofol IV per anesthesia team  Pacer pads placed anterior and posterior chest.   Cardioverted 1 time(s).   Cardioverted at  200J. Synchronized biphasic Converted to NSR   Evaluation: Findings: Post procedure EKG shows: NSR Complications: None Patient did tolerate procedure well.  After end of procedure, 5-10 mins later, patient did revert to atrial fibrillation with heart rate in the 90s. Since the team had left and case was over, another attempt at cardioversion was not possible.  Recommend continuation of rate control strategy for now Cont amiodarone -consider outpatient EP input regarding rhythm control strategy.  Time Spent Directly with the Patient:  35 minutes   Debbe Odea, M.D.

## 2020-11-01 NOTE — Progress Notes (Signed)
PROGRESS NOTE    Angela Singh   GGY:[id number]  DOB: 24-Mar-1950  PCP: Oswaldo Conroy, MD    DOA: 10/29/2020 LOS: 3    Brief Narrative / Hospital Course to Date:   70 year old female with past medical history of CHF related to difficult to control paroxysmal A. fib with RVR (status post cardioversion on 10/26/2020), hypertension, hyperlipidemia, COPD not on home oxygen, GERD, tobacco abuse who presented to the ED on 10/29/2020 with persistent shortness of breath.  She had been seen in the ED 2 days prior diagnosed with COPD exacerbation, discharged with prednisone and Zithromax.  Her symptoms failed to improve and she was having recurrent heart palpitations.  Return to the ER where she was found to have A. fib with RVR with heart rate into the 130s with associated decompensated heart failure.  Started on Cardizem drip and IV diuresis.  Admitted to [facility] service with cardiology consulted.  Assessment & Plan   Principal Problem:   Acute on chronic systolic CHF (congestive heart failure) (HCC) Active Problems:   Atrial fibrillation with RVR (HCC)   Essential hypertension   Hyperlipidemia   COPD exacerbation (HCC)   Tobacco abuse   Sepsis (HCC)   Abnormal LFTs   Acute on chronic congestive heart failure (HCC)   Acute on chronic systolic CHF -EF 45 to 50% on TEE 10/26/2020.  Presented with pulmonary vascular congestion on chest x-ray and some lower extremity edema, elevated BNP in the setting of A. fib RVR -consistent with decompensation requiring IV diuresis. --Cardiology managing --Treated with IV Lasix, transitioned to oral on 9/27 Plan: --cont oral lasix 40 mg daily --cont metop  Persistent A. fib with RVR -patient has history of difficult to control A. fib.  Recent cardioversion on 10/26/2020 was successful but now with recurrent A. fib. Plan: --cardioversion today, reverted back to Afib shortly after --cont amio gtt --increase Lopressor to 50 mg BID --cont  Cardizem --Continue Eliquis --Prior toxicity with digoxin, attempt to avoid  COPD with acute exacerbation -POA  wheezing on exam and shortness of breath. --transition from IV solumedrol to prednisone 40 mg daily tomorrow --cont Atrovent and Dulera --cont doxy  Hypokalemia  --monitor and replete PRN  Sepsis ruled out -patient does not have any evidence of infection.  Tachycardia and tachypnea secondary to A. fib with RVR and CHF decompensation.  Procalcitonin and lactic acid both negative  Hypertension  --cont metop, cardizem and lasix  Hyperlipidemia  -continue atorvastatin  Tobacco abuse  -patient counseled on importance of cessation.   --cont nicotine patch  Mild transaminitis -present on admission likely due to liver congestion in the setting of CHF decompensation.  Follow LFTs.  Patient BMI: Body mass index is 20.89 kg/m.    DVT prophylaxis: OJ:JKKXFGH Code Status: Full code  Family Communication:  Status is: inpatient Dispo:   The patient is from: home Anticipated d/c is to: home Anticipated d/c date is: 1-2 days Patient currently is not medically stable to d/c due to: symptomatic Afib on amio gtt   Subjective 11/01/20    Pt had cardioversion today which temporarily converted to sinus but then reverted back to Afib.    Pt reported continued unwell feeling with being in Afib, and also DOE.    Consults, Procedures, Significant Events   Consultants:  Cardiology  Procedures:  None  Antimicrobials:  Anti-infectives (From admission, onward)    Start     Dose/Rate Route Frequency Ordered Stop   10/29/20 1130  doxycycline (VIBRA-TABS) tablet 100  mg        100 mg Oral Every 12 hours 10/29/20 1047 11/03/20 0959         Micro    Objective   Vitals:   11/01/20 1334 11/01/20 1607 11/01/20 1932 11/01/20 1936  BP:  102/72  102/65  Pulse:  95  96  Resp:  19  18  Temp:  98.7 F (37.1 C)  98 F (36.7 C)  TempSrc:    Oral  SpO2: 95% 96% 92% 99%   Weight:      Height:        Intake/Output Summary (Last 24 hours) at 11/01/2020 1947 Last data filed at 11/01/2020 1800 Gross per 24 hour  Intake 1645.91 ml  Output 2025 ml  Net -379.09 ml   Filed Weights   10/30/20 1517 10/31/20 0500 11/01/20 0500  Weight: 49.9 kg 52.3 kg 55.2 kg    Physical Exam:  Constitutional: NAD, AAOx3 HEENT: conjunctivae and lids normal, EOMI CV: No cyanosis.   RESP: normal respiratory effort, on RA Extremities: No effusions, edema in BLE SKIN: warm, dry Neuro: II - XII grossly intact.   Psych: depressed mood and affect.  Appropriate judgement and reason   Labs   Data Reviewed: I have personally reviewed following labs and imaging studies  CBC: Recent Labs  Lab 10/26/20 2220 10/29/20 0836 10/31/20 0428 11/01/20 0500  WBC 5.6 11.2* 8.1 9.3  NEUTROABS 3.7 8.8*  --   --   HGB 13.2 13.1 12.1 12.9  HCT 38.9 39.0 35.5* 37.5  MCV 99.7 101.8* 97.8 96.6  PLT 201 182 166 183   Basic Metabolic Panel: Recent Labs  Lab 10/26/20 2220 10/29/20 0836 10/31/20 0428 10/31/20 2000 11/01/20 0500  NA 137 140 135 132* 134*  K 3.6 4.0 3.4* 4.1 4.2  CL 99 99 95* 94* 96*  CO2 28 29 28 27 26   GLUCOSE 145* 98 165* 141* 177*  BUN 20 23 33* 37* 35*  CREATININE 1.03* 1.03* 0.97 1.25* 0.98  CALCIUM 9.6 9.4 8.9 9.0 8.9  MG  --   --  1.8 2.1 2.0   GFR: Estimated Creatinine Clearance: 47.4 mL/min (by C-G formula based on SCr of 0.98 mg/dL). Liver Function Tests: Recent Labs  Lab 10/26/20 2220 10/29/20 0836 10/31/20 0428 11/01/20 0500  AST 85* 67* 82* 96*  ALT 62* 61* 82* 121*  ALKPHOS 64 67 67 71  BILITOT 1.0 1.0 0.9 0.9  PROT 6.3* 6.5 6.5 6.0*  ALBUMIN 3.6 3.6 3.6 3.3*   No results for input(s): LIPASE, AMYLASE in the last 168 hours. No results for input(s): AMMONIA in the last 168 hours. Coagulation Profile: No results for input(s): INR, PROTIME in the last 168 hours. Cardiac Enzymes: No results for input(s): CKTOTAL, CKMB, CKMBINDEX,  TROPONINI in the last 168 hours. BNP (last 3 results) No results for input(s): PROBNP in the last 8760 hours. HbA1C: No results for input(s): HGBA1C in the last 72 hours. CBG: No results for input(s): GLUCAP in the last 168 hours. Lipid Profile: No results for input(s): CHOL, HDL, LDLCALC, TRIG, CHOLHDL, LDLDIRECT in the last 72 hours. Thyroid Function Tests: No results for input(s): TSH, T4TOTAL, FREET4, T3FREE, THYROIDAB in the last 72 hours. Anemia Panel: No results for input(s): VITAMINB12, FOLATE, FERRITIN, TIBC, IRON, RETICCTPCT in the last 72 hours. Sepsis Labs: Recent Labs  Lab 10/29/20 1117 10/30/20 0728  PROCALCITON <0.10  --   LATICACIDVEN 1.9 1.3    Recent Results (from the past 240 hour(s))  SARS  CORONAVIRUS 2 (TAT 6-24 HRS) Nasopharyngeal Nasopharyngeal Swab     Status: None   Collection Time: 10/25/20  9:22 AM   Specimen: Nasopharyngeal Swab  Result Value Ref Range Status   SARS Coronavirus 2 NEGATIVE NEGATIVE Final    Comment: (NOTE) SARS-CoV-2 target nucleic acids are NOT DETECTED.  The SARS-CoV-2 RNA is generally detectable in upper and lower respiratory specimens during the acute phase of infection. Negative results do not preclude SARS-CoV-2 infection, do not rule out co-infections with other pathogens, and should not be used as the sole basis for treatment or other patient management decisions. Negative results must be combined with clinical observations, patient history, and epidemiological information. The expected result is Negative.  Fact Sheet for Patients: HairSlick.no  Fact Sheet for Healthcare Providers: quierodirigir.com  This test is not yet approved or cleared by the [place] FDA and  has been authorized for detection and/or diagnosis of SARS-CoV-2 by FDA under an Emergency Use Authorization (EUA). This EUA will remain  in effect (meaning this test can be used) for the duration  of the COVID-19 declaration under Se ction 564(b)(1) of the Act, 21 U.S.C. section 360bbb-3(b)(1), unless the authorization is terminated or revoked sooner.  Performed at [facility], [address], [place], [place] [address]   Resp Panel by RT-PCR (Flu A&B, Covid) Nasopharyngeal Swab     Status: None   Collection Time: 10/29/20  8:54 AM   Specimen: Nasopharyngeal Swab; Nasopharyngeal(NP) swabs in vial transport medium  Result Value Ref Range Status   SARS Coronavirus 2 by RT PCR NEGATIVE NEGATIVE Final    Comment: (NOTE) SARS-CoV-2 target nucleic acids are NOT DETECTED.  The SARS-CoV-2 RNA is generally detectable in upper respiratory specimens during the acute phase of infection. The lowest concentration of SARS-CoV-2 viral copies this assay can detect is 138 copies/mL. A negative result does not preclude SARS-Cov-2 infection and should not be used as the sole basis for treatment or other patient management decisions. A negative result may occur with  improper specimen collection/handling, submission of specimen other than nasopharyngeal swab, presence of viral mutation(s) within the areas targeted by this assay, and inadequate number of viral copies(<138 copies/mL). A negative result must be combined with clinical observations, patient history, and epidemiological information. The expected result is Negative.  Fact Sheet for Patients:  BloggerCourse.com  Fact Sheet for Healthcare Providers:  SeriousBroker.it  This test is no t yet approved or cleared by the [place] FDA and  has been authorized for detection and/or diagnosis of SARS-CoV-2 by FDA under an Emergency Use Authorization (EUA). This EUA will remain  in effect (meaning this test can be used) for the duration of the COVID-19 declaration under Section 564(b)(1) of the Act, 21 U.S.C.section 360bbb-3(b)(1), unless the authorization is terminated  or revoked  sooner.       Influenza A by PCR NEGATIVE NEGATIVE Final   Influenza B by PCR NEGATIVE NEGATIVE Final    Comment: (NOTE) The Xpert Xpress SARS-CoV-2/FLU/RSV plus assay is intended as an aid in the diagnosis of influenza from Nasopharyngeal swab specimens and should not be used as a sole basis for treatment. Nasal washings and aspirates are unacceptable for Xpert Xpress SARS-CoV-2/FLU/RSV testing.  Fact Sheet for Patients: BloggerCourse.com  Fact Sheet for Healthcare Providers: SeriousBroker.it  This test is not yet approved or cleared by the [place] FDA and has been authorized for detection and/or diagnosis of SARS-CoV-2 by FDA under an Emergency Use Authorization (EUA). This EUA will  remain in effect (meaning this test can be used) for the duration of the COVID-19 declaration under Section 564(b)(1) of the Act, 21 U.S.C. section 360bbb-3(b)(1), unless the authorization is terminated or revoked.  Performed at [facility], [address] [place], [place] [address]       Imaging Studies   No results found.   Medications   Scheduled Meds:  apixaban  5 mg Oral BID   ascorbic acid  1,000 mg Oral BID   atorvastatin  20 mg Oral QHS   diltiazem  180 mg Oral Daily   doxycycline  100 mg Oral Q12H   FLUoxetine  40 mg Oral Daily   furosemide  40 mg Oral Daily   ipratropium  0.5 mg Nebulization Q6H   melatonin  5 mg Oral QHS   methylPREDNISolone (SOLU-MEDROL) injection  40 mg Intravenous Q24H   metoprolol tartrate  50 mg Oral BID   mometasone-formoterol  2 puff Inhalation BID   multivitamin with minerals  1 tablet Oral Daily   nicotine  21 mg Transdermal Daily   pantoprazole  40 mg Oral Daily   potassium chloride  40 mEq Oral BID   vitamin E  400 Units Oral BID   Continuous Infusions:  sodium chloride Stopped (11/01/20 1652)   amiodarone 30 mg/hr (11/01/20 1518)       LOS: 3 days     Darlin Priestly,  md [facility]  11/01/2020, 7:47 PM

## 2020-11-01 NOTE — Progress Notes (Signed)
Increased Lopressor to 50 mg bid per MD request.

## 2020-11-01 NOTE — Anesthesia Postprocedure Evaluation (Signed)
Anesthesia Post Note  Patient: Angela Singh  Procedure(s) Performed: CARDIOVERSION  Patient location during evaluation: Specials Recovery (specials recovery) Anesthesia Type: General Level of consciousness: awake and alert and oriented Pain management: pain level controlled Vital Signs Assessment: post-procedure vital signs reviewed and stable Respiratory status: spontaneous breathing, nonlabored ventilation and respiratory function stable Cardiovascular status: blood pressure returned to baseline and stable Postop Assessment: no signs of nausea or vomiting Anesthetic complications: no   No notable events documented.   Last Vitals:  Vitals:   11/01/20 0900 11/01/20 0937  BP: 114/84 112/75  Pulse: 91 (!) 101  Resp: 15 16  Temp:  36.8 C  SpO2: 97% 97%    Last Pain:  Vitals:   11/01/20 0937  TempSrc: Oral  PainSc:                  [name]

## 2020-11-01 NOTE — Anesthesia Preprocedure Evaluation (Signed)
Anesthesia Evaluation  Patient identified by MRN, date of birth, ID band Patient awake    Reviewed: Allergy & Precautions, NPO status , Patient's Chart, lab work & pertinent test results  History of Anesthesia Complications Negative for: history of anesthetic complications  Airway Mallampati: II  TM Distance: >3 FB Neck ROM: Full    Dental  (+) Poor Dentition, Missing   Pulmonary COPD, Current Smoker and Patient abstained from smoking.,    breath sounds clear to auscultation- rhonchi (-) wheezing      Cardiovascular hypertension, Pt. on medications +CHF  (-) CAD, (-) Past MI, (-) Cardiac Stents and (-) CABG + dysrhythmias Atrial Fibrillation  Rhythm:Regular Rate:Normal - Systolic murmurs and - Diastolic murmurs    Neuro/Psych neg Seizures PSYCHIATRIC DISORDERS Depression negative neurological ROS     GI/Hepatic Neg liver ROS, GERD  ,  Endo/Other  negative endocrine ROSneg diabetes  Renal/GU negative Renal ROS     Musculoskeletal negative musculoskeletal ROS (+)   Abdominal (+) - obese,   Peds  Hematology negative hematology ROS (+)   Anesthesia Other Findings Past Medical History: No date: COPD (chronic obstructive pulmonary disease) (HCC) No date: Essential hypertension No date: GERD (gastroesophageal reflux disease) No date: History of echocardiogram     Comment:  a. 09/2020 Echo: Ef 60-65%. No rwma. Nl RV fxn. RVSP               38.14mmHg. Mild to mod MR. Mod TR. No date: Mixed hyperlipidemia No date: Persistent atrial fibrillation (HCC)     Comment:  a. Dx 09/2020. CHA2DS2VASc = 3-->Eliquis. No date: Tobacco abuse   Reproductive/Obstetrics                             Anesthesia Physical Anesthesia Plan  ASA: 3  Anesthesia Plan: General   Post-op Pain Management:    Induction: Intravenous  PONV Risk Score and Plan: 1 and Propofol infusion  Airway Management Planned:  Natural Airway  Additional Equipment:   Intra-op Plan:   Post-operative Plan:   Informed Consent: I have reviewed the patients History and Physical, chart, labs and discussed the procedure including the risks, benefits and alternatives for the proposed anesthesia with the patient or authorized representative who has indicated his/her understanding and acceptance.     Dental advisory given  Plan Discussed with: CRNA and Anesthesiologist  Anesthesia Plan Comments:         Anesthesia Quick Evaluation

## 2020-11-01 NOTE — Progress Notes (Signed)
Received report on patient from Onalee Hua, RN who stated patient was in NSR, no 12 lead ekg in chart.  Released orders for procedure and called to get NT to obtain ordered 12 lead EKG   notified dr. Azucena Cecil of the above.  This nurse arrived in the patient's room while NT obtaining EKG and appears to be Afib.  Notified dr. Azucena Cecil of the same and proceeded to bring patient to Special Procedures for cardioversion, patient in agreement to proceed with procedures, understands she is still in afib and not NSR.

## 2020-11-01 NOTE — Transfer of Care (Signed)
Immediate Anesthesia Transfer of Care Note  Patient: Angela Singh  Procedure(s) Performed: CARDIOVERSION  Patient Location: PACU  Anesthesia Type:General  Level of Consciousness: sedated  Airway & Oxygen Therapy: Patient Spontanous Breathing and Patient connected to nasal cannula oxygen  Post-op Assessment: Report given to RN and Post -op Vital signs reviewed and stable  Post vital signs: Reviewed and stable  Last Vitals:  Vitals Value Taken Time  BP 97/61   Temp    Pulse 57 11/01/20 0825  Resp 12 11/01/20 0825  SpO2 94 % 11/01/20 0825    Last Pain:  Vitals:   11/01/20 0805  TempSrc: Oral  PainSc: 0-No pain      Patients Stated Pain Goal: 0 (10/30/20 2009)  Complications: No notable events documented.

## 2020-11-02 ENCOUNTER — Inpatient Hospital Stay: Payer: Medicare HMO

## 2020-11-02 DIAGNOSIS — Z720 Tobacco use: Secondary | ICD-10-CM | POA: Diagnosis not present

## 2020-11-02 DIAGNOSIS — R0603 Acute respiratory distress: Secondary | ICD-10-CM

## 2020-11-02 DIAGNOSIS — I5023 Acute on chronic systolic (congestive) heart failure: Secondary | ICD-10-CM | POA: Diagnosis not present

## 2020-11-02 DIAGNOSIS — I5033 Acute on chronic diastolic (congestive) heart failure: Secondary | ICD-10-CM

## 2020-11-02 DIAGNOSIS — J441 Chronic obstructive pulmonary disease with (acute) exacerbation: Secondary | ICD-10-CM | POA: Diagnosis not present

## 2020-11-02 MED ORDER — AMIODARONE HCL 200 MG PO TABS
400.0000 mg | ORAL_TABLET | Freq: Two times a day (BID) | ORAL | Status: DC
  Administered 2020-11-02 – 2020-11-03 (×3): 400 mg via ORAL
  Filled 2020-11-02 (×3): qty 2

## 2020-11-02 MED ORDER — FUROSEMIDE 10 MG/ML IJ SOLN
40.0000 mg | Freq: Once | INTRAMUSCULAR | Status: AC
  Administered 2020-11-02: 40 mg via INTRAVENOUS
  Filled 2020-11-02: qty 4

## 2020-11-02 MED ORDER — POLYETHYLENE GLYCOL 3350 17 G PO PACK
34.0000 g | PACK | ORAL | Status: AC
  Administered 2020-11-02 (×4): 34 g via ORAL
  Filled 2020-11-02 (×4): qty 2

## 2020-11-02 NOTE — Care Management Important Message (Signed)
Important Message  Patient Details  Name: Angela Singh MRN: [id number] Date of Birth: 10-15-1950   Medicare Important Message Given:  Yes     [name] 11/02/2020, 4:17 PM

## 2020-11-02 NOTE — Progress Notes (Signed)
PROGRESS NOTE    Angela Singh   WUJ:[id number]  DOB: 10-27-1950  PCP: Oswaldo Conroy, MD    DOA: 10/29/2020 LOS: 4    Brief Narrative / Hospital Course to Date:   70 year old female with past medical history of CHF related to difficult to control paroxysmal A. fib with RVR (status post cardioversion on 10/26/2020), hypertension, hyperlipidemia, COPD not on home oxygen, GERD, tobacco abuse who presented to the ED on 10/29/2020 with persistent shortness of breath.  She had been seen in the ED 2 days prior diagnosed with COPD exacerbation, discharged with prednisone and Zithromax.  Her symptoms failed to improve and she was having recurrent heart palpitations.  Return to the ER where she was found to have A. fib with RVR with heart rate into the 130s with associated decompensated heart failure.  Started on Cardizem drip and IV diuresis.  Admitted to [facility] service with cardiology consulted.  Assessment & Plan   Principal Problem:   Acute on chronic systolic CHF (congestive heart failure) (HCC) Active Problems:   Atrial fibrillation with RVR (HCC)   Essential hypertension   Hyperlipidemia   COPD exacerbation (HCC)   Tobacco abuse   Sepsis (HCC)   Abnormal LFTs   Acute on chronic congestive heart failure (HCC)   Acute on chronic diastolic CHF  -EF 45 to 50% on TEE 10/26/2020.  Presented with pulmonary vascular congestion on chest x-ray and some lower extremity edema, elevated BNP in the setting of A. fib RVR -consistent with decompensation requiring IV diuresis. --Cardiology managing --Treated with IV Lasix, transitioned to oral on 9/27 Plan: --CXR today, showed persistent pulm edema and new small bilateral pleural effusion --IV lasix 40 mg BID today --cont metop  Persistent A. fib with RVR  -patient has history of difficult to control A. fib.  Recent cardioversion on 10/26/2020 was successful but now with recurrent A. fib. --started on amio gtt --cardioversion 9/28, reverted  back to Afib shortly after Plan: --switch to oral amio 400 mg BID --cont Lopressor 50 mg BID --cont Cardizem --Continue Eliquis --Prior toxicity with digoxin, attempt to avoid  COPD with acute exacerbation -POA  wheezing on exam and shortness of breath. --started on IV solumedrol  --prednisone 40 mg daily --cont Atrovent and Dulera --cont doxy  Hypokalemia  --monitor and replete PRN  Sepsis ruled out -patient does not have any evidence of infection.  Tachycardia and tachypnea secondary to A. fib with RVR and CHF decompensation.  Procalcitonin and lactic acid both negative  Hypertension  --cont metop, cardizem  --IV lasix 40 mg BID today  Hyperlipidemia  -continue atorvastatin  Tobacco abuse  -patient counseled on importance of cessation.   --cont nicotine patch  Mild transaminitis -present on admission likely due to liver congestion in the setting of CHF decompensation.  Follow LFTs.  Patient BMI: Body mass index is 20.89 kg/m.    DVT prophylaxis: NF:AOZHYQM Code Status: Full code  Family Communication:  Status is: inpatient Dispo:   The patient is from: home Anticipated d/c is to: home Anticipated d/c date is: tomorrow Patient currently is not medically stable to d/c due to: IV lasix today for pulm edema   Subjective 11/02/20    Due to dyspnea, CXR obtained this morning, which showed similar pulm edema and new small pleural effusion.  IV lasix 40 ordered.  Pt reported feeling better after IV lasix.   Consults, Procedures, Significant Events   Consultants:  Cardiology  Procedures:  None  Antimicrobials:  Anti-infectives (From  admission, onward)    Start     Dose/Rate Route Frequency Ordered Stop   10/29/20 1130  doxycycline (VIBRA-TABS) tablet 100 mg        100 mg Oral Every 12 hours 10/29/20 1047 11/03/20 0959         Micro    Objective   Vitals:   11/02/20 0754 11/02/20 0821 11/02/20 1153 11/02/20 1521  BP: 121/78  120/76 101/61   Pulse: 98  82 (!) 102  Resp: 19  18 18   Temp: 98.2 F (36.8 C)  98.1 F (36.7 C) 98.5 F (36.9 C)  TempSrc:      SpO2: 94% 95% 93% 98%  Weight:      Height:        Intake/Output Summary (Last 24 hours) at 11/02/2020 1628 Last data filed at 11/02/2020 1500 Gross per 24 hour  Intake 1839.17 ml  Output 525 ml  Net 1314.17 ml   Filed Weights   10/30/20 1517 10/31/20 0500 11/01/20 0500  Weight: 49.9 kg 52.3 kg 55.2 kg    Physical Exam:  Constitutional: NAD, AAOx3 HEENT: conjunctivae and lids normal, EOMI CV: No cyanosis.   RESP: normal respiratory effort, on RA Extremities: No effusions, edema in BLE SKIN: warm, dry Neuro: II - XII grossly intact.   Psych: depressed mood and affect.  Appropriate judgement and reason   Labs   Data Reviewed: I have personally reviewed following labs and imaging studies  CBC: Recent Labs  Lab 10/26/20 2220 10/29/20 0836 10/31/20 0428 11/01/20 0500  WBC 5.6 11.2* 8.1 9.3  NEUTROABS 3.7 8.8*  --   --   HGB 13.2 13.1 12.1 12.9  HCT 38.9 39.0 35.5* 37.5  MCV 99.7 101.8* 97.8 96.6  PLT 201 182 166 183   Basic Metabolic Panel: Recent Labs  Lab 10/26/20 2220 10/29/20 0836 10/31/20 0428 10/31/20 2000 11/01/20 0500  NA 137 140 135 132* 134*  K 3.6 4.0 3.4* 4.1 4.2  CL 99 99 95* 94* 96*  CO2 28 29 28 27 26   GLUCOSE 145* 98 165* 141* 177*  BUN 20 23 33* 37* 35*  CREATININE 1.03* 1.03* 0.97 1.25* 0.98  CALCIUM 9.6 9.4 8.9 9.0 8.9  MG  --   --  1.8 2.1 2.0   GFR: Estimated Creatinine Clearance: 47.4 mL/min (by C-G formula based on SCr of 0.98 mg/dL). Liver Function Tests: Recent Labs  Lab 10/26/20 2220 10/29/20 0836 10/31/20 0428 11/01/20 0500  AST 85* 67* 82* 96*  ALT 62* 61* 82* 121*  ALKPHOS 64 67 67 71  BILITOT 1.0 1.0 0.9 0.9  PROT 6.3* 6.5 6.5 6.0*  ALBUMIN 3.6 3.6 3.6 3.3*   No results for input(s): LIPASE, AMYLASE in the last 168 hours. No results for input(s): AMMONIA in the last 168 hours. Coagulation  Profile: No results for input(s): INR, PROTIME in the last 168 hours. Cardiac Enzymes: No results for input(s): CKTOTAL, CKMB, CKMBINDEX, TROPONINI in the last 168 hours. BNP (last 3 results) No results for input(s): PROBNP in the last 8760 hours. HbA1C: No results for input(s): HGBA1C in the last 72 hours. CBG: No results for input(s): GLUCAP in the last 168 hours. Lipid Profile: No results for input(s): CHOL, HDL, LDLCALC, TRIG, CHOLHDL, LDLDIRECT in the last 72 hours. Thyroid Function Tests: No results for input(s): TSH, T4TOTAL, FREET4, T3FREE, THYROIDAB in the last 72 hours. Anemia Panel: No results for input(s): VITAMINB12, FOLATE, FERRITIN, TIBC, IRON, RETICCTPCT in the last 72 hours. Sepsis Labs: Recent Labs  Lab 10/29/20 1117 10/30/20 0728  PROCALCITON <0.10  --   LATICACIDVEN 1.9 1.3    Recent Results (from the past 240 hour(s))  SARS CORONAVIRUS 2 (TAT 6-24 HRS) Nasopharyngeal Nasopharyngeal Swab     Status: None   Collection Time: 10/25/20  9:22 AM   Specimen: Nasopharyngeal Swab  Result Value Ref Range Status   SARS Coronavirus 2 NEGATIVE NEGATIVE Final    Comment: (NOTE) SARS-CoV-2 target nucleic acids are NOT DETECTED.  The SARS-CoV-2 RNA is generally detectable in upper and lower respiratory specimens during the acute phase of infection. Negative results do not preclude SARS-CoV-2 infection, do not rule out co-infections with other pathogens, and should not be used as the sole basis for treatment or other patient management decisions. Negative results must be combined with clinical observations, patient history, and epidemiological information. The expected result is Negative.  Fact Sheet for Patients: HairSlick.no  Fact Sheet for Healthcare Providers: quierodirigir.com  This test is not yet approved or cleared by the [place] FDA and  has been authorized for detection and/or diagnosis of  SARS-CoV-2 by FDA under an Emergency Use Authorization (EUA). This EUA will remain  in effect (meaning this test can be used) for the duration of the COVID-19 declaration under Se ction 564(b)(1) of the Act, 21 U.S.C. section 360bbb-3(b)(1), unless the authorization is terminated or revoked sooner.  Performed at [facility], [address] [place], [place] [address]   Resp Panel by RT-PCR (Flu A&B, Covid) Nasopharyngeal Swab     Status: None   Collection Time: 10/29/20  8:54 AM   Specimen: Nasopharyngeal Swab; Nasopharyngeal(NP) swabs in vial transport medium  Result Value Ref Range Status   SARS Coronavirus 2 by RT PCR NEGATIVE NEGATIVE Final    Comment: (NOTE) SARS-CoV-2 target nucleic acids are NOT DETECTED.  The SARS-CoV-2 RNA is generally detectable in upper respiratory specimens during the acute phase of infection. The lowest concentration of SARS-CoV-2 viral copies this assay can detect is 138 copies/mL. A negative result does not preclude SARS-Cov-2 infection and should not be used as the sole basis for treatment or other patient management decisions. A negative result may occur with  improper specimen collection/handling, submission of specimen other than nasopharyngeal swab, presence of viral mutation(s) within the areas targeted by this assay, and inadequate number of viral copies(<138 copies/mL). A negative result must be combined with clinical observations, patient history, and epidemiological information. The expected result is Negative.  Fact Sheet for Patients:  BloggerCourse.com  Fact Sheet for Healthcare Providers:  SeriousBroker.it  This test is no t yet approved or cleared by the [place] FDA and  has been authorized for detection and/or diagnosis of SARS-CoV-2 by FDA under an Emergency Use Authorization (EUA). This EUA will remain  in effect (meaning this test can be used) for the duration of  the COVID-19 declaration under Section 564(b)(1) of the Act, 21 U.S.C.section 360bbb-3(b)(1), unless the authorization is terminated  or revoked sooner.       Influenza A by PCR NEGATIVE NEGATIVE Final   Influenza B by PCR NEGATIVE NEGATIVE Final    Comment: (NOTE) The Xpert Xpress SARS-CoV-2/FLU/RSV plus assay is intended as an aid in the diagnosis of influenza from Nasopharyngeal swab specimens and should not be used as a sole basis for treatment. Nasal washings and aspirates are unacceptable for Xpert Xpress SARS-CoV-2/FLU/RSV testing.  Fact Sheet for Patients: BloggerCourse.com  Fact Sheet for Healthcare Providers: SeriousBroker.it  This test is not yet approved or  cleared by the [place] and has been authorized for detection and/or diagnosis of SARS-CoV-2 by FDA under an Emergency Use Authorization (EUA). This EUA will remain in effect (meaning this test can be used) for the duration of the COVID-19 declaration under Section 564(b)(1) of the Act, 21 U.S.C. section 360bbb-3(b)(1), unless the authorization is terminated or revoked.  Performed at [facility], [address], [place], [place] [address]       Imaging Studies   DG Chest [place] 1 View  Result Date: 11/02/2020 CLINICAL DATA:  Shortness of breath EXAM: PORTABLE CHEST 1 VIEW COMPARISON:  10/29/2020 FINDINGS: Heart is upper limits normal in size. Diffuse interstitial prominence throughout the lungs with mild vascular congestion. Suspect small bilateral effusions. Bibasilar atelectasis. IMPRESSION: Diffuse interstitial prominence could reflect interstitial edema or chronic lung disease. Small bilateral effusions with bibasilar atelectasis. Electronically Signed   By: Charlett Nose M.D.   On: 11/02/2020 10:07     Medications   Scheduled Meds:  amiodarone  400 mg Oral BID   apixaban  5 mg Oral BID   ascorbic acid  1,000 mg Oral BID    atorvastatin  20 mg Oral QHS   diltiazem  180 mg Oral Daily   doxycycline  100 mg Oral Q12H   FLUoxetine  40 mg Oral Daily   furosemide  40 mg Oral Daily   ipratropium  0.5 mg Nebulization Q12H   melatonin  5 mg Oral QHS   metoprolol tartrate  50 mg Oral BID   mometasone-formoterol  2 puff Inhalation BID   multivitamin with minerals  1 tablet Oral Daily   nicotine  21 mg Transdermal Daily   pantoprazole  40 mg Oral Daily   polyethylene glycol  34 g Oral Q2H   potassium chloride  40 mEq Oral BID   predniSONE  40 mg Oral Q breakfast   vitamin E  400 Units Oral BID   Continuous Infusions:  sodium chloride Stopped (11/01/20 1652)       LOS: 4 days     Darlin Priestly, md [facility]  11/02/2020, 4:28 PM

## 2020-11-02 NOTE — Progress Notes (Signed)
Progress Note  Patient Name: Angela Singh Date of Encounter: 11/02/2020  Primary Cardiologist: Julien Nordmann, MD   Subjective   Informed pt she not hold NSR after DCCV. Case discussed with MD.  Will plan for amiodarone load and diuresis as discussed with pt.  No chest pain, some DOE with ambulation to restroom while I was in the room.   No tachypalpitations. Reviewed that we will need OP follow-up and someone from our office will call her.  Inpatient Medications    Scheduled Meds:  amiodarone  400 mg Oral BID   apixaban  5 mg Oral BID   ascorbic acid  1,000 mg Oral BID   atorvastatin  20 mg Oral QHS   diltiazem  180 mg Oral Daily   doxycycline  100 mg Oral Q12H   FLUoxetine  40 mg Oral Daily   furosemide  40 mg Oral Daily   ipratropium  0.5 mg Nebulization Q12H   melatonin  5 mg Oral QHS   metoprolol tartrate  50 mg Oral BID   mometasone-formoterol  2 puff Inhalation BID   multivitamin with minerals  1 tablet Oral Daily   nicotine  21 mg Transdermal Daily   pantoprazole  40 mg Oral Daily   polyethylene glycol  34 g Oral Q2H   potassium chloride  40 mEq Oral BID   predniSONE  40 mg Oral Q breakfast   vitamin E  400 Units Oral BID   Continuous Infusions:  sodium chloride Stopped (11/01/20 1652)   PRN Meds: acetaminophen, dextromethorphan-guaiFENesin, hydrALAZINE, levalbuterol, ondansetron (ZOFRAN) IV, traZODone   Vital Signs    Vitals:   11/01/20 1936 11/01/20 2331 11/02/20 0754 11/02/20 0821  BP: 102/65 102/70 121/78   Pulse: 96 98 98   Resp: 18 18 19    Temp: 98 F (36.7 C) 98.2 F (36.8 C) 98.2 F (36.8 C)   TempSrc: Oral Oral    SpO2: 99% 94% 94% 95%  Weight:      Height:        Intake/Output Summary (Last 24 hours) at 11/02/2020 1030 Last data filed at 11/02/2020 0950 Gross per 24 hour  Intake 2292.6 ml  Output 2025 ml  Net 267.6 ml   Last 3 Weights 11/01/2020 10/31/2020 10/30/2020  Weight (lbs) 121 lb 11.1 oz 115 lb 6.4 oz 110 lb  Weight  (kg) 55.2 kg 52.345 kg 49.896 kg      Telemetry    Afib (did not hold DCCV) with rates in the 90s - Personally Reviewed  ECG    No new tracings - Personally Reviewed  Physical Exam   GEN: No acute distress.   Neck: No JVD Cardiac: IRIR, no murmurs, rubs, or gallops.  Respiratory: Reduced bibasilar breath sounds. GI: Soft, nontender, non-distended  MS: No edema; No deformity. Neuro:  Nonfocal  Psych: Normal affect   Labs    High Sensitivity Troponin:   Recent Labs  Lab 10/12/20 1820 10/12/20 2117 10/26/20 2220 10/27/20 0027 10/29/20 0836  TROPONINIHS 26* 25* 19* 16 17      Chemistry Recent Labs  Lab 10/29/20 0836 10/31/20 0428 10/31/20 2000 11/01/20 0500  NA 140 135 132* 134*  K 4.0 3.4* 4.1 4.2  CL 99 95* 94* 96*  CO2 29 28 27 26   GLUCOSE 98 165* 141* 177*  BUN 23 33* 37* 35*  CREATININE 1.03* 0.97 1.25* 0.98  CALCIUM 9.4 8.9 9.0 8.9  PROT 6.5 6.5  --  6.0*  ALBUMIN 3.6 3.6  --  3.3*  AST 67* 82*  --  96*  ALT 61* 82*  --  121*  ALKPHOS 67 67  --  71  BILITOT 1.0 0.9  --  0.9  GFRNONAA 59* >60 47* >60  ANIONGAP 12 12 11 12      Hematology Recent Labs  Lab 10/29/20 0836 10/31/20 0428 11/01/20 0500  WBC 11.2* 8.1 9.3  RBC 3.83* 3.63* 3.88  HGB 13.1 12.1 12.9  HCT 39.0 35.5* 37.5  MCV 101.8* 97.8 96.6  MCH 34.2* 33.3 33.2  MCHC 33.6 34.1 34.4  RDW 13.2 13.0 12.9  PLT 182 166 183    BNP Recent Labs  Lab 10/26/20 2220 10/29/20 0836  BNP 522.0* 1,282.1*     DDimer No results for input(s): DDIMER in the last 168 hours.   Radiology    DG Chest [place] 1 View  Result Date: 11/02/2020 CLINICAL DATA:  Shortness of breath EXAM: PORTABLE CHEST 1 VIEW COMPARISON:  10/29/2020 FINDINGS: Heart is upper limits normal in size. Diffuse interstitial prominence throughout the lungs with mild vascular congestion. Suspect small bilateral effusions. Bibasilar atelectasis. IMPRESSION: Diffuse interstitial prominence could reflect interstitial edema or  chronic lung disease. Small bilateral effusions with bibasilar atelectasis. Electronically Signed   By: 10/31/2020 M.D.   On: 11/02/2020 10:07    Cardiac Studies   09/22/2020 1. Left ventricular ejection fraction, by estimation, is 60 to 65%. The  left ventricle has normal function. The left ventricle has no regional  wall motion abnormalities. Left ventricular diastolic parameters are  indeterminate. The average left  ventricular global longitudinal strain is -9.4 %. The global longitudinal  strain is abnormal.   2. Right ventricular systolic function is normal. The right ventricular  size is normal. There is mildly elevated pulmonary artery systolic  pressure. The estimated right ventricular systolic pressure is 38.2 mmHg.   3. The mitral valve is normal in structure. Mild to moderate mitral valve  regurgitation.   4. Tricuspid valve regurgitation is moderate.   5. Left atrial size was mildly dilated.   6. Rhythm is atrial fibrillation, rate 120 bpm   TEE 09/25/2020: 1. Left ventricular ejection fraction, by estimation, is 55 to 60%. The  left ventricle has normal function. The left ventricle has no regional  wall motion abnormalities.   2. Right ventricular systolic function is normal. The right ventricular  size is normal. There is normal pulmonary artery systolic pressure.   3. Left atrial size was mildly dilated. 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 Small mobile thrombus in mid LAA   4. Right atrial size was mildly dilated.   5. The mitral valve is normal in structure. Mild mitral valve  regurgitation. No evidence of mitral stenosis.   6. Tricuspid valve regurgitation is mild to moderate.   7. The aortic valve is tricuspid. Aortic valve regurgitation is not  visualized. No aortic stenosis is present.   8. There is Moderate (Grade III) plaque involving the descending aorta.   9. Agitated saline contrast bubble study was negative, with no evidence  of  any interatrial shunt.  __________   2D echo 09/22/2020: 1. Left ventricular ejection fraction, by estimation, is 60 to 65%. The  left ventricle has normal function. The left ventricle has no regional  wall motion abnormalities. Left ventricular diastolic parameters are  indeterminate. The average left  ventricular global longitudinal strain is -9.4 %. The global longitudinal  strain is abnormal.   2. Right ventricular systolic function is normal.  The right ventricular  size is normal. There is mildly elevated pulmonary artery systolic  pressure. The estimated right ventricular systolic pressure is 38.2 mmHg.   3. The mitral valve is normal in structure. Mild to moderate mitral valve  regurgitation.   4. Tricuspid valve regurgitation is moderate.   5. Left atrial size was mildly dilated.   Patient Profile     70 y.o. female with hx of Afib s/p DCCV 11/01/20, HFpEF, HTN, HLD, COPD, tobacco use, and GERD, and seen of Afib and AOC HFpEF.   Assessment & Plan    Afib s/p DCCV --No significant tachypalpitations.  Briefly held DCCV 10/26/2020 and 11/01/20.  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  Informed pt that she did not hold DCCV. IV amiodarone will be transitioned to oral amiodarone 400 mg twice daily with follow-up in the office to monitor amiodarone labs and EKG.  Continue Lopressor and diltiazem for rate control.  Avoid digoxin given elevated prior digoxin level.  Continue anticoagulation with Eliquis and CHA2DS2-VASc score of at least 4 (CHF, hypertension, age, female).  Will need follow-up scheduled in the office before discharge.  Acute on chronic combined systolic and diastolic CHF --Reports dyspnea with ambulation.  Shortness of breath likely multifactorial in the setting of ongoing A. fib and COPD.  Also still volume up on exam, likely exacerbated by atrial fibrillation.  Continue oral lasix and metoprolol.  Also received IV lasix x1 today for  ongoing overload. Continue to monitor I/os, daily standing weights, daily BMET.  Maintain electrolytes at goal.  Essential hypertension -- Continue medical management as above.  HLD --Continue statin.  For questions or updates, please contact [facility] Please consult [email or web address] for contact info under        Signed, Lennon Alstrom, PA-C  11/02/2020, 10:30 AM

## 2020-11-03 LAB — BASIC METABOLIC PANEL
Anion gap: 6 (ref 5–15)
BUN: 30 mg/dL — ABNORMAL HIGH (ref 8–23)
CO2: 33 mmol/L — ABNORMAL HIGH (ref 22–32)
Calcium: 8.9 mg/dL (ref 8.9–10.3)
Chloride: 101 mmol/L (ref 98–111)
Creatinine, Ser: 0.89 mg/dL (ref 0.44–1.00)
GFR, Estimated: >60 mL/min (ref >60)
Glucose, Bld: 107 mg/dL — ABNORMAL HIGH (ref 70–99)
Potassium: 5 mmol/L (ref 3.5–5.1)
Sodium: 140 mmol/L (ref 135–145)

## 2020-11-03 LAB — MAGNESIUM: Magnesium: 1.9 mg/dL (ref 1.7–2.4)

## 2020-11-03 LAB — CBC
HCT: 40.5 % (ref 36.0–46.0)
Hemoglobin: 14 g/dL (ref 12.0–15.0)
MCH: 34 pg (ref 26.0–34.0)
MCHC: 34.6 g/dL (ref 30.0–36.0)
MCV: 98.3 fL (ref 80.0–100.0)
Platelets: 189 10*3/uL (ref 150–400)
RBC: 4.12 MIL/uL (ref 3.87–5.11)
RDW: 13.2 % (ref 11.5–15.5)
WBC: 9.6 10*3/uL (ref 4.0–10.5)
nRBC: 0 % (ref 0.0–0.2)

## 2020-11-03 MED ORDER — AMIODARONE HCL 400 MG PO TABS: 400.0000 mg | ORAL_TABLET | Freq: Two times a day (BID) | ORAL | 0 refills | Status: DC

## 2020-11-03 MED ORDER — FUROSEMIDE 10 MG/ML IJ SOLN
40.0000 mg | Freq: Once | INTRAMUSCULAR | Status: AC
  Administered 2020-11-03: 40 mg via INTRAVENOUS
  Filled 2020-11-03: qty 4

## 2020-11-03 MED ORDER — METOPROLOL TARTRATE 50 MG PO TABS: 50.0000 mg | ORAL_TABLET | Freq: Two times a day (BID) | ORAL | 0 refills | Status: DC

## 2020-11-03 MED ORDER — FUROSEMIDE 40 MG PO TABS: 40.0000 mg | ORAL_TABLET | Freq: Two times a day (BID) | ORAL | 0 refills | Status: DC

## 2020-11-03 MED ORDER — POTASSIUM CHLORIDE CRYS ER 20 MEQ PO TBCR: 40.0000 meq | EXTENDED_RELEASE_TABLET | Freq: Every day | ORAL | Status: DC

## 2020-11-03 MED ORDER — APIXABAN 5 MG PO TABS: 5.0000 mg | ORAL_TABLET | Freq: Two times a day (BID) | ORAL | 0 refills | Status: DC

## 2020-11-03 MED ORDER — FUROSEMIDE 40 MG PO TABS: 40.0000 mg | ORAL_TABLET | Freq: Two times a day (BID) | ORAL | Status: DC

## 2020-11-03 MED ORDER — NICOTINE 21 MG/24HR TD PT24: 21.0000 mg | MEDICATED_PATCH | Freq: Every day | TRANSDERMAL | 0 refills | Status: DC

## 2020-11-03 NOTE — H&P (View-Only) (Signed)
Progress Note  Patient Name: Angela Singh Date of Encounter: 11/03/2020  Primary Cardiologist: Julien Nordmann, MD   Subjective   No chest pain.  No shortness of breath at rest.  Reports previous dyspnea with ambulation to the restroom has now resolved.    Reports improvement is associated with recent increase in regimen of diuresis.  She is s/p oral Lasix 40 mg daily with additional IV Lasix 40 mg x 2 yesterday, as well as an additional IV Lasix 40 mg x 1 this morning.    Discussed that, as she has been holding onto more fluid in while atrial fibrillation, we will increase her PTA home diuresis to Lasix 40 mg twice daily with recommendation she take the second dose no later than 1 PM and also reviewed that we will increase her potassium to 40 M EQ daily.  Inpatient Medications    Scheduled Meds:  amiodarone  400 mg Oral BID   apixaban  5 mg Oral BID   ascorbic acid  1,000 mg Oral BID   atorvastatin  20 mg Oral QHS   diltiazem  180 mg Oral Daily   FLUoxetine  40 mg Oral Daily   furosemide  40 mg Oral Daily   ipratropium  0.5 mg Nebulization Q12H   melatonin  5 mg Oral QHS   metoprolol tartrate  50 mg Oral BID   mometasone-formoterol  2 puff Inhalation BID   multivitamin with minerals  1 tablet Oral Daily   nicotine  21 mg Transdermal Daily   pantoprazole  40 mg Oral Daily   potassium chloride  40 mEq Oral BID   predniSONE  40 mg Oral Q breakfast   vitamin E  400 Units Oral BID   Continuous Infusions:  sodium chloride Stopped (11/01/20 1652)   PRN Meds: acetaminophen, dextromethorphan-guaiFENesin, hydrALAZINE, levalbuterol, ondansetron (ZOFRAN) IV, traZODone   Vital Signs    Vitals:   11/03/20 0424 11/03/20 0500 11/03/20 0731 11/03/20 0756  BP: 108/80  117/67   Pulse: (!) 102  94   Resp: 17  18   Temp: 98.6 F (37 C)  97.9 F (36.6 C)   TempSrc: Oral     SpO2: 97%  95% 93%  Weight:  55.5 kg    Height:        Intake/Output Summary (Last 24 hours) at  11/03/2020 1004 Last data filed at 11/03/2020 0731 Gross per 24 hour  Intake 836.01 ml  Output 250 ml  Net 586.01 ml    Last 3 Weights 11/03/2020 11/01/2020 10/31/2020  Weight (lbs) 122 lb 5.7 oz 121 lb 11.1 oz 115 lb 6.4 oz  Weight (kg) 55.5 kg 55.2 kg 52.345 kg      Telemetry    Afib (did not hold DCCV) with rates in the 90s to low 100s- Personally Reviewed  ECG    No new tracings - Personally Reviewed  Physical Exam   GEN: No acute distress.   Neck: No JVD Cardiac: IRIR and tachycardic, no murmurs, rubs, or gallops.  Respiratory: Reduced bibasilar breath sounds with faint right base crackles. GI: Soft, nontender, non-distended  MS: No edema; No deformity. Neuro:  Nonfocal  Psych: Normal affect   Labs    High Sensitivity Troponin:   Recent Labs  Lab 10/12/20 1820 10/12/20 2117 10/26/20 2220 10/27/20 0027 10/29/20 0836  TROPONINIHS 26* 25* 19* 16 17       Chemistry Recent Labs  Lab 10/29/20 7616 10/31/20 0428 10/31/20 2000 11/01/20 0500 11/03/20 0703  NA 140 135 132* 134* 140  K 4.0 3.4* 4.1 4.2 5.0  CL 99 95* 94* 96* 101  CO2 29 28 27 26  33*  GLUCOSE 98 165* 141* 177* 107*  BUN 23 33* 37* 35* 30*  CREATININE 1.03* 0.97 1.25* 0.98 0.89  CALCIUM 9.4 8.9 9.0 8.9 8.9  PROT 6.5 6.5  --  6.0*  --   ALBUMIN 3.6 3.6  --  3.3*  --   AST 67* 82*  --  96*  --   ALT 61* 82*  --  121*  --   ALKPHOS 67 67  --  71  --   BILITOT 1.0 0.9  --  0.9  --   GFRNONAA 59* >60 47* >60 >60  ANIONGAP 12 12 11 12 6       Hematology Recent Labs  Lab 10/31/20 0428 11/01/20 0500 11/03/20 0703  WBC 8.1 9.3 9.6  RBC 3.63* 3.88 4.12  HGB 12.1 12.9 14.0  HCT 35.5* 37.5 40.5  MCV 97.8 96.6 98.3  MCH 33.3 33.2 34.0  MCHC 34.1 34.4 34.6  RDW 13.0 12.9 13.2  PLT 166 183 189     BNP Recent Labs  Lab 10/29/20 0836  BNP 1,282.1*      DDimer No results for input(s): DDIMER in the last 168 hours.   Radiology    DG Chest Port 1 View  Result Date:  11/02/2020 CLINICAL DATA:  Shortness of breath EXAM: PORTABLE CHEST 1 VIEW COMPARISON:  10/29/2020 FINDINGS: Heart is upper limits normal in size. Diffuse interstitial prominence throughout the lungs with mild vascular congestion. Suspect small bilateral effusions. Bibasilar atelectasis. IMPRESSION: Diffuse interstitial prominence could reflect interstitial edema or chronic lung disease. Small bilateral effusions with bibasilar atelectasis. Electronically Signed   By: 11/04/2020 M.D.   On: 11/02/2020 10:07    Cardiac Studies   09/22/2020 1. Left ventricular ejection fraction, by estimation, is 60 to 65%. The  left ventricle has normal function. The left ventricle has no regional  wall motion abnormalities. Left ventricular diastolic parameters are  indeterminate. The average left  ventricular global longitudinal strain is -9.4 %. The global longitudinal  strain is abnormal.   2. Right ventricular systolic function is normal. The right ventricular  size is normal. There is mildly elevated pulmonary artery systolic  pressure. The estimated right ventricular systolic pressure is 38.2 mmHg.   3. The mitral valve is normal in structure. Mild to moderate mitral valve  regurgitation.   4. Tricuspid valve regurgitation is moderate.   5. Left atrial size was mildly dilated.   6. Rhythm is atrial fibrillation, rate 120 bpm   TEE 09/25/2020: 1. Left ventricular ejection fraction, by estimation, is 55 to 60%. The  left ventricle has normal function. The left ventricle has no regional  wall motion abnormalities.   2. Right ventricular systolic function is normal. The right ventricular  size is normal. There is normal pulmonary artery systolic pressure.   3. Left atrial size was mildly dilated. 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 Small mobile thrombus in mid LAA   4. Right atrial size was mildly dilated.   5. The mitral valve is normal in structure. Mild mitral valve   regurgitation. No evidence of mitral stenosis.   6. Tricuspid valve regurgitation is mild to moderate.   7. The aortic valve is tricuspid. Aortic valve regurgitation is not  visualized. No aortic stenosis is present.   8. There is Moderate (Grade III) plaque involving  the descending aorta.   9. Agitated saline contrast bubble study was negative, with no evidence  of any interatrial shunt.  __________   2D echo 09/22/2020: 1. Left ventricular ejection fraction, by estimation, is 60 to 65%. The  left ventricle has normal function. The left ventricle has no regional  wall motion abnormalities. Left ventricular diastolic parameters are  indeterminate. The average left  ventricular global longitudinal strain is -9.4 %. The global longitudinal  strain is abnormal.   2. Right ventricular systolic function is normal. The right ventricular  size is normal. There is mildly elevated pulmonary artery systolic  pressure. The estimated right ventricular systolic pressure is 38.2 mmHg.   3. The mitral valve is normal in structure. Mild to moderate mitral valve  regurgitation.   4. Tricuspid valve regurgitation is moderate.   5. Left atrial size was mildly dilated.   Patient Profile     70 y.o. female with hx of Afib s/p DCCV 11/01/20, HFpEF, HTN, HLD, COPD, tobacco use, and GERD, and seen of Afib and AOC HFpEF.   Assessment & Plan    Afib s/p DCCV --No significant tachypalpitations.  Briefly held NSR after DCCV 10/26/2020 and repeat DCCV 11/01/20.  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   Started on oral amiodarone 400 mg twice daily for a 10 g load.  Recommended follow-up in the office within the next 1 week for amiodarone monitoring with labs and EKG.  Continue Lopressor and diltiazem for rate control.  Consider consolidation of rate control in the future/at outpatient follow-up.  Avoid digoxin given elevated prior digoxin level.  Continue anticoagulation with  Eliquis and CHA2DS2-VASc score of at least 4 (CHF, hypertension, age, female).  Will send a message to the office regarding follow-up labs and office visit.  Acute on chronic combined systolic and diastolic CHF --Reports resolution this morning of previous dyspnea with ambulation to the restroom.  She required an increase in diuresis this admission with IV Lasix 80 mg total and p.o. Lasix 40 mg total yesterday, as well as additional IV Lasix this morning.  As a result, she successfully put out -3L yesterday and -3L this AM. Wt stable at 55.5kg. Cr stable overnight to somewhat improved. Recommend that increase her PTA oral lasix from previous lasix 40mg  qd to lasix 40 mg at BID frequency.  Increase PTA KCl tab from to KCL tab 40 M EQ daily. Continue metoprolol and diltiazem.  Continue to monitor I/os, daily standing weights, daily BMET.  Maintain electrolytes at goal.  Will need close outpatient follow-up with repeat labs/BMET at that time.  Essential hypertension -- Continue medical management as above.  HLD --Continue statin.  For questions or updates, please contact [facility] Please consult [email or web address] for contact info under        Signed, , PA-C  11/03/2020, 10:04 AM

## 2020-11-03 NOTE — Discharge Summary (Signed)
Physician Discharge Summary   Angela Singh  female DOB: April 11, 1950  ZOX:[id number]  PCP: Oswaldo Conroy, MD  Admit date: 10/29/2020 Discharge date: 11/03/2020  Admitted From: home Disposition:  home CODE STATUS: Full code   Hospital Course:  For full details, please see H&P, progress notes, consult notes and ancillary notes.  Briefly,  Angela Singh is a 70 year old female with past medical history of CHF related to difficult to control paroxysmal A. fib with RVR (status post cardioversion on 10/26/2020), hypertension, COPD not on home oxygen, tobacco abuse who presented to the ED on 10/29/2020 with persistent shortness of breath.  She had been seen in the ED 2 days prior diagnosed with COPD exacerbation, discharged with prednisone and Zithromax.  Her symptoms failed to improve and she was having recurrent heart palpitations.  Return to the ER where she was found to have A. fib with RVR with heart rate into the 130s with associated decompensated heart failure.  Started on Cardizem drip and IV diuresis.  Admitted to [facility] service with cardiology consulted.  Acute on chronic diastolic CHF  -EF 45 to 50% on TEE 10/26/2020.  Presented with pulmonary vascular congestion on chest x-ray and some lower extremity edema, elevated BNP in the setting of A. fib RVR.  consistent with decompensation requiring IV diuresis. --Cardiology managing --Treated with IV Lasix, transitioned to oral lasix 40 mg BID at discharge per cardio rec. --cont metop --discharge plans discussed with cardiology PA prior to discharge.   Persistent A. fib with RVR  -patient has history of difficult to control A. fib.  Recent cardioversion on 10/26/2020 was successful but now with recurrent A. fib. --started on amio gtt --cardioversion 9/28, again reverted back to Afib shortly after --switched to oral amio 400 mg BID, per cardiology, Will need 2 to 3-week load before reattempting cardioversion. --cont Lopressor 50 mg  BID --cont Cardizem --Continue Eliquis --Prior toxicity with digoxin, attempt to avoid --discharge plans discussed with cardiology PA prior to discharge.   COPD with acute exacerbation -POA  wheezing on exam and shortness of breath on presentation.  No wheezing days before discharge.  Dyspnea mostly on exertion. --started on IV solumedrol and finished steroid burst with prednisone. --discharged on home Symbicort.   Hypokalemia  --Pt received oral potassium 40 BID during hospitalization while receiving IV diuresis.  K+ level started to trend up towards discharge, so pt was discharged back on home oral potassium 20 daily.   Sepsis ruled out  -patient does not have any evidence of infection.  Tachycardia and tachypnea secondary to A. fib with RVR and CHF decompensation.  Procalcitonin and lactic acid both negative   Hypertension  --cont metop, cardizem --received IV lasix during hospitalization and discharged on oral lasix.  Hyperlipidemia  -continue atorvastatin   Tobacco abuse  -patient counseled on importance of cessation.   --cont nicotine patch  Mild transaminitis -present on admission likely due to liver congestion in the setting of CHF decompensation.     Patient BMI: Body mass index is 20.89 kg/m.    Discharge Diagnoses:  Principal Problem:   Acute on chronic systolic CHF (congestive heart failure) (HCC) Active Problems:   Atrial fibrillation with RVR (HCC)   Essential hypertension   Hyperlipidemia   COPD exacerbation (HCC)   Tobacco abuse   Sepsis (HCC)   Abnormal LFTs   Acute on chronic congestive heart failure (HCC)   30 Day Unplanned Readmission Risk Score    Flowsheet Row ED to Hosp-Admission (Current)  from 10/29/2020 in [facility]  30 Day Unplanned Readmission Risk Score (%) 22.03 Filed at 11/03/2020 0801       This score is the patient's risk of an unplanned readmission within 30 days of being discharged (0 -100%). The score is  based on dignosis, age, lab data, medications, orders, and past utilization.   Low:  0-14.9   Medium: 15-21.9   High: 22-29.9   Extreme: 30 and above         Discharge Instructions:  Allergies as of 11/03/2020       Reactions   Codeine Other (See Comments)   weakness        Medication List     STOP taking these medications    azithromycin 250 MG tablet Commonly known as: ZITHROMAX   predniSONE 50 MG tablet Commonly known as: DELTASONE       TAKE these medications    albuterol 108 (90 Base) MCG/ACT inhaler Commonly known as: VENTOLIN HFA Inhale 2 puffs into the lungs every 4 (four) hours as needed.   amiodarone 400 MG tablet Commonly known as: PACERONE Take 1 tablet (400 mg total) by mouth 2 (two) times daily.   apixaban 5 MG Tabs tablet Commonly known as: ELIQUIS Take 1 tablet (5 mg total) by mouth 2 (two) times daily.   ascorbic acid 500 MG tablet Commonly known as: VITAMIN C Take 1,000 mg by mouth 2 (two) times daily.   atorvastatin 20 MG tablet Commonly known as: LIPITOR Take 20 mg by mouth at bedtime.   budesonide-formoterol 160-4.5 MCG/ACT inhaler Commonly known as: SYMBICORT Inhale 1 puff into the lungs 2 (two) times daily in the am and at bedtime.Marland Kitchen   diltiazem 180 MG 24 hr capsule Commonly known as: CARDIZEM CD Take 1 capsule (180 mg total) by mouth daily.   FLUoxetine 40 MG capsule Commonly known as: PROZAC Take 40 mg by mouth daily.   furosemide 40 MG tablet Commonly known as: LASIX Take 1 tablet (40 mg total) by mouth 2 (two) times daily. What changed: when to take this   metoprolol tartrate 50 MG tablet Commonly known as: LOPRESSOR Take 1 tablet (50 mg total) by mouth 2 (two) times daily. Increased from 25 mg 2 times daily. What changed:  medication strength how much to take additional instructions   nicotine 21 mg/24hr patch Commonly known as: NICODERM CQ - dosed in mg/24 hours Place 1 patch (21 mg total) onto the skin  daily.   ONE-A-DAY WOMENS 50+ PO Take 1 tablet by mouth daily.   pantoprazole 40 MG tablet Commonly known as: PROTONIX Take 40 mg by mouth daily.   potassium chloride SA 20 MEQ tablet Commonly known as: KLOR-CON Take 1 tablet (20 mEq total) by mouth daily.   vitamin E 180 MG (400 UNITS) capsule Take 400 Units by mouth 2 (two) times daily.         Follow-up Information     Bender, Earl Lagos, MD Follow up in 1 week(s).   Specialty: Family Medicine Why: Office closed.. Patient will need to make a follow up appointment. Contact information: [address] [place] [address] [phone]         Antonieta Iba, MD Follow up on 11/06/2020.   Specialty: Cardiology Why: @ 10:55am Contact information: [address] [place] [address] [phone]                 Allergies  Allergen Reactions  Codeine Other (See Comments)    weakness     The results of significant diagnostics from this hospitalization (including imaging, microbiology, ancillary and laboratory) are listed below for reference.   Consultations:   Procedures/Studies: DG Chest 2 View  Result Date: 10/12/2020 CLINICAL DATA:  Weakness EXAM: CHEST - 2 VIEW COMPARISON:  Chest x-ray dated September 21, 2020 FINDINGS: Cardiac and mediastinal contours are unchanged within normal limits. Interval resolution of bilateral interstitial opacities. Lungs are clear. Interval resolution of trace bilateral pleural effusions. No evidence pneumothorax. IMPRESSION: Lungs are clear. Interval resolution of trace bilateral pleural effusions. Electronically Signed   By: Allegra Jaymason Ledesma M.D.   On: 10/12/2020 13:13   DG Chest Port 1 View  Result Date: 11/02/2020 CLINICAL DATA:  Shortness of breath EXAM: PORTABLE CHEST 1 VIEW COMPARISON:  10/29/2020 FINDINGS: Heart is upper limits normal in size. Diffuse interstitial prominence throughout the lungs with mild vascular congestion.  Suspect small bilateral effusions. Bibasilar atelectasis. IMPRESSION: Diffuse interstitial prominence could reflect interstitial edema or chronic lung disease. Small bilateral effusions with bibasilar atelectasis. Electronically Signed   By: Charlett Nose M.D.   On: 11/02/2020 10:07   DG Chest Portable 1 View  Result Date: 10/29/2020 CLINICAL DATA:  Shortness of breath. History of atrial fibrillation. EXAM: PORTABLE CHEST 1 VIEW COMPARISON:  10/26/2020 FINDINGS: Heart size and mediastinal contours are unremarkable. Aortic atherosclerosis. No pleural effusion identified. Diffuse increased scratch set increased pulmonary vascularity is noted. No superimposed airspace consolidation. IMPRESSION: Pulmonary vascular congestion. Electronically Signed   By: Signa Kell M.D.   On: 10/29/2020 09:23   DG Chest Portable 1 View  Result Date: 10/26/2020 CLINICAL DATA:  Shortness of breath and wheezing. Cardioversion this morning. EXAM: PORTABLE CHEST 1 VIEW COMPARISON:  10/12/2020 FINDINGS: Mild cardiac enlargement. Mild vascular congestion. Slight interstitial pattern in the lung bases may indicate early edema. This is progressing since the prior study. No focal consolidation. Suggestion of tiny right pleural effusion. No pneumothorax. Calcified granulomas in the apices. Calcification of the aorta. IMPRESSION: Mild cardiac enlargement and mild pulmonary vascular congestion. Suggestion of developing interstitial edema in the lung bases. Possible small right pleural effusion. Electronically Signed   By: Burman Nieves M.D.   On: 10/26/2020 22:26   ECHO TEE  Result Date: 10/26/2020    TRANSESOPHOGEAL ECHO REPORT   Patient Name:   Angela Singh Suliman Date of Exam: 10/26/2020 Medical Rec #:  [id number]      Height:       64.0 in Accession #:    [id number]     Weight:       110.0 lb Date of Birth:  [birth year]      BSA:          1.517 m Patient Age:    69 years       BP:           108/64 mmHg Patient Gender: F              HR:            58 bpm. Exam Location:  [facility] Procedure: Transesophageal Echo, Cardiac Doppler, Color Doppler and Saline            Contrast Bubble Study Indications:     Atrial Fibrillation I48.91  History:         Patient has prior history of Echocardiogram examinations, most                  recent 09/25/2020. COPD,  Arrythmias:Atrial Fibrillation; Risk                  Factors:Hypertension.  Sonographer:     Cristela Blue Referring Phys:  [id number] CHRISTOPHER RONALD BERGE Diagnosing Phys: Julien Nordmann MD PROCEDURE: After discussion of the risks and benefits of a TEE, an informed consent was obtained from the patient. The transesophogeal probe was passed without difficulty through the esophogus of the patient. Imaged were obtained with the patient in a left lateral decubitus position. Local oropharyngeal anesthetic was provided with Benzocaine spray and viscous lidocaine. Sedation performed by different physician. Image quality was good. The patient's vital signs; including heart rate, blood pressure, and oxygen saturation; remained stable throughout the procedure. The patient developed no complications during the procedure. A successful direct current cardioversion was performed at 150 joules with 1 attempt. IMPRESSIONS  1. Left ventricular ejection fraction, by estimation, is 45 to 50%. The left ventricle has mildly decreased function. The left ventricle has no regional wall motion abnormalities. There is mild left ventricular hypertrophy. Left ventricular diastolic parameters are indeterminate.  2. Right ventricular systolic function is normal. The right ventricular size is normal. There is normal pulmonary artery systolic pressure. The estimated right ventricular systolic pressure is 29.2 mmHg.  3. The mitral valve is normal in structure. Mild to moderate mitral valve regurgitation. No evidence of mitral stenosis.  4. Tricuspid valve regurgitation is mild to moderate.  5. The aortic valve is normal in structure. Aortic  valve regurgitation is not visualized. Mild aortic valve sclerosis is present, with no evidence of aortic valve stenosis.  6. The inferior vena cava is normal in size with greater than 50% respiratory variability, suggesting right atrial pressure of 3 mmHg.  7. Agitated saline contrast bubble study was positive with shunting observed within 3-6 cardiac cycles suggestive of interatrial shunt. There is a small patent foramen ovale with predominantly right to left shunting across the atrial septum.  8. Rhythm is atrial fibrillation.  9. Left atrial size was moderately dilated. No left atrial/left atrial appendage thrombus was detected. 10. Right atrial size was moderately dilated. 11. There is mild (Grade II) atheroma plaque involving the descending aorta. Conclusion(s)/Recommendation(s): Normal biventricular function without evidence of hemodynamically significant valvular heart disease. FINDINGS  Left Ventricle: Left ventricular ejection fraction, by estimation, is 45 to 50%. The left ventricle has mildly decreased function. The left ventricle has no regional wall motion abnormalities. The left ventricular internal cavity size was normal in size. There is mild left ventricular hypertrophy. Left ventricular diastolic parameters are indeterminate. Right Ventricle: The right ventricular size is normal. No increase in right ventricular wall thickness. Right ventricular systolic function is normal. There is normal pulmonary artery systolic pressure. The tricuspid regurgitant velocity is 2.46 m/s, and  with an assumed right atrial pressure of 5 mmHg, the estimated right ventricular systolic pressure is 29.2 mmHg. Left Atrium: Left atrial size was moderately dilated. No left atrial/left atrial appendage thrombus was detected. Right Atrium: Right atrial size was moderately dilated. Pericardium: There is no evidence of pericardial effusion. Mitral Valve: The mitral valve is normal in structure. Mild to moderate mitral valve  regurgitation. No evidence of mitral valve stenosis. Tricuspid Valve: The tricuspid valve is normal in structure. Tricuspid valve regurgitation is mild to moderate. No evidence of tricuspid stenosis. Aortic Valve: The aortic valve is normal in structure. Aortic valve regurgitation is not visualized. Mild aortic valve sclerosis is present, with no evidence of aortic valve stenosis. Pulmonic Valve: The pulmonic valve  was normal in structure. Pulmonic valve regurgitation is not visualized. No evidence of pulmonic stenosis. Aorta: The aortic root is normal in size and structure. There is mild (Grade II) atheroma plaque involving the descending aorta. Venous: The inferior vena cava is normal in size with greater than 50% respiratory variability, suggesting right atrial pressure of 3 mmHg. IAS/Shunts: No atrial level shunt detected by color flow Doppler. Agitated saline contrast was given intravenously to evaluate for intracardiac shunting. Agitated saline contrast bubble study was positive with shunting observed within 3-6 cardiac cycles suggestive of interatrial shunt. A small patent foramen ovale is detected with predominantly right to left shunting across the atrial septum.  TRICUSPID VALVE TR Peak grad:   24.2 mmHg TR Vmax:        246.00 cm/s Julien Nordmann MD Electronically signed by Julien Nordmann MD Signature Date/Time: 10/26/2020/4:33:50 PM    Final       Labs: BNP (last 3 results) Recent Labs    10/12/20 1006 10/26/20 2220 10/29/20 0836  BNP 1,062.8* 522.0* 1,282.1*   Basic Metabolic Panel: Recent Labs  Lab 10/29/20 0836 10/31/20 0428 10/31/20 2000 11/01/20 0500 11/03/20 0703  NA 140 135 132* 134* 140  K 4.0 3.4* 4.1 4.2 5.0  CL 99 95* 94* 96* 101  CO2 29 28 27 26  33*  GLUCOSE 98 165* 141* 177* 107*  BUN 23 33* 37* 35* 30*  CREATININE 1.03* 0.97 1.25* 0.98 0.89  CALCIUM 9.4 8.9 9.0 8.9 8.9  MG  --  1.8 2.1 2.0 1.9   Liver Function Tests: Recent Labs  Lab 10/29/20 0836  10/31/20 0428 11/01/20 0500  AST 67* 82* 96*  ALT 61* 82* 121*  ALKPHOS 67 67 71  BILITOT 1.0 0.9 0.9  PROT 6.5 6.5 6.0*  ALBUMIN 3.6 3.6 3.3*   No results for input(s): LIPASE, AMYLASE in the last 168 hours. No results for input(s): AMMONIA in the last 168 hours. CBC: Recent Labs  Lab 10/29/20 0836 10/31/20 0428 11/01/20 0500 11/03/20 0703  WBC 11.2* 8.1 9.3 9.6  NEUTROABS 8.8*  --   --   --   HGB 13.1 12.1 12.9 14.0  HCT 39.0 35.5* 37.5 40.5  MCV 101.8* 97.8 96.6 98.3  PLT 182 166 183 189   Cardiac Enzymes: No results for input(s): CKTOTAL, CKMB, CKMBINDEX, TROPONINI in the last 168 hours. BNP: Invalid input(s): POCBNP CBG: No results for input(s): GLUCAP in the last 168 hours. D-Dimer No results for input(s): DDIMER in the last 72 hours. Hgb A1c No results for input(s): HGBA1C in the last 72 hours. Lipid Profile No results for input(s): CHOL, HDL, LDLCALC, TRIG, CHOLHDL, LDLDIRECT in the last 72 hours. Thyroid function studies No results for input(s): TSH, T4TOTAL, T3FREE, THYROIDAB in the last 72 hours.  Invalid input(s): FREET3 Anemia work up No results for input(s): VITAMINB12, FOLATE, FERRITIN, TIBC, IRON, RETICCTPCT in the last 72 hours. Urinalysis No results found for: COLORURINE, APPEARANCEUR, LABSPEC, PHURINE, GLUCOSEU, HGBUR, BILIRUBINUR, KETONESUR, PROTEINUR, UROBILINOGEN, NITRITE, LEUKOCYTESUR Sepsis Labs Invalid input(s): PROCALCITONIN,  WBC,  LACTICIDVEN Microbiology Recent Results (from the past 240 hour(s))  SARS CORONAVIRUS 2 (TAT 6-24 HRS) Nasopharyngeal Nasopharyngeal Swab     Status: None   Collection Time: 10/25/20  9:22 AM   Specimen: Nasopharyngeal Swab  Result Value Ref Range Status   SARS Coronavirus 2 NEGATIVE NEGATIVE Final    Comment: (NOTE) SARS-CoV-2 target nucleic acids are NOT DETECTED.  The SARS-CoV-2 RNA is generally detectable in upper and lower respiratory specimens during  the acute phase of infection. Negative results  do not preclude SARS-CoV-2 infection, do not rule out co-infections with other pathogens, and should not be used as the sole basis for treatment or other patient management decisions. Negative results must be combined with clinical observations, patient history, and epidemiological information. The expected result is Negative.  Fact Sheet for Patients: HairSlick.no  Fact Sheet for Healthcare Providers: quierodirigir.com  This test is not yet approved or cleared by the [place] FDA and  has been authorized for detection and/or diagnosis of SARS-CoV-2 by FDA under an Emergency Use Authorization (EUA). This EUA will remain  in effect (meaning this test can be used) for the duration of the COVID-19 declaration under Se ction 564(b)(1) of the Act, 21 U.S.C. section 360bbb-3(b)(1), unless the authorization is terminated or revoked sooner.  Performed at [facility], [address], [place], [place] [address]   Resp Panel by RT-PCR (Flu A&B, Covid) Nasopharyngeal Swab     Status: None   Collection Time: 10/29/20  8:54 AM   Specimen: Nasopharyngeal Swab; Nasopharyngeal(NP) swabs in vial transport medium  Result Value Ref Range Status   SARS Coronavirus 2 by RT PCR NEGATIVE NEGATIVE Final    Comment: (NOTE) SARS-CoV-2 target nucleic acids are NOT DETECTED.  The SARS-CoV-2 RNA is generally detectable in upper respiratory specimens during the acute phase of infection. The lowest concentration of SARS-CoV-2 viral copies this assay can detect is 138 copies/mL. A negative result does not preclude SARS-Cov-2 infection and should not be used as the sole basis for treatment or other patient management decisions. A negative result may occur with  improper specimen collection/handling, submission of specimen other than nasopharyngeal swab, presence of viral mutation(s) within the areas targeted by this assay, and inadequate number  of viral copies(<138 copies/mL). A negative result must be combined with clinical observations, patient history, and epidemiological information. The expected result is Negative.  Fact Sheet for Patients:  BloggerCourse.com  Fact Sheet for Healthcare Providers:  SeriousBroker.it  This test is no t yet approved or cleared by the [place] FDA and  has been authorized for detection and/or diagnosis of SARS-CoV-2 by FDA under an Emergency Use Authorization (EUA). This EUA will remain  in effect (meaning this test can be used) for the duration of the COVID-19 declaration under Section 564(b)(1) of the Act, 21 U.S.C.section 360bbb-3(b)(1), unless the authorization is terminated  or revoked sooner.       Influenza A by PCR NEGATIVE NEGATIVE Final   Influenza B by PCR NEGATIVE NEGATIVE Final    Comment: (NOTE) The Xpert Xpress SARS-CoV-2/FLU/RSV plus assay is intended as an aid in the diagnosis of influenza from Nasopharyngeal swab specimens and should not be used as a sole basis for treatment. Nasal washings and aspirates are unacceptable for Xpert Xpress SARS-CoV-2/FLU/RSV testing.  Fact Sheet for Patients: BloggerCourse.com  Fact Sheet for Healthcare Providers: SeriousBroker.it  This test is not yet approved or cleared by the [place] FDA and has been authorized for detection and/or diagnosis of SARS-CoV-2 by FDA under an Emergency Use Authorization (EUA). This EUA will remain in effect (meaning this test can be used) for the duration of the COVID-19 declaration under Section 564(b)(1) of the Act, 21 U.S.C. section 360bbb-3(b)(1), unless the authorization is terminated or revoked.  Performed at [facility], [address] [place], [place] [address]      Total time spend on discharging this patient, including the last patient exam, discussing the hospital  stay, instructions for ongoing care as it relates to all pertinent caregivers, as well as preparing the medical discharge records, prescriptions, and/or referrals as applicable, is 40 minutes.    Darlin Priestly, MD  [facility] 11/03/2020, 10:24 AM

## 2020-11-03 NOTE — Progress Notes (Signed)
 Progress Note  Patient Name: Angela Singh Date of Encounter: 11/03/2020  Primary Cardiologist: Timothy Gollan, MD   Subjective   No chest pain.  No shortness of breath at rest.  Reports previous dyspnea with ambulation to the restroom has now resolved.    Reports improvement is associated with recent increase in regimen of diuresis.  She is s/p oral Lasix 40 mg daily with additional IV Lasix 40 mg x 2 yesterday, as well as an additional IV Lasix 40 mg x 1 this morning.    Discussed that, as she has been holding onto more fluid in while atrial fibrillation, we will increase her PTA home diuresis to Lasix 40 mg twice daily with recommendation she take the second dose no later than 1 PM and also reviewed that we will increase her potassium to 40 M EQ daily.  Inpatient Medications    Scheduled Meds:  amiodarone  400 mg Oral BID   apixaban  5 mg Oral BID   ascorbic acid  1,000 mg Oral BID   atorvastatin  20 mg Oral QHS   diltiazem  180 mg Oral Daily   FLUoxetine  40 mg Oral Daily   furosemide  40 mg Oral Daily   ipratropium  0.5 mg Nebulization Q12H   melatonin  5 mg Oral QHS   metoprolol tartrate  50 mg Oral BID   mometasone-formoterol  2 puff Inhalation BID   multivitamin with minerals  1 tablet Oral Daily   nicotine  21 mg Transdermal Daily   pantoprazole  40 mg Oral Daily   potassium chloride  40 mEq Oral BID   predniSONE  40 mg Oral Q breakfast   vitamin E  400 Units Oral BID   Continuous Infusions:  sodium chloride Stopped (11/01/20 1652)   PRN Meds: acetaminophen, dextromethorphan-guaiFENesin, hydrALAZINE, levalbuterol, ondansetron (ZOFRAN) IV, traZODone   Vital Signs    Vitals:   11/03/20 0424 11/03/20 0500 11/03/20 0731 11/03/20 0756  BP: 108/80  117/67   Pulse: (!) 102  94   Resp: 17  18   Temp: 98.6 F (37 C)  97.9 F (36.6 C)   TempSrc: Oral     SpO2: 97%  95% 93%  Weight:  55.5 kg    Height:        Intake/Output Summary (Last 24 hours) at  11/03/2020 1004 Last data filed at 11/03/2020 0731 Gross per 24 hour  Intake 836.01 ml  Output 250 ml  Net 586.01 ml    Last 3 Weights 11/03/2020 11/01/2020 10/31/2020  Weight (lbs) 122 lb 5.7 oz 121 lb 11.1 oz 115 lb 6.4 oz  Weight (kg) 55.5 kg 55.2 kg 52.345 kg      Telemetry    Afib (did not hold DCCV) with rates in the 90s to low 100s- Personally Reviewed  ECG    No new tracings - Personally Reviewed  Physical Exam   GEN: No acute distress.   Neck: No JVD Cardiac: IRIR and tachycardic, no murmurs, rubs, or gallops.  Respiratory: Reduced bibasilar breath sounds with faint right base crackles. GI: Soft, nontender, non-distended  MS: No edema; No deformity. Neuro:  Nonfocal  Psych: Normal affect   Labs    High Sensitivity Troponin:   Recent Labs  Lab 10/12/20 1820 10/12/20 2117 10/26/20 2220 10/27/20 0027 10/29/20 0836  TROPONINIHS 26* 25* 19* 16 17       Chemistry Recent Labs  Lab 10/29/20 0836 10/31/20 0428 10/31/20 2000 11/01/20 0500 11/03/20 0703    NA 140 135 132* 134* 140  K 4.0 3.4* 4.1 4.2 5.0  CL 99 95* 94* 96* 101  CO2 29 28 27 26  33*  GLUCOSE 98 165* 141* 177* 107*  BUN 23 33* 37* 35* 30*  CREATININE 1.03* 0.97 1.25* 0.98 0.89  CALCIUM 9.4 8.9 9.0 8.9 8.9  PROT 6.5 6.5  --  6.0*  --   ALBUMIN 3.6 3.6  --  3.3*  --   AST 67* 82*  --  96*  --   ALT 61* 82*  --  121*  --   ALKPHOS 67 67  --  71  --   BILITOT 1.0 0.9  --  0.9  --   GFRNONAA 59* >60 47* >60 >60  ANIONGAP 12 12 11 12 6       Hematology Recent Labs  Lab 10/31/20 0428 11/01/20 0500 11/03/20 0703  WBC 8.1 9.3 9.6  RBC 3.63* 3.88 4.12  HGB 12.1 12.9 14.0  HCT 35.5* 37.5 40.5  MCV 97.8 96.6 98.3  MCH 33.3 33.2 34.0  MCHC 34.1 34.4 34.6  RDW 13.0 12.9 13.2  PLT 166 183 189     BNP Recent Labs  Lab 10/29/20 0836  BNP 1,282.1*      DDimer No results for input(s): DDIMER in the last 168 hours.   Radiology    DG Chest Port 1 View  Result Date:  11/02/2020 CLINICAL DATA:  Shortness of breath EXAM: PORTABLE CHEST 1 VIEW COMPARISON:  10/29/2020 FINDINGS: Heart is upper limits normal in size. Diffuse interstitial prominence throughout the lungs with mild vascular congestion. Suspect small bilateral effusions. Bibasilar atelectasis. IMPRESSION: Diffuse interstitial prominence could reflect interstitial edema or chronic lung disease. Small bilateral effusions with bibasilar atelectasis. Electronically Signed   By: 11/04/2020 M.D.   On: 11/02/2020 10:07    Cardiac Studies   09/22/2020 1. Left ventricular ejection fraction, by estimation, is 60 to 65%. The  left ventricle has normal function. The left ventricle has no regional  wall motion abnormalities. Left ventricular diastolic parameters are  indeterminate. The average left  ventricular global longitudinal strain is -9.4 %. The global longitudinal  strain is abnormal.   2. Right ventricular systolic function is normal. The right ventricular  size is normal. There is mildly elevated pulmonary artery systolic  pressure. The estimated right ventricular systolic pressure is 38.2 mmHg.   3. The mitral valve is normal in structure. Mild to moderate mitral valve  regurgitation.   4. Tricuspid valve regurgitation is moderate.   5. Left atrial size was mildly dilated.   6. Rhythm is atrial fibrillation, rate 120 bpm   TEE 09/25/2020: 1. Left ventricular ejection fraction, by estimation, is 55 to 60%. The  left ventricle has normal function. The left ventricle has no regional  wall motion abnormalities.   2. Right ventricular systolic function is normal. The right ventricular  size is normal. There is normal pulmonary artery systolic pressure.   3. Left atrial size was mildly dilated. 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 Small mobile thrombus in mid LAA   4. Right atrial size was mildly dilated.   5. The mitral valve is normal in structure. Mild mitral valve   regurgitation. No evidence of mitral stenosis.   6. Tricuspid valve regurgitation is mild to moderate.   7. The aortic valve is tricuspid. Aortic valve regurgitation is not  visualized. No aortic stenosis is present.   8. There is Moderate (Grade III) plaque involving  the descending aorta.   9. Agitated saline contrast bubble study was negative, with no evidence  of any interatrial shunt.  __________   2D echo 09/22/2020: 1. Left ventricular ejection fraction, by estimation, is 60 to 65%. The  left ventricle has normal function. The left ventricle has no regional  wall motion abnormalities. Left ventricular diastolic parameters are  indeterminate. The average left  ventricular global longitudinal strain is -9.4 %. The global longitudinal  strain is abnormal.   2. Right ventricular systolic function is normal. The right ventricular  size is normal. There is mildly elevated pulmonary artery systolic  pressure. The estimated right ventricular systolic pressure is 38.2 mmHg.   3. The mitral valve is normal in structure. Mild to moderate mitral valve  regurgitation.   4. Tricuspid valve regurgitation is moderate.   5. Left atrial size was mildly dilated.   Patient Profile     70 y.o. female with hx of Afib s/p DCCV 11/01/20, HFpEF, HTN, HLD, COPD, tobacco use, and GERD, and seen of Afib and AOC HFpEF.   Assessment & Plan    Afib s/p DCCV --No significant tachypalpitations.  Briefly held NSR after DCCV 10/26/2020 and repeat DCCV 11/01/20.  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   Started on oral amiodarone 400 mg twice daily for a 10 g load.  Recommended follow-up in the office within the next 1 week for amiodarone monitoring with labs and EKG.  Continue Lopressor and diltiazem for rate control.  Consider consolidation of rate control in the future/at outpatient follow-up.  Avoid digoxin given elevated prior digoxin level.  Continue anticoagulation with  Eliquis and CHA2DS2-VASc score of at least 4 (CHF, hypertension, age, female).  Will send a message to the office regarding follow-up labs and office visit.  Acute on chronic combined systolic and diastolic CHF --Reports resolution this morning of previous dyspnea with ambulation to the restroom.  She required an increase in diuresis this admission with IV Lasix 80 mg total and p.o. Lasix 40 mg total yesterday, as well as additional IV Lasix this morning.  As a result, she successfully put out -3L yesterday and -3L this AM. Wt stable at 55.5kg. Cr stable overnight to somewhat improved. Recommend that increase her PTA oral lasix from previous lasix 40mg qd to lasix 40 mg at BID frequency.  Increase PTA KCl tab from 20Meq to KCL tab 40 M EQ daily. Continue metoprolol and diltiazem.  Continue to monitor I/os, daily standing weights, daily BMET.  Maintain electrolytes at goal.  Will need close outpatient follow-up with repeat labs/BMET at that time.  Essential hypertension -- Continue medical management as above.  HLD --Continue statin.  For questions or updates, please contact [facility] Please consult [email or web address] for contact info under        Signed, Arren Laminack D Palyn Scrima, PA-C  11/03/2020, 10:04 AM     

## 2020-11-03 NOTE — Plan of Care (Signed)

## 2020-11-04 NOTE — Interval H&P Note (Signed)
History and Physical Interval Note:  11/04/2020 2:11 PM  Angela Singh  has presented today for surgery, with the diagnosis of TEE and Cardioversion.  The various methods of treatment have been discussed with the patient and family. After consideration of risks, benefits and other options for treatment, the patient has consented to  Procedure(s): TRANSESOPHAGEAL ECHOCARDIOGRAM (TEE) (N/A) CARDIOVERSION (N/A) as a surgical intervention.  The patient's history has been reviewed, patient examined, no change in status, stable for surgery.  I have reviewed the patient's chart and labs.  Questions were answered to the patient's satisfaction.     Julien Nordmann

## 2020-11-04 NOTE — H&P (Signed)
H&P Addendum, pre-TEE and cardioversion  Patient was seen and evaluated prior to -cardioversion procedure Symptoms, prior testing details again confirmed with the patient Patient examined, no significant change from prior exam Lab work reviewed in detail personally by myself Patient understands risk and benefit of the procedure, willing to proceed  Signed, Tim Amos Gaber, MD, Ph.D [facility]  

## 2020-11-05 NOTE — Progress Notes (Deleted)
   Patient ID: Angela Singh, female    DOB: 10/15/1950, 70 y.o.   MRN: [id number]  HPI  Angela Singh is a 70 y/o female with a history of  Echo report from 10/26/20 reviewed and showed an EF of 45-50% along with mild LVH and mild/moderate MR/TR.   Admitted 10/29/20 due to shortness of breath. Found to be in AF RVR. Cardizem drip started. Cardiology consult obtained. Initially given IV lasix with transition to oral diuretics. Antibiotics and IV solumedrol given for COPD exacerbation. Cardizem drip changed to oral cardizem. TEE with successful cardioversion completed. Discharged after 5 days. In the ED 10/26/20 due to COPD exacerbation where she was treated and released.   She presents today for her initial visit with a chief complaint of   Review of Systems    Physical Exam    Assessment & Plan:  1: Chronic heart failure with mildly reduced ejection fraction- - NYHA class - BNP 10/29/20 was 1282.1  2: HTN- - BP - sees PCP (Angela Singh) - BMP 11/03/20 reviewed and showed sodium 140, potassium 5.0, creatinine 0.89 and GFR >60  3: Atrial fibrillation- - saw cardiology Angela Singh) 10/23/20 - cardioverted during recent admission  4: COPD-

## 2020-11-06 ENCOUNTER — Ambulatory Visit: Payer: Medicare HMO | Admitting: Nurse Practitioner

## 2020-11-06 ENCOUNTER — Telehealth: Payer: Self-pay | Admitting: Nurse Practitioner

## 2020-11-06 ENCOUNTER — Encounter: Payer: Self-pay | Admitting: Nurse Practitioner

## 2020-11-06 ENCOUNTER — Telehealth: Payer: Self-pay | Admitting: *Deleted

## 2020-11-06 NOTE — Telephone Encounter (Signed)
-----   Message from Lennon Alstrom, PA-C sent at 11/06/2020 10:05 AM EDT ----- Regarding: Hospital F/u Hello,  This patient was admitted and seen at [facility] for Afib and volume overload.  She has been discharged already from the hospital.  Can you please call and arrange / schedule for follow-up TCM appointment with Dr. Mariah Milling or APP within the next two weeks?  Thank you! Signed, Lennon Alstrom, PA-C 11/06/2020, 10:05 AM Pager [phone]

## 2020-11-06 NOTE — Telephone Encounter (Signed)
Called and rescheduled appointment in November to next week at the request of APP. Patient confirmed that was acceptable with no further questions at this time.

## 2020-11-06 NOTE — Telephone Encounter (Signed)
TCM....  Patient I discharged      They are scheduled to see  Brion Aliment 11-16-20 at 240   They were seen for  afib and volume overload   They need to be seen within  2 weeks     Please call

## 2020-11-06 NOTE — Progress Notes (Deleted)
Office Visit    Patient Name: Angela Singh Date of Encounter: 11/06/2020  Primary Care Provider:  Oswaldo Conroy, MD Primary Cardiologist:  Julien Nordmann, MD  Chief Complaint    70 year old female with a history of hypertension, hyperlipidemia, tobacco abuse, COPD, and GERD, who presents for follow-up related to recurrent atrial fibrillation and heart failure with midrange ejection fraction (EF 45 to 50% September 2022).  Past Medical History    Past Medical History:  Diagnosis Date   Chronic combined systolic (congestive) and diastolic (congestive) heart failure (HCC)    a. 09/2020 Echo: EF 60-65%. No rwma. Nl RV fxn. RVSP 38.55mmHg. Mild to mod MR. Mod TR; b. 10/2020 TEE: EF 45-50%, no rwma, nl RV fxn, RVSP 29.72mmHg, mild to mod MR/TR, Ao sclerosis, small PFO w/ R->L shunting. Mod dil LA.   COPD (chronic obstructive pulmonary disease) (HCC)    Essential hypertension    GERD (gastroesophageal reflux disease)    Mitral regurgitation    Mixed hyperlipidemia    Persistent atrial fibrillation (HCC)    a. Dx 09/2020. CHA2DS2VASc = 3-->Eliquis; b. 10/26/2020 TEE/DCCV: No LA thrombus-->successful DCCV; c. 10/2020 Recurrent Afib-->amio load-->DCCV-->recurrent afib.   Thrombus of left atrial appendage    a. 09/2020 noted on TEE in setting of Afib-->eliquis; b. 10/2020 TEE: no LA thrombus.   Tobacco abuse    Tricuspid regurgitation    Past Surgical History:  Procedure Laterality Date   CARDIOVERSION N/A 10/26/2020   Procedure: CARDIOVERSION;  Surgeon: Antonieta Iba, MD;  Location: [facility] ORS;  Service: Cardiovascular;  Laterality: N/A;   CARDIOVERSION N/A 11/01/2020   Procedure: CARDIOVERSION;  Surgeon: Debbe Odea, MD;  Location: [facility] ORS;  Service: Cardiovascular;  Laterality: N/A;   TEE WITHOUT CARDIOVERSION N/A 09/25/2020   Procedure: TRANSESOPHAGEAL ECHOCARDIOGRAM (TEE);  Surgeon: Iran Ouch, MD;  Location: [facility] ORS;  Service: Cardiovascular;  Laterality: N/A;    TEE WITHOUT CARDIOVERSION N/A 10/26/2020   Procedure: TRANSESOPHAGEAL ECHOCARDIOGRAM (TEE);  Surgeon: Antonieta Iba, MD;  Location: [facility] ORS;  Service: Cardiovascular;  Laterality: N/A;    Allergies  Allergies  Allergen Reactions   Codeine Other (See Comments)    weakness    History of Present Illness    70 year old female with the above past medical history including tobacco abuse, COPD, hypertension, hyperlipidemia, GERD, persistent Afib, and heart failure with midrange ejection fraction (EF 45 to 50% September 2022).In August 2022, she noted progressive dyspnea, cough, and congestion.  She was subsequently evaluated by EMS and found to be tachycardic in A. fib with RVR.  She was seen in [facility] and noted to have mild pulmonary edema with trace bilateral pleural effusions.  She was initially rate controlled on IV diltiazem, IV amiodarone, IV digoxin, and oral beta-blocker.  Echo showed an EF of 60 to 65% with mild to moderate mitral regurgitation.  She required diuresis.  A TEE was performed with plan for cardioversion on August 22 however, she was noted to have a left atrial Penders thrombus.  Amiodarone was discontinued and oral diltiazem was added to beta-blocker and digoxin.  Follow-up labs in September 7 showed a digoxin level of 2.7 and she was referred to the emergency department.  There, she was in A. fib with a slow response in the 50s.  She was relatively hypotensive.  She was hypokalemic and also noted to have mild elevation of AST and ALT (63 and 84 respectively).  Potassium was repleted and she was advised to discontinue diltiazem and  digoxin.  At follow-up office visit on September 19, she complained of fatigue.  She was in A. fib with RVR.  Arrangements were made for TEE and cardioversion, which were performed on September 22.  TEE did not show a left atrial appendage thrombus and cardioversion was initially successful.  Unfortunately, she was admitted September 25 with  recurrent A. fib with RVR and heart failure.  She was diuresed and loaded with intravenous amiodarone.  She underwent repeat cardioversion on September 28 however, reverted back to atrial fibrillation by September 29.  She was switched to oral amiodarone in addition to oral diltiazem and metoprolol, and subsequently discharged with plan for repeat cardioversion following 3-week amiodarone load.  Since hospitalization,  Home Medications    Current Outpatient Medications  Medication Sig Dispense Refill   albuterol (VENTOLIN HFA) 108 (90 Base) MCG/ACT inhaler Inhale 2 puffs into the lungs every 4 (four) hours as needed.     amiodarone (PACERONE) 400 MG tablet Take 1 tablet (400 mg total) by mouth 2 (two) times daily. 60 tablet 0   apixaban (ELIQUIS) 5 MG TABS tablet Take 1 tablet (5 mg total) by mouth 2 (two) times daily. 180 tablet 0   ascorbic acid (VITAMIN C) 500 MG tablet Take 1,000 mg by mouth 2 (two) times daily.     atorvastatin (LIPITOR) 20 MG tablet Take 20 mg by mouth at bedtime.     budesonide-formoterol (SYMBICORT) 160-4.5 MCG/ACT inhaler Inhale 1 puff into the lungs 2 (two) times daily in the am and at bedtime.Marland Kitchen     diltiazem (CARDIZEM CD) 180 MG 24 hr capsule Take 1 capsule (180 mg total) by mouth daily. 90 capsule 3   FLUoxetine (PROZAC) 40 MG capsule Take 40 mg by mouth daily.     furosemide (LASIX) 40 MG tablet Take 1 tablet (40 mg total) by mouth 2 (two) times daily. 60 tablet 0   metoprolol tartrate (LOPRESSOR) 50 MG tablet Take 1 tablet (50 mg total) by mouth 2 (two) times daily. Increased from 25 mg 2 times daily. 60 tablet 0   Multiple Vitamins-Minerals (ONE-A-DAY WOMENS 50+ PO) Take 1 tablet by mouth daily.     nicotine (NICODERM CQ - DOSED IN MG/24 HOURS) 21 mg/24hr patch Place 1 patch (21 mg total) onto the skin daily. 28 patch 0   pantoprazole (PROTONIX) 40 MG tablet Take 40 mg by mouth daily.     potassium chloride SA (KLOR-CON) 20 MEQ tablet Take 1 tablet (20 mEq total)  by mouth daily. 30 tablet 0   vitamin E 180 MG (400 UNITS) capsule Take 400 Units by mouth 2 (two) times daily.     No current facility-administered medications for this visit.     Review of Systems    ***.  All other systems reviewed and are otherwise negative except as noted above.  Physical Exam    VS:  There were no vitals taken for this visit. , BMI There is no height or weight on file to calculate BMI.     GEN: Well nourished, well developed, in no acute distress. HEENT: normal. Neck: Supple, no JVD, carotid bruits, or masses. Cardiac: RRR, no murmurs, rubs, or gallops. No clubbing, cyanosis, edema.  Radials/DP/PT 2+ and equal bilaterally.  Respiratory:  Respirations regular and unlabored, clear to auscultation bilaterally. GI: Soft, nontender, nondistended, BS + x 4. MS: no deformity or atrophy. Skin: warm and dry, no rash. Neuro:  Strength and sensation are intact. Psych: Normal affect.  Accessory Clinical Findings  ECG personally reviewed by me today - *** - no acute changes.  Lab Results  Component Value Date   WBC 9.6 11/03/2020   HGB 14.0 11/03/2020   HCT 40.5 11/03/2020   MCV 98.3 11/03/2020   PLT 189 11/03/2020   Lab Results  Component Value Date   CREATININE 0.89 11/03/2020   BUN 30 (H) 11/03/2020   NA 140 11/03/2020   K 5.0 11/03/2020   CL 101 11/03/2020   CO2 33 (H) 11/03/2020   Lab Results  Component Value Date   ALT 121 (H) 11/01/2020   AST 96 (H) 11/01/2020   ALKPHOS 71 11/01/2020   BILITOT 0.9 11/01/2020   Lab Results  Component Value Date   CHOL 161 10/13/2020   HDL 54 10/13/2020   LDLCALC 93 10/13/2020   TRIG 68 10/13/2020   CHOLHDL 3.0 10/13/2020    Lab Results  Component Value Date   HGBA1C 6.0 (H) 10/12/2020    Assessment & Plan    1.  ***   Nicolasa Ducking, NP 11/06/2020, 9:00 AM

## 2020-11-06 NOTE — Telephone Encounter (Signed)
Spoke with patient and reviewed that she needs sooner appointment than the one she scheduled in November. She verbalized understanding and offered her next week. She was agreeable to come in on 11/16/20 at 2:40 pm. Provider also updated as well. No further needs at this time.

## 2020-11-07 ENCOUNTER — Ambulatory Visit: Payer: Medicare HMO | Admitting: Family

## 2020-11-07 ENCOUNTER — Other Ambulatory Visit: Payer: Self-pay

## 2020-11-07 MED ORDER — POTASSIUM CHLORIDE CRYS ER 20 MEQ PO TBCR: 20.0000 meq | EXTENDED_RELEASE_TABLET | Freq: Every day | ORAL | 0 refills | Status: DC

## 2020-11-07 MED ORDER — DILTIAZEM HCL ER COATED BEADS 180 MG PO CP24: 180.0000 mg | ORAL_CAPSULE | Freq: Every day | ORAL | 0 refills | Status: DC

## 2020-11-07 NOTE — Telephone Encounter (Signed)
*  STAT* If patient is at the pharmacy, call can be transferred to refill team.   1. Which medications need to be refilled? (please list name of each medication and dose if known) Potassium, Diltiazem  2. Which pharmacy/location (including street and city if local pharmacy) is medication to be sent to? [facility]  3. Do they need a 30 day or 90 day supply? 90

## 2020-11-10 NOTE — Progress Notes (Signed)
Patient ID: Angela Singh, female    DOB: 1950/11/24, 70 y.o.   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    Echo report from 10/26/20 reviewed and showed an EF of 45-50% along with mild LVH and mild/moderate MR/TR.   Admitted 10/29/20 due to shortness of breath. Found to be in AF RVR. Cardizem drip started. Cardiology consult obtained. Initially given IV lasix with transition to oral diuretics. Antibiotics and IV solumedrol given for COPD exacerbation. Cardizem drip changed to oral cardizem. TEE with successful cardioversion completed. Discharged after 5 days. In the ED 10/26/20 due to COPD exacerbation where she was treated and released.   She presents today for her initial visit with a chief complaint of moderate fatigue upon minimal exertion. She describes this as chronic in nature having been present for several months. The only associated symptom that she has is where her tongue feels like it's burning especially when she's eating. She denies any difficulty sleeping, dizziness, abdominal distention, palpitations, pedal edema, chest pain, shortness of breath, cough or weight gain.   She says that she's trying to reduce her tobacco use and is now down to 1/2 ppd of cigarettes.   Does add "some" salt to her food but has been using less. Has just started reading food labels looking for sodium.   Past Medical History:  Diagnosis Date   Chronic combined systolic (congestive) and diastolic (congestive) heart failure (HCC)    a. 09/2020 Echo: EF 60-65%. No rwma. Nl RV fxn. RVSP 38.81mmHg. Mild to mod MR. Mod TR; b. 10/2020 TEE: EF 45-50%, no rwma, nl RV fxn, RVSP 29.65mmHg, mild to mod MR/TR, Ao sclerosis, small PFO w/ R->L shunting. Mod dil LA.   COPD (chronic obstructive pulmonary disease) (HCC)    Essential hypertension    GERD  (gastroesophageal reflux disease)    Mitral regurgitation    Mixed hyperlipidemia    Persistent atrial fibrillation (HCC)    a. Dx 09/2020. CHA2DS2VASc = 3-->Eliquis; b. 10/26/2020 TEE/DCCV: No LA thrombus-->successful DCCV; c. 10/2020 Recurrent Afib-->amio load-->DCCV-->recurrent afib.   Thrombus of left atrial appendage    a. 09/2020 noted on TEE in setting of Afib-->eliquis; b. 10/2020 TEE: no LA thrombus.   Tobacco abuse    Tricuspid regurgitation    Past Surgical History:  Procedure Laterality Date   CARDIOVERSION N/A 10/26/2020   Procedure: CARDIOVERSION;  Surgeon: Antonieta Iba, MD;  Location: [facility];  Service: Cardiovascular;  Laterality: N/A;   CARDIOVERSION N/A 11/01/2020   Procedure: CARDIOVERSION;  Surgeon: Debbe Odea, MD;  Location: [facility];  Service: Cardiovascular;  Laterality: N/A;   TEE WITHOUT CARDIOVERSION N/A 09/25/2020   Procedure: TRANSESOPHAGEAL ECHOCARDIOGRAM (TEE);  Surgeon: Iran Ouch, MD;  Location: [facility];  Service: Cardiovascular;  Laterality: N/A;   TEE WITHOUT CARDIOVERSION N/A 10/26/2020   Procedure: TRANSESOPHAGEAL ECHOCARDIOGRAM (TEE);  Surgeon: Antonieta Iba, MD;  Location: [facility];  Service: Cardiovascular;  Laterality: N/A;   Family History  Problem Relation Age of Onset   COPD Mother    Heart failure Father    Heart disease Sister    Social History   Tobacco Use   Smoking status: Every Day    Packs/day: 1.00    Years: 48.00    Pack years: 48.00    Types: Cigarettes   Smokeless tobacco: Never  Substance  Use Topics   Alcohol use: Not Currently   Allergies  Allergen Reactions   Codeine Other (See Comments)    weakness   Prior to Admission medications   Medication Sig Start Date End Date Taking? Authorizing Provider  albuterol (VENTOLIN HFA) 108 (90 Base) MCG/ACT inhaler Inhale 2 puffs into the lungs every 4 (four) hours as needed.   Yes [provider]  amiodarone (PACERONE) 400 MG tablet Take 1 tablet (400  mg total) by mouth 2 (two) times daily. 11/03/20 12/03/20 Yes Darlin Priestly, MD  apixaban (ELIQUIS) 5 MG TABS tablet Take 1 tablet (5 mg total) by mouth 2 (two) times daily. 11/03/20 02/01/21 Yes Darlin Priestly, MD  ascorbic acid (VITAMIN C) 500 MG tablet Take 1,000 mg by mouth 2 (two) times daily.   Yes [provider]  atorvastatin (LIPITOR) 20 MG tablet Take 20 mg by mouth at bedtime.   Yes [provider]  budesonide-formoterol (SYMBICORT) 160-4.5 MCG/ACT inhaler Inhale 1 puff into the lungs 2 (two) times daily in the am and at bedtime.. 08/08/20  Yes [provider]  diltiazem (CARDIZEM CD) 180 MG 24 hr capsule Take 1 capsule (180 mg total) by mouth daily. 11/07/20 02/05/21 Yes End, Cristal Deer, MD  FLUoxetine (PROZAC) 40 MG capsule Take 40 mg by mouth daily.   Yes [provider]  metoprolol tartrate (LOPRESSOR) 50 MG tablet Take 1 tablet (50 mg total) by mouth 2 (two) times daily. Increased from 25 mg 2 times daily. 11/03/20 12/03/20 Yes Darlin Priestly, MD  Multiple Vitamins-Minerals (ONE-A-DAY WOMENS 50+ PO) Take 1 tablet by mouth daily.   Yes [provider]  nicotine (NICODERM CQ - DOSED IN MG/24 HOURS) 21 mg/24hr patch Place 1 patch (21 mg total) onto the skin daily. 11/03/20  Yes Darlin Priestly, MD  pantoprazole (PROTONIX) 40 MG tablet Take 40 mg by mouth daily. 09/11/20  Yes [provider]  potassium chloride SA (KLOR-CON) 20 MEQ tablet Take 1 tablet (20 mEq total) by mouth daily. 11/07/20  Yes End, Cristal Deer, MD  vitamin E 180 MG (400 UNITS) capsule Take 400 Units by mouth 2 (two) times daily.   Yes [provider]  furosemide (LASIX) 40 MG tablet Take 1 tablet (40 mg total) by mouth 2 (two) times daily.  11/03/20 12/03/20  Darlin Priestly, MD    Review of Systems  Constitutional:  Positive for fatigue (easily). Negative for appetite change.  HENT:  Negative for congestion, postnasal drip and sore throat.        Tongue burns with eating   Eyes: Negative.    Respiratory:  Negative for cough, chest tightness and shortness of breath.   Cardiovascular:  Negative for chest pain, palpitations and leg swelling.  Gastrointestinal:  Negative for abdominal distention and abdominal pain.  Endocrine: Negative.   Genitourinary: Negative.   Musculoskeletal:  Negative for back pain and neck pain.  Skin: Negative.   Allergic/Immunologic: Negative.   Neurological:  Negative for dizziness and light-headedness.  Hematological:  Negative for adenopathy. Does not bruise/bleed easily.  Psychiatric/Behavioral:  Negative for dysphoric mood and sleep disturbance (sleeping on 1 pillow). The patient is not nervous/anxious.    Vitals:   11/13/20 1255  BP: 122/81  Pulse: (!) 117  Resp: 20  SpO2: 97%  Weight: 105 lb 6 oz (47.8 kg)  Height: 5\' 4"  (1.626 m)   Wt Readings from Last 3 Encounters:  11/13/20 105 lb 6 oz (47.8 kg)  11/03/20 122 lb 5.7 oz (55.5 kg)  10/26/20 110 lb (49.9 kg)   Lab Results  Component Value Date   CREATININE 0.89 11/03/2020   CREATININE 0.98 11/01/2020   CREATININE 1.25 (H) 10/31/2020   Physical Exam Vitals and nursing note reviewed.  Constitutional:      Appearance: Normal appearance.  HENT:     Head: Normocephalic and atraumatic.  Cardiovascular:     Rate and Rhythm: Regular rhythm. Tachycardia present.  Pulmonary:     Effort: Pulmonary effort is normal. No respiratory distress.     Breath sounds: No wheezing or rales.  Abdominal:     General: Abdomen is flat. There is no distension.     Palpations: Abdomen is soft.  Musculoskeletal:        General: No tenderness.     Cervical back: Normal range of motion and neck supple.     Right lower leg: No edema.     Left lower leg: No edema.  Skin:    General: Skin is warm and dry.  Neurological:     General: No focal deficit present.     Mental Status: She is alert and oriented to person, place, and time.  Psychiatric:        Mood and Affect: Mood normal.        Behavior:  Behavior normal.        Thought Content: Thought content normal.    Assessment & Plan:  1: Chronic heart failure with mildly reduced ejection fraction- - NYHA class III - euvolemic today - weighing daily; instructed to call for an overnight weight gain of > 2 pounds or a weekly weight gain of > 5 pounds - not adding salt and has recently started reading food labels for sodium content; reviewed the importance of trying to keep daily sodium intake to < 2000mg ; low sodium cookbook provided to patient - on metoprolol tartrate but still tachycardic today - BNP 10/29/20 was 1282.1 - reports getting her flu vaccine for this season  2: HTN- - BP looks good (122/81)  - sees PCP (Bender) - BMP 11/03/20 reviewed and showed sodium 140, potassium 5.0, creatinine 0.89 and GFR >60  3: Atrial fibrillation- - saw cardiology 11/05/20) 10/23/20; returns 11/16/20 - cardioverted during recent admission - currently taking metoprolol tartrate, diltiazem and amiodarone - tachycardic today; may need further adjustment of medications  4: COPD- - smoking 1/2 ppd of cigarettes  - has quit in the past  - complete cessation discussed for 3 minutes with her   Patient did not bring her medications nor a list. Each medication was verbally reviewed with the patient and she was encouraged to bring the bottles to every visit to confirm accuracy of list.   Return in 6 weeks or sooner for any questions/problems before then.

## 2020-11-13 ENCOUNTER — Encounter: Payer: Self-pay | Admitting: Family

## 2020-11-13 ENCOUNTER — Ambulatory Visit: Payer: Medicare HMO | Attending: Family | Admitting: Family

## 2020-11-13 VITALS — BP 122/81 | HR 117 | Resp 20 | Ht 64.0 in | Wt 105.4 lb

## 2020-11-13 DIAGNOSIS — Z720 Tobacco use: Secondary | ICD-10-CM | POA: Diagnosis not present

## 2020-11-13 DIAGNOSIS — Z7951 Long term (current) use of inhaled steroids: Secondary | ICD-10-CM | POA: Diagnosis not present

## 2020-11-13 DIAGNOSIS — R000 Tachycardia, unspecified: Secondary | ICD-10-CM | POA: Insufficient documentation

## 2020-11-13 DIAGNOSIS — K219 Gastro-esophageal reflux disease without esophagitis: Secondary | ICD-10-CM | POA: Diagnosis not present

## 2020-11-13 DIAGNOSIS — I10 Essential (primary) hypertension: Secondary | ICD-10-CM | POA: Diagnosis not present

## 2020-11-13 DIAGNOSIS — F17210 Nicotine dependence, cigarettes, uncomplicated: Secondary | ICD-10-CM | POA: Insufficient documentation

## 2020-11-13 DIAGNOSIS — I110 Hypertensive heart disease with heart failure: Secondary | ICD-10-CM | POA: Diagnosis not present

## 2020-11-13 DIAGNOSIS — J449 Chronic obstructive pulmonary disease, unspecified: Secondary | ICD-10-CM | POA: Insufficient documentation

## 2020-11-13 DIAGNOSIS — Z79899 Other long term (current) drug therapy: Secondary | ICD-10-CM | POA: Diagnosis not present

## 2020-11-13 DIAGNOSIS — E785 Hyperlipidemia, unspecified: Secondary | ICD-10-CM | POA: Insufficient documentation

## 2020-11-13 DIAGNOSIS — Z8249 Family history of ischemic heart disease and other diseases of the circulatory system: Secondary | ICD-10-CM | POA: Insufficient documentation

## 2020-11-13 DIAGNOSIS — I4891 Unspecified atrial fibrillation: Secondary | ICD-10-CM | POA: Diagnosis not present

## 2020-11-13 DIAGNOSIS — Z7901 Long term (current) use of anticoagulants: Secondary | ICD-10-CM | POA: Diagnosis not present

## 2020-11-13 DIAGNOSIS — Z885 Allergy status to narcotic agent status: Secondary | ICD-10-CM | POA: Diagnosis not present

## 2020-11-13 DIAGNOSIS — I5022 Chronic systolic (congestive) heart failure: Secondary | ICD-10-CM | POA: Insufficient documentation

## 2020-11-13 NOTE — Patient Instructions (Addendum)
Continue weighing daily and call for an overnight weight gain of > 2 pounds or a weekly weight gain of >5 pounds.     Low-Sodium Eating Plan Sodium, which is an element that makes up salt, helps you maintain a healthy balance of fluids in your body. Too much sodium can increase your bloodpressure and cause fluid and waste to be held in your body. Your health care provider or dietitian may recommend following this plan if you have high blood pressure (hypertension), kidney disease, liver disease, or heart failure. Eating less sodium can help lower your blood pressure, reduce swelling, and protect your heart, liver, andkidneys. What are tips for following this plan? Reading food labels The Nutrition Facts label lists the amount of sodium in one serving of the food. If you eat more than one serving, you must multiply the listed amount of sodium by the number of servings. Choose foods with less than 140 mg of sodium per serving. Avoid foods with 300 mg of sodium or more per serving. Shopping  Look for lower-sodium products, often labeled as "low-sodium" or "no salt added." Always check the sodium content, even if foods are labeled as "unsalted" or "no salt added." Buy fresh foods. Avoid canned foods and pre-made or frozen meals. Avoid canned, cured, or processed meats. Buy breads that have less than 80 mg of sodium per slice.  Cooking  Eat more home-cooked food and less restaurant, buffet, and fast food. Avoid adding salt when cooking. Use salt-free seasonings or herbs instead of table salt or sea salt. Check with your health care provider or pharmacist before using salt substitutes. Cook with plant-based oils, such as canola, sunflower, or olive oil.  Meal planning When eating at a restaurant, ask that your food be prepared with less salt or no salt, if possible. Avoid dishes labeled as brined, pickled, cured, smoked, or made with soy sauce, miso, or teriyaki sauce. Avoid foods that contain  MSG (monosodium glutamate). MSG is sometimes added to Chinese food, bouillon, and some canned foods. Make meals that can be grilled, baked, poached, roasted, or steamed. These are generally made with less sodium. General information Most people on this plan should limit their sodium intake to 2,000 mg (milligrams) of sodium each day. What foods should I eat? Fruits Fresh, frozen, or canned fruit. Fruit juice. Vegetables Fresh or frozen vegetables. "No salt added" canned vegetables. "No salt added"tomato sauce and paste. Low-sodium or reduced-sodium tomato and vegetable juice. Grains Low-sodium cereals, including oats, puffed wheat and rice, and shredded wheat. Low-sodium crackers. Unsalted rice. Unsalted pasta. Low-sodium bread.Whole-grain breads and whole-grain pasta. Meats and other proteins Fresh or frozen (no salt added) meat, poultry, seafood, and fish. Low-sodium canned tuna and salmon. Unsalted nuts. Dried peas, beans, and lentils withoutadded salt. Unsalted canned beans. Eggs. Unsalted nut butters. Dairy Milk. Soy milk. Cheese that is naturally low in sodium, such as ricotta cheese, fresh mozzarella, or Swiss cheese. Low-sodium or reduced-sodium cheese. Creamcheese. Yogurt. Seasonings and condiments Fresh and dried herbs and spices. Salt-free seasonings. Low-sodium mustard and ketchup. Sodium-free salad dressing. Sodium-free light mayonnaise. Fresh orrefrigerated horseradish. Lemon juice. Vinegar. Other foods Homemade, reduced-sodium, or low-sodium soups. Unsalted popcorn and pretzels.Low-salt or salt-free chips. The items listed above may not be a complete list of foods and beverages you can eat. Contact a dietitian for more information. What foods should I avoid? Vegetables Sauerkraut, pickled vegetables, and relishes. Olives. French fries. Onion rings. Regular canned vegetables (not low-sodium or reduced-sodium). Regular canned tomato sauce and   paste (not low-sodium or  reduced-sodium). Regular tomato and vegetable juice (not low-sodium or reduced-sodium). Frozenvegetables in sauces. Grains Instant hot cereals. Bread stuffing, pancake, and biscuit mixes. Croutons. Seasoned rice or pasta mixes. Noodle soup cups. Boxed or frozen macaroni andcheese. Regular salted crackers. Self-rising flour. Meats and other proteins Meat or fish that is salted, canned, smoked, spiced, or pickled. Precooked or cured meat, such as sausages or meat loaves. Bacon. Ham. Pepperoni. Hot dogs. Corned beef. Chipped beef. Salt pork. Jerky. Pickled herring. Anchovies andsardines. Regular canned tuna. Salted nuts. Dairy Processed cheese and cheese spreads. Hard cheeses. Cheese curds. Blue cheese.Feta cheese. String cheese. Regular cottage cheese. Buttermilk. Canned milk. Fats and oils Salted butter. Regular margarine. Ghee. Bacon fat. Seasonings and condiments Onion salt, garlic salt, seasoned salt, table salt, and sea salt. Canned and packaged gravies. Worcestershire sauce. Tartar sauce. Barbecue sauce. Teriyaki sauce. Soy sauce, including reduced-sodium. Steak sauce. Fish sauce. Oyster sauce. Cocktail sauce. Horseradish that you find on the shelf. Regular ketchup and mustard. Meat flavorings and tenderizers. Bouillon cubes. Hot sauce. Pre-made or packaged marinades. Pre-made or packaged taco seasonings. Relishes.Regular salad dressings. Salsa. Other foods Salted popcorn and pretzels. Corn chips and puffs. Potato and tortilla chips.Canned or dried soups. Pizza. Frozen entrees and pot pies. The items listed above may not be a complete list of foods and beverages you should avoid. Contact a dietitian for more information. Summary Eating less sodium can help lower your blood pressure, reduce swelling, and protect your heart, liver, and kidneys. Most people on this plan should limit their sodium intake to 1,500-2,000 mg (milligrams) of sodium each day. Canned, boxed, and frozen foods are high in  sodium. Restaurant foods, fast foods, and pizza are also very high in sodium. You also get sodium by adding salt to food. Try to cook at home, eat more fresh fruits and vegetables, and eat less fast food and canned, processed, or prepared foods. This information is not intended to replace advice given to you by your health care provider. Make sure you discuss any questions you have with your healthcare provider. Document Revised: 02/26/2019 Document Reviewed: 12/23/2018 [employer] Patient Education  2022 [employer]  

## 2020-11-16 ENCOUNTER — Encounter: Payer: Self-pay | Admitting: Nurse Practitioner

## 2020-11-16 ENCOUNTER — Other Ambulatory Visit: Payer: Self-pay

## 2020-11-16 ENCOUNTER — Ambulatory Visit (INDEPENDENT_AMBULATORY_CARE_PROVIDER_SITE_OTHER): Payer: Medicare HMO | Admitting: Nurse Practitioner

## 2020-11-16 VITALS — BP 110/60 | HR 105 | Ht 64.0 in | Wt 104.5 lb

## 2020-11-16 DIAGNOSIS — I5032 Chronic diastolic (congestive) heart failure: Secondary | ICD-10-CM | POA: Diagnosis not present

## 2020-11-16 DIAGNOSIS — I4819 Other persistent atrial fibrillation: Secondary | ICD-10-CM | POA: Diagnosis not present

## 2020-11-16 DIAGNOSIS — E782 Mixed hyperlipidemia: Secondary | ICD-10-CM | POA: Diagnosis not present

## 2020-11-16 DIAGNOSIS — I10 Essential (primary) hypertension: Secondary | ICD-10-CM | POA: Diagnosis not present

## 2020-11-16 DIAGNOSIS — Z79899 Other long term (current) drug therapy: Secondary | ICD-10-CM

## 2020-11-16 DIAGNOSIS — I5022 Chronic systolic (congestive) heart failure: Secondary | ICD-10-CM

## 2020-11-16 MED ORDER — AMIODARONE HCL 200 MG PO TABS: 200.0000 mg | ORAL_TABLET | Freq: Two times a day (BID) | ORAL | 1 refills | Status: DC

## 2020-11-16 NOTE — Progress Notes (Signed)
  Office Visit    Patient Name: Angela Singh Date of Encounter: 11/16/2020  Primary Care Provider:  Bender, Abby Daneele, MD Primary Cardiologist:  Timothy Gollan, MD  Chief Complaint    70-year-old female with history of hypertension, hyperlipidemia, tobacco abuse, COPD, and GERD, who presents for follow-up related to recurrent atrial fibrillation and heart failure with midrange ejection fraction (EF 45-50% September 2022).  Past Medical History    Past Medical History:  Diagnosis Date   Chronic combined systolic (congestive) and diastolic (congestive) heart failure (HCC)    a. 09/2020 Echo: EF 60-65%. No rwma. Nl RV fxn. RVSP 38.2mmHg. Mild to mod MR. Mod TR; b. 10/2020 TEE: EF 45-50%, no rwma, nl RV fxn, RVSP 29.2mmHg, mild to mod MR/TR, Ao sclerosis, small PFO w/ R->L shunting. Mod dil LA.   COPD (chronic obstructive pulmonary disease) (HCC)    Essential hypertension    GERD (gastroesophageal reflux disease)    Mitral regurgitation    Mixed hyperlipidemia    Persistent atrial fibrillation (HCC)    a. Dx 09/2020. CHA2DS2VASc = 3-->Eliquis; b. 10/26/2020 TEE/DCCV: No LA thrombus-->successful DCCV; c. 10/2020 Recurrent Afib-->amio load-->DCCV-->recurrent afib.   Thrombus of left atrial appendage    a. 09/2020 noted on TEE in setting of Afib-->eliquis; b. 10/2020 TEE: no LA thrombus.   Tobacco abuse    Tricuspid regurgitation    Past Surgical History:  Procedure Laterality Date   CARDIOVERSION N/A 10/26/2020   Procedure: CARDIOVERSION;  Surgeon: Gollan, Timothy J, MD;  Location: [facility];  Service: Cardiovascular;  Laterality: N/A;   CARDIOVERSION N/A 11/01/2020   Procedure: CARDIOVERSION;  Surgeon: Agbor-Etang, Brian, MD;  Location: [facility];  Service: Cardiovascular;  Laterality: N/A;   TEE WITHOUT CARDIOVERSION N/A 09/25/2020   Procedure: TRANSESOPHAGEAL ECHOCARDIOGRAM (TEE);  Surgeon: Arida, Muhammad A, MD;  Location: [facility];  Service: Cardiovascular;  Laterality: N/A;   TEE  WITHOUT CARDIOVERSION N/A 10/26/2020   Procedure: TRANSESOPHAGEAL ECHOCARDIOGRAM (TEE);  Surgeon: Gollan, Timothy J, MD;  Location: [facility];  Service: Cardiovascular;  Laterality: N/A;    Allergies  Allergies  Allergen Reactions   Codeine Other (See Comments)    weakness    History of Present Illness    70-year-old female with history of hypertension, hyperlipidemia, tobacco abuse, COPD, and GERD,  atrial fibrillation and heart failure with midrange ejection fraction (EF 45-50% September 2022). In August 2022, she noted progressive dyspnea, cough, and congestion. She was subsequently evaluated by EMS and found to be tachycardiac in A. Fib RVR. She was seen in [place] [facility] and noted to have mild pulmonary edema with trace bilateral pleural effusions. She initially rate controlled on IV diltiazem, IV amiodarone, IV digoxin, and oral beta-blocker. Echo showed an EF of 60 to 65% with mild to moderate mitral regurgitation. She required diuresis. A TEE was performed with plan for cardioversion on August 22 however, she was noted to have a left atrial Penders thrombus. Amiodarone was discontinued and oral diltiazem was added to beta-blocker and digoxin. Follow-up labs in September 7 showed a digoxin level of 2.7 and she was referred to the emergency department. There, she was in A. Fib with a slow response in the 50s. She was relatively hypotensive. She was hypokalemic and also noted to have mild elevation of AST and ALT (63 and 84 respectively). Potassium was repleted and she was advised to discontinue diltiazem and digoxin. At follow-up office visit on September 19, she complained of fatigue. She was in A. Fib with RVR. Arrangements   were made for TEE and cardioversion, which were performed on September 22. TEE did not show left atrial appendage thrombus and cardioversion was successful. Unfortunately, she was admitted September 25 with recurrent A. Fib with RVR and heart failure. She was diuresed and  loaded with IV amiodarone. She underwent repeat cardioversion on September 28 however, reverted back to atrial fibrillation by September 29. She was switched to oral amiodarone to oral diltiazem and metoprolol, and subsequently discharged with plan for repeat cardioversion following 3-week amiodarone load.   Since hospitalization, she has been feeling fatigued. After her first cardioversion she felt "like a new person" but, since going back into A fib she has felt tired. She believes her bilateral edema has resolved and is breathing much better. She denies chest pain, palpitations, pnd, orthopnea, n, v, dizziness, syncope, edema, weight gain, or early satiety.   Home Medications    Current Outpatient Medications  Medication Sig Dispense Refill   albuterol (VENTOLIN HFA) 108 (90 Base) MCG/ACT inhaler Inhale 2 puffs into the lungs every 4 (four) hours as needed.     amiodarone (PACERONE) 200 MG tablet Take 1 tablet (200 mg total) by mouth 2 (two) times daily. 60 tablet 1   apixaban (ELIQUIS) 5 MG TABS tablet Take 1 tablet (5 mg total) by mouth 2 (two) times daily. 180 tablet 0   ascorbic acid (VITAMIN C) 500 MG tablet Take 1,000 mg by mouth 2 (two) times daily.     atorvastatin (LIPITOR) 20 MG tablet Take 20 mg by mouth at bedtime.     budesonide-formoterol (SYMBICORT) 160-4.5 MCG/ACT inhaler Inhale 1 puff into the lungs 2 (two) times daily in the am and at bedtime.Marland Kitchen     diltiazem (CARDIZEM CD) 180 MG 24 hr capsule Take 1 capsule (180 mg total) by mouth daily. 90 capsule 0   FLUoxetine (PROZAC) 40 MG capsule Take 40 mg by mouth daily.     furosemide (LASIX) 40 MG tablet Take 1 tablet (40 mg total) by mouth 2 (two) times daily. 60 tablet 0   metoprolol tartrate (LOPRESSOR) 50 MG tablet Take 1 tablet (50 mg total) by mouth 2 (two) times daily. Increased from 25 mg 2 times daily. 60 tablet 0   Multiple Vitamins-Minerals (ONE-A-DAY WOMENS 50+ PO) Take 1 tablet by mouth daily.     pantoprazole (PROTONIX)  40 MG tablet Take 40 mg by mouth daily.     potassium chloride SA (KLOR-CON) 20 MEQ tablet Take 1 tablet (20 mEq total) by mouth daily. 90 tablet 0   vitamin E 180 MG (400 UNITS) capsule Take 400 Units by mouth 2 (two) times daily.     nicotine (NICODERM CQ - DOSED IN MG/24 HOURS) 21 mg/24hr patch Place 1 patch (21 mg total) onto the skin daily. (Patient not taking: Reported on 11/16/2020) 28 patch 0   No current facility-administered medications for this visit.    Family History    Family History  Problem Relation Age of Onset   COPD Mother    Heart failure Father    Heart disease Sister       Social History    Social History   Socioeconomic History   Marital status: Divorced    Spouse name: Not on file   Number of children: Not on file   Years of education: Not on file   Highest education level: Not on file  Occupational History   Not on file  Tobacco Use   Smoking status: Every Day  Packs/day: 0.50    Years: 48.00    Pack years: 24.00    Types: Cigarettes   Smokeless tobacco: Never  Vaping Use   Vaping Use: Never used  Substance and Sexual Activity   Alcohol use: Not Currently   Drug use: Never   Sexual activity: Not on file  Other Topics Concern   Not on file  Social History Narrative   Lives locally.  Does not routinely exercise.   Social Determinants of Health   Financial Resource Strain: Not on file  Food Insecurity: Not on file  Transportation Needs: Not on file  Physical Activity: Not on file  Stress: Not on file  Social Connections: Not on file  Intimate Partner Violence: Not on file     Review of Systems    Positive for fatigue She denies chest pain, palpitations, pnd, orthopnea, n, v, dizziness, syncope, edema, weight gain, or early satiety.  All other systems reviewed and are otherwise negative except as noted above.  Physical Exam    VS:  BP 110/60 (BP Location: Left Arm, Patient Position: Sitting, Cuff Size: Normal)   Pulse (!) 105    Ht 5\' 4"  (1.626 m)   Wt 104 lb 8 oz (47.4 kg)   SpO2 96%   BMI 17.94 kg/m  , BMI Body mass index is 17.94 kg/m.     GEN: Well nourished, well developed, in no acute distress. HEENT: normal. Neck: Supple, no JVD, carotid bruits, or masses. Cardiac: IR, IR, no murmurs, rubs, or gallops. No clubbing, cyanosis, edema.  Radials/PT 2+ and equal bilaterally.  Respiratory:  Respirations regular and unlabored, diminished to auscultation bilaterally. GI: Soft, nontender, nondistended, BS + x 4. MS: no deformity or atrophy. Skin: warm and dry, no rash. Neuro:  Strength and sensation are intact. Psych: Normal affect.  Accessory Clinical Findings    ECG personally reviewed by me today - A. Fib. HR 105 bpm, non-specific ST/T changes - no acute changes.  Lab Results  Component Value Date   WBC 9.6 11/03/2020   HGB 14.0 11/03/2020   HCT 40.5 11/03/2020   MCV 98.3 11/03/2020   PLT 189 11/03/2020   Lab Results  Component Value Date   CREATININE 0.89 11/03/2020   BUN 30 (H) 11/03/2020   NA 140 11/03/2020   K 5.0 11/03/2020   CL 101 11/03/2020   CO2 33 (H) 11/03/2020   Lab Results  Component Value Date   ALT 121 (H) 11/01/2020   AST 96 (H) 11/01/2020   ALKPHOS 71 11/01/2020   BILITOT 0.9 11/01/2020   Lab Results  Component Value Date   CHOL 161 10/13/2020   HDL 54 10/13/2020   LDLCALC 93 10/13/2020   TRIG 68 10/13/2020   CHOLHDL 3.0 10/13/2020    Lab Results  Component Value Date   HGBA1C 6.0 (H) 10/12/2020    Assessment & Plan    1. Persistent Atrial Fibrillation: New onset in August of 2022. The patient has been cardioverted twice with recurrent conversion back to A. Fib. She has completed her amiodarone load and will be set up for cardioversion either Thursday or Friday of next week. Amiodarone dose changed from 400 mg BID to 200 mg BID. Continue abixaban, metoprolol, and diltiazem. Talked about setting up outpatient sleep study for evaluation of possible OSA - can pursue  following DCCV. TSH and CMET ordered for anticipated cardioversion and to f/u since initiation of amio.  If she is to remain on amio long term, will need PFTs  in the future.  2. Chronic HF with preserved ejection fraction: Euvolemic on exam. BP stable.  Continue on lasix. Continue diltiazem and metoprolol for rate control.   3. Tobacco abuse/COPD: Continues to smoke. Complete cessation advised.   Not currently wheezing.  4. Hyperlipidemia: LDL of 93 on statin therapy.   5. Disposition: plan for cardioversion next week. Labs drawn in clinic today. Follow-up 1-2 week post cardioversion    Nicolasa Ducking, NP 11/16/2020, 5:08 PM

## 2020-11-16 NOTE — H&P (View-Only) (Signed)
Office Visit    Patient Name: Angela Singh Date of Encounter: 11/16/2020  Primary Care Provider:  Oswaldo Conroy, MD Primary Cardiologist:  Julien Nordmann, MD  Chief Complaint    70 year old female with history of hypertension, hyperlipidemia, tobacco abuse, COPD, and GERD, who presents for follow-up related to recurrent atrial fibrillation and heart failure with midrange ejection fraction (EF 45-50% September 2022).  Past Medical History    Past Medical History:  Diagnosis Date   Chronic combined systolic (congestive) and diastolic (congestive) heart failure (HCC)    a. 09/2020 Echo: EF 60-65%. No rwma. Nl RV fxn. RVSP 38.24mmHg. Mild to mod MR. Mod TR; b. 10/2020 TEE: EF 45-50%, no rwma, nl RV fxn, RVSP 29.50mmHg, mild to mod MR/TR, Ao sclerosis, small PFO w/ R->L shunting. Mod dil LA.   COPD (chronic obstructive pulmonary disease) (HCC)    Essential hypertension    GERD (gastroesophageal reflux disease)    Mitral regurgitation    Mixed hyperlipidemia    Persistent atrial fibrillation (HCC)    a. Dx 09/2020. CHA2DS2VASc = 3-->Eliquis; b. 10/26/2020 TEE/DCCV: No LA thrombus-->successful DCCV; c. 10/2020 Recurrent Afib-->amio load-->DCCV-->recurrent afib.   Thrombus of left atrial appendage    a. 09/2020 noted on TEE in setting of Afib-->eliquis; b. 10/2020 TEE: no LA thrombus.   Tobacco abuse    Tricuspid regurgitation    Past Surgical History:  Procedure Laterality Date   CARDIOVERSION N/A 10/26/2020   Procedure: CARDIOVERSION;  Surgeon: Antonieta Iba, MD;  Location: [facility];  Service: Cardiovascular;  Laterality: N/A;   CARDIOVERSION N/A 11/01/2020   Procedure: CARDIOVERSION;  Surgeon: Debbe Odea, MD;  Location: [facility];  Service: Cardiovascular;  Laterality: N/A;   TEE WITHOUT CARDIOVERSION N/A 09/25/2020   Procedure: TRANSESOPHAGEAL ECHOCARDIOGRAM (TEE);  Surgeon: Iran Ouch, MD;  Location: [facility];  Service: Cardiovascular;  Laterality: N/A;   TEE  WITHOUT CARDIOVERSION N/A 10/26/2020   Procedure: TRANSESOPHAGEAL ECHOCARDIOGRAM (TEE);  Surgeon: Antonieta Iba, MD;  Location: [facility];  Service: Cardiovascular;  Laterality: N/A;    Allergies  Allergies  Allergen Reactions   Codeine Other (See Comments)    weakness    History of Present Illness    70 year old female with history of hypertension, hyperlipidemia, tobacco abuse, COPD, and GERD,  atrial fibrillation and heart failure with midrange ejection fraction (EF 45-50% September 2022). In August 2022, she noted progressive dyspnea, cough, and congestion. She was subsequently evaluated by EMS and found to be tachycardiac in A. Fib RVR. She was seen in [place] [facility] and noted to have mild pulmonary edema with trace bilateral pleural effusions. She initially rate controlled on IV diltiazem, IV amiodarone, IV digoxin, and oral beta-blocker. Echo showed an EF of 60 to 65% with mild to moderate mitral regurgitation. She required diuresis. A TEE was performed with plan for cardioversion on August 22 however, she was noted to have a left atrial Penders thrombus. Amiodarone was discontinued and oral diltiazem was added to beta-blocker and digoxin. Follow-up labs in September 7 showed a digoxin level of 2.7 and she was referred to the emergency department. There, she was in A. Fib with a slow response in the 50s. She was relatively hypotensive. She was hypokalemic and also noted to have mild elevation of AST and ALT (63 and 84 respectively). Potassium was repleted and she was advised to discontinue diltiazem and digoxin. At follow-up office visit on September 19, she complained of fatigue. She was in A. Fib with RVR. Arrangements  were made for TEE and cardioversion, which were performed on September 22. TEE did not show left atrial appendage thrombus and cardioversion was successful. Unfortunately, she was admitted September 25 with recurrent A. Fib with RVR and heart failure. She was diuresed and  loaded with IV amiodarone. She underwent repeat cardioversion on September 28 however, reverted back to atrial fibrillation by September 29. She was switched to oral amiodarone to oral diltiazem and metoprolol, and subsequently discharged with plan for repeat cardioversion following 3-week amiodarone load.   Since hospitalization, she has been feeling fatigued. After her first cardioversion she felt "like a new person" but, since going back into A fib she has felt tired. She believes her bilateral edema has resolved and is breathing much better. She denies chest pain, palpitations, pnd, orthopnea, n, v, dizziness, syncope, edema, weight gain, or early satiety.   Home Medications    Current Outpatient Medications  Medication Sig Dispense Refill   albuterol (VENTOLIN HFA) 108 (90 Base) MCG/ACT inhaler Inhale 2 puffs into the lungs every 4 (four) hours as needed.     amiodarone (PACERONE) 200 MG tablet Take 1 tablet (200 mg total) by mouth 2 (two) times daily. 60 tablet 1   apixaban (ELIQUIS) 5 MG TABS tablet Take 1 tablet (5 mg total) by mouth 2 (two) times daily. 180 tablet 0   ascorbic acid (VITAMIN C) 500 MG tablet Take 1,000 mg by mouth 2 (two) times daily.     atorvastatin (LIPITOR) 20 MG tablet Take 20 mg by mouth at bedtime.     budesonide-formoterol (SYMBICORT) 160-4.5 MCG/ACT inhaler Inhale 1 puff into the lungs 2 (two) times daily in the am and at bedtime.Marland Kitchen     diltiazem (CARDIZEM CD) 180 MG 24 hr capsule Take 1 capsule (180 mg total) by mouth daily. 90 capsule 0   FLUoxetine (PROZAC) 40 MG capsule Take 40 mg by mouth daily.     furosemide (LASIX) 40 MG tablet Take 1 tablet (40 mg total) by mouth 2 (two) times daily. 60 tablet 0   metoprolol tartrate (LOPRESSOR) 50 MG tablet Take 1 tablet (50 mg total) by mouth 2 (two) times daily. Increased from 25 mg 2 times daily. 60 tablet 0   Multiple Vitamins-Minerals (ONE-A-DAY WOMENS 50+ PO) Take 1 tablet by mouth daily.     pantoprazole (PROTONIX)  40 MG tablet Take 40 mg by mouth daily.     potassium chloride SA (KLOR-CON) 20 MEQ tablet Take 1 tablet (20 mEq total) by mouth daily. 90 tablet 0   vitamin E 180 MG (400 UNITS) capsule Take 400 Units by mouth 2 (two) times daily.     nicotine (NICODERM CQ - DOSED IN MG/24 HOURS) 21 mg/24hr patch Place 1 patch (21 mg total) onto the skin daily. (Patient not taking: Reported on 11/16/2020) 28 patch 0   No current facility-administered medications for this visit.    Family History    Family History  Problem Relation Age of Onset   COPD Mother    Heart failure Father    Heart disease Sister       Social History    Social History   Socioeconomic History   Marital status: Divorced    Spouse name: Not on file   Number of children: Not on file   Years of education: Not on file   Highest education level: Not on file  Occupational History   Not on file  Tobacco Use   Smoking status: Every Day  Packs/day: 0.50    Years: 48.00    Pack years: 24.00    Types: Cigarettes   Smokeless tobacco: Never  Vaping Use   Vaping Use: Never used  Substance and Sexual Activity   Alcohol use: Not Currently   Drug use: Never   Sexual activity: Not on file  Other Topics Concern   Not on file  Social History Narrative   Lives locally.  Does not routinely exercise.   Social Determinants of Health   Financial Resource Strain: Not on file  Food Insecurity: Not on file  Transportation Needs: Not on file  Physical Activity: Not on file  Stress: Not on file  Social Connections: Not on file  Intimate Partner Violence: Not on file     Review of Systems    Positive for fatigue She denies chest pain, palpitations, pnd, orthopnea, n, v, dizziness, syncope, edema, weight gain, or early satiety.  All other systems reviewed and are otherwise negative except as noted above.  Physical Exam    VS:  BP 110/60 (BP Location: Left Arm, Patient Position: Sitting, Cuff Size: Normal)   Pulse (!) 105    Ht 5\' 4"  (1.626 m)   Wt 104 lb 8 oz (47.4 kg)   SpO2 96%   BMI 17.94 kg/m  , BMI Body mass index is 17.94 kg/m.     GEN: Well nourished, well developed, in no acute distress. HEENT: normal. Neck: Supple, no JVD, carotid bruits, or masses. Cardiac: IR, IR, no murmurs, rubs, or gallops. No clubbing, cyanosis, edema.  Radials/PT 2+ and equal bilaterally.  Respiratory:  Respirations regular and unlabored, diminished to auscultation bilaterally. GI: Soft, nontender, nondistended, BS + x 4. MS: no deformity or atrophy. Skin: warm and dry, no rash. Neuro:  Strength and sensation are intact. Psych: Normal affect.  Accessory Clinical Findings    ECG personally reviewed by me today - A. Fib. HR 105 bpm, non-specific ST/T changes - no acute changes.  Lab Results  Component Value Date   WBC 9.6 11/03/2020   HGB 14.0 11/03/2020   HCT 40.5 11/03/2020   MCV 98.3 11/03/2020   PLT 189 11/03/2020   Lab Results  Component Value Date   CREATININE 0.89 11/03/2020   BUN 30 (H) 11/03/2020   NA 140 11/03/2020   K 5.0 11/03/2020   CL 101 11/03/2020   CO2 33 (H) 11/03/2020   Lab Results  Component Value Date   ALT 121 (H) 11/01/2020   AST 96 (H) 11/01/2020   ALKPHOS 71 11/01/2020   BILITOT 0.9 11/01/2020   Lab Results  Component Value Date   CHOL 161 10/13/2020   HDL 54 10/13/2020   LDLCALC 93 10/13/2020   TRIG 68 10/13/2020   CHOLHDL 3.0 10/13/2020    Lab Results  Component Value Date   HGBA1C 6.0 (H) 10/12/2020    Assessment & Plan    1. Persistent Atrial Fibrillation: New onset in August of 2022. The patient has been cardioverted twice with recurrent conversion back to A. Fib. She has completed her amiodarone load and will be set up for cardioversion either Thursday or Friday of next week. Amiodarone dose changed from 400 mg BID to 200 mg BID. Continue abixaban, metoprolol, and diltiazem. Talked about setting up outpatient sleep study for evaluation of possible OSA - can pursue  following DCCV. TSH and CMET ordered for anticipated cardioversion and to f/u since initiation of amio.  If she is to remain on amio long term, will need PFTs  in the future.  2. Chronic HF with preserved ejection fraction: Euvolemic on exam. BP stable.  Continue on lasix. Continue diltiazem and metoprolol for rate control.   3. Tobacco abuse/COPD: Continues to smoke. Complete cessation advised.   Not currently wheezing.  4. Hyperlipidemia: LDL of 93 on statin therapy.   5. Disposition: plan for cardioversion next week. Labs drawn in clinic today. Follow-up 1-2 week post cardioversion    Nicolasa Ducking, NP 11/16/2020, 5:08 PM

## 2020-11-16 NOTE — Patient Instructions (Addendum)
Medication Instructions:  - Your physician has recommended you make the following change in your medication:   1) DECREASE amiodarone to 200 mg: - take 1 tablet by mouth TWICE daily   *If you need a refill on your cardiac medications before your next appointment, please call your pharmacy*   Lab Work: - Your physician recommends that you have lab work today: CMET/ TSH  If you have labs (blood work) drawn today and your tests are completely normal, you will receive your results only by: MyChart Message (if you have MyChart) OR A paper copy in the mail If you have any lab test that is abnormal or we need to change your treatment, we will call you to review the results.   Testing/Procedures: - Your physician has recommended that you have a Cardioversion (DCCV). Electrical Cardioversion uses a jolt of electricity to your heart either through paddles or wired patches attached to your chest. This is a controlled, usually prescheduled, procedure. Defibrillation is done under light anesthesia in the hospital, and you usually go home the day of the procedure. This is done to get your heart back into a normal rhythm. You are not awake for the procedure.   You are scheduled for a Cardioversion on Thursday 11/23/20 with Dr. Mariah Milling.  Please arrive at the [facility] at 6:30 a.m. on the day of your procedure.  Once you enter the [facility], proceed to the 1st desk on the right to check in Advertising account executive)  DIET INSTRUCTIONS:  Nothing to eat or drink after midnight the night prior to your procedure         Labs: today  Medications:  You may take all of your regular medications the morning of your procedure with enough water to get them down safely.  - HOLD lasix (furosemide) the morning of your test  Must have a responsible person to drive you home.  Bring a current list of your medications and current insurance cards.    If you have any questions after you get home, please  call the office at [phone]    Follow-Up: At [facility], you and your health needs are our priority.  As part of our continuing mission to provide you with exceptional heart care, we have created designated Provider Care Teams.  These Care Teams include your primary Cardiologist (physician) and Advanced Practice Providers (APPs -  Physician Assistants and Nurse Practitioners) who all work together to provide you with the care you need, when you need it.  We recommend signing up for the patient portal called "MyChart".  Sign up information is provided on this After Visit Summary.  MyChart is used to connect with patients for Virtual Visits (Telemedicine).  Patients are able to view lab/test results, encounter notes, upcoming appointments, etc.  Non-urgent messages can be sent to your provider as well.   To learn more about what you can do with MyChart, go to [email or web address].    Your next appointment:   3 week(s)  The format for your next appointment:   In Person  Provider:   You may see Julien Nordmann, MD or one of the following Advanced Practice Providers on your designated Care Team:   Nicolasa Ducking, NP    Other Instructions  Electrical Cardioversion Electrical cardioversion is the delivery of a jolt of electricity to restore a normal rhythm to the heart. A rhythm that is too fast or is not regular keeps the heart from pumping well. In this procedure,  sticky patches or metal paddles are placed on the chest to deliver electricity to the heart from a device. This procedure may be done in an emergency if: There is low or no blood pressure as a result of the heart rhythm. Normal rhythm must be restored as fast as possible to protect the brain and heart from further damage. It may save a life. This may also be a scheduled procedure for irregular or fast heart rhythms that are not immediately life-threatening. Tell a health care provider about: Any allergies you have. All  medicines you are taking, including vitamins, herbs, eye drops, creams, and over-the-counter medicines. Any problems you or family members have had with anesthetic medicines. Any blood disorders you have. Any surgeries you have had. Any medical conditions you have. Whether you are pregnant or may be pregnant. What are the risks? Generally, this is a safe procedure. However, problems may occur, including: Allergic reactions to medicines. A blood clot that breaks free and travels to other parts of your body. The possible return of an abnormal heart rhythm within hours or days after the procedure. Your heart stopping (cardiac arrest). This is rare. What happens before the procedure? Medicines Your health care provider may have you start taking: Blood-thinning medicines (anticoagulants) so your blood does not clot as easily. Medicines to help stabilize your heart rate and rhythm. Ask your health care provider about: Changing or stopping your regular medicines. This is especially important if you are taking diabetes medicines or blood thinners. Taking medicines such as aspirin and ibuprofen. These medicines can thin your blood. Do not take these medicines unless your health care provider tells you to take them. Taking over-the-counter medicines, vitamins, herbs, and supplements. General instructions Follow instructions from your health care provider about eating or drinking restrictions. Plan to have someone take you home from the hospital or clinic. If you will be going home right after the procedure, plan to have someone with you for 24 hours. Ask your health care provider what steps will be taken to help prevent infection. These may include washing your skin with a germ-killing soap. What happens during the procedure?  An IV will be inserted into one of your veins. Sticky patches (electrodes) or metal paddles may be placed on your chest. You will be given a medicine to help you relax  (sedative). An electrical shock will be delivered. The procedure may vary among health care providers and hospitals. What can I expect after the procedure? Your blood pressure, heart rate, breathing rate, and blood oxygen level will be monitored until you leave the hospital or clinic. Your heart rhythm will be watched to make sure it does not change. You may have some redness on the skin where the shocks were given. Follow these instructions at home: Do not drive for 24 hours if you were given a sedative during your procedure. Take over-the-counter and prescription medicines only as told by your health care provider. Ask your health care provider how to check your pulse. Check it often. Rest for 48 hours after the procedure or as told by your health care provider. Avoid or limit your caffeine use as told by your health care provider. Keep all follow-up visits as told by your health care provider. This is important. Contact a health care provider if: You feel like your heart is beating too quickly or your pulse is not regular. You have a serious muscle cramp that does not go away. Get help right away if: You have discomfort  in your chest. You are dizzy or you feel faint. You have trouble breathing or you are short of breath. Your speech is slurred. You have trouble moving an arm or leg on one side of your body. Your fingers or toes turn cold or blue. Summary Electrical cardioversion is the delivery of a jolt of electricity to restore a normal rhythm to the heart. This procedure may be done right away in an emergency or may be a scheduled procedure if the condition is not an emergency. Generally, this is a safe procedure. After the procedure, check your pulse often as told by your health care provider. This information is not intended to replace advice given to you by your health care provider. Make sure you discuss any questions you have with your health care provider. Document Revised:  08/24/2018 Document Reviewed: 08/24/2018 Elsevier Patient Education  2022 [employer].

## 2020-11-17 LAB — COMPREHENSIVE METABOLIC PANEL
ALT: 125 IU/L — ABNORMAL HIGH (ref 0–32)
AST: 111 IU/L — ABNORMAL HIGH (ref 0–40)
Albumin/Globulin Ratio: 1.9 (ref 1.2–2.2)
Albumin: 4.1 g/dL (ref 3.8–4.8)
Alkaline Phosphatase: 105 IU/L (ref 44–121)
BUN/Creatinine Ratio: 16 (ref 12–28)
BUN: 17 mg/dL (ref 8–27)
Bilirubin Total: 0.6 mg/dL (ref 0.0–1.2)
CO2: 26 mmol/L (ref 20–29)
Calcium: 8.7 mg/dL (ref 8.7–10.3)
Chloride: 94 mmol/L — ABNORMAL LOW (ref 96–106)
Creatinine, Ser: 1.08 mg/dL — ABNORMAL HIGH (ref 0.57–1.00)
Globulin, Total: 2.2 g/dL (ref 1.5–4.5)
Glucose: 98 mg/dL (ref 70–99)
Potassium: 3.4 mmol/L — ABNORMAL LOW (ref 3.5–5.2)
Sodium: 142 mmol/L (ref 134–144)
Total Protein: 6.3 g/dL (ref 6.0–8.5)
eGFR: 56 mL/min/{1.73_m2} — ABNORMAL LOW (ref >59)

## 2020-11-17 LAB — TSH: TSH: 5.13 u[IU]/mL — ABNORMAL HIGH (ref 0.450–4.500)

## 2020-11-21 ENCOUNTER — Encounter: Payer: Self-pay | Admitting: *Deleted

## 2020-11-21 ENCOUNTER — Telehealth: Payer: Self-pay | Admitting: *Deleted

## 2020-11-21 DIAGNOSIS — E876 Hypokalemia: Secondary | ICD-10-CM

## 2020-11-21 DIAGNOSIS — R7989 Other specified abnormal findings of blood chemistry: Secondary | ICD-10-CM

## 2020-11-21 DIAGNOSIS — N179 Acute kidney failure, unspecified: Secondary | ICD-10-CM

## 2020-11-21 MED ORDER — FUROSEMIDE 40 MG PO TABS: 60.0000 mg | ORAL_TABLET | Freq: Every day | ORAL | 0 refills | Status: DC

## 2020-11-21 MED ORDER — POTASSIUM CHLORIDE CRYS ER 20 MEQ PO TBCR: 30.0000 meq | EXTENDED_RELEASE_TABLET | Freq: Every day | ORAL | 0 refills | Status: DC

## 2020-11-21 NOTE — Telephone Encounter (Signed)
Spoke with patient and when reviewing recommendations she was trying to find her pill bottles. She was not able to find her atorvastatin and states she has not been taking this. Reviewed all other instructions in detail and repeat labs as well. She verbalized understanding of all information and expressed importance of taking potassium and changing dose of her furosemide. Will update provider that she has not been taking atorvastatin at this time. She did request that I send her letter with these instructions as well. Advised that I would send that to her and if she should have any questions before then to please call me back.

## 2020-11-21 NOTE — Telephone Encounter (Signed)
-----   Message from Gibson Ramp, RN sent at 11/21/2020  2:12 PM EDT -----  ----- Message ----- From: Kennon Rounds Sent: 11/17/2020  12:44 PM EDT To: Mickie Bail Burl Triage  Covering for Flavia Shipper, NP while he is on vacation. Creatinine increased.  K+ low.  LFTs elevated but stable.  Total protein/albumin normal.  TSH elevated. Pt on Amiodarone but LFTs were elevated prior to recent restart.  Do not suspect elevated LFTs related to Amiodarone.  May be good to hold statin for now until LFTs improve.   Notes indicate volume stable at visit yesterday.  PLAN:  -Decrease Furosemide to 60 once daily -Weigh daily.  Call if wt increases > 3 lbs in 1 day. -Take extra K+ 40 mEq today only -Tomorrow, change daily K+ to 30 mEq once daily  -Hold Atorvastatin for now -Repeat CMET 1 week (order under Flavia Shipper, NP) - Dx: low K, elevated LFTs, AKI -Arrange repeat TSH with Free T4 in 4 weeks (order under Flavia Shipper, NP) Tereso Newcomer, PA-C    11/17/2020 12:29 PM

## 2020-11-22 NOTE — Telephone Encounter (Signed)
Letter completed and placed in outgoing mail.  °

## 2020-11-23 ENCOUNTER — Encounter
Admission: RE | Disposition: A | Discharge status: Home / Self Care | Payer: Self-pay | Source: Home / Self Care | Attending: Cardiovascular Disease

## 2020-11-23 ENCOUNTER — Ambulatory Visit: Payer: Medicare HMO | Admitting: Anesthesiology

## 2020-11-23 ENCOUNTER — Encounter: Payer: Self-pay | Admitting: Cardiovascular Disease

## 2020-11-23 ENCOUNTER — Ambulatory Visit
Admission: RE | Admit: 2020-11-23 | Discharge: 2020-11-23 | Disposition: A | Discharge status: Home / Self Care | Payer: Medicare HMO | Source: Home / Self Care | Attending: Cardiovascular Disease | Admitting: Cardiovascular Disease

## 2020-11-23 DIAGNOSIS — Z8249 Family history of ischemic heart disease and other diseases of the circulatory system: Secondary | ICD-10-CM | POA: Insufficient documentation

## 2020-11-23 DIAGNOSIS — K219 Gastro-esophageal reflux disease without esophagitis: Secondary | ICD-10-CM | POA: Insufficient documentation

## 2020-11-23 DIAGNOSIS — I5042 Chronic combined systolic (congestive) and diastolic (congestive) heart failure: Secondary | ICD-10-CM | POA: Insufficient documentation

## 2020-11-23 DIAGNOSIS — F17210 Nicotine dependence, cigarettes, uncomplicated: Secondary | ICD-10-CM | POA: Insufficient documentation

## 2020-11-23 DIAGNOSIS — I4819 Other persistent atrial fibrillation: Secondary | ICD-10-CM | POA: Insufficient documentation

## 2020-11-23 DIAGNOSIS — Z885 Allergy status to narcotic agent status: Secondary | ICD-10-CM | POA: Insufficient documentation

## 2020-11-23 DIAGNOSIS — Z7901 Long term (current) use of anticoagulants: Secondary | ICD-10-CM | POA: Insufficient documentation

## 2020-11-23 DIAGNOSIS — I110 Hypertensive heart disease with heart failure: Secondary | ICD-10-CM | POA: Insufficient documentation

## 2020-11-23 DIAGNOSIS — E782 Mixed hyperlipidemia: Secondary | ICD-10-CM | POA: Insufficient documentation

## 2020-11-23 DIAGNOSIS — J449 Chronic obstructive pulmonary disease, unspecified: Secondary | ICD-10-CM | POA: Insufficient documentation

## 2020-11-23 DIAGNOSIS — Z7951 Long term (current) use of inhaled steroids: Secondary | ICD-10-CM | POA: Insufficient documentation

## 2020-11-23 DIAGNOSIS — Z79899 Other long term (current) drug therapy: Secondary | ICD-10-CM | POA: Insufficient documentation

## 2020-11-23 DIAGNOSIS — R0602 Shortness of breath: Secondary | ICD-10-CM | POA: Diagnosis not present

## 2020-11-23 HISTORY — PX: CARDIOVERSION: SHX1299

## 2020-11-23 SURGERY — CARDIOVERSION
Anesthesia: General

## 2020-11-23 MED ORDER — MEPERIDINE HCL 25 MG/ML IJ SOLN: 6.2500 mg | INTRAMUSCULAR | Status: DC | PRN

## 2020-11-23 MED ORDER — FENTANYL CITRATE (PF) 100 MCG/2ML IJ SOLN: 25.0000 ug | INTRAMUSCULAR | Status: DC | PRN

## 2020-11-23 MED ORDER — PROPOFOL 10 MG/ML IV BOLUS
INTRAVENOUS | Status: DC | PRN
  Administered 2020-11-23: 10 mg via INTRAVENOUS
  Administered 2020-11-23: 30 mg via INTRAVENOUS
  Administered 2020-11-23: 10 mg via INTRAVENOUS

## 2020-11-23 MED ORDER — ONDANSETRON HCL 4 MG/2ML IJ SOLN: 4.0000 mg | Freq: Once | INTRAMUSCULAR | Status: DC | PRN

## 2020-11-23 MED ORDER — SODIUM CHLORIDE 0.9 % IV SOLN: INTRAVENOUS | Status: DC | PRN

## 2020-11-23 NOTE — Anesthesia Postprocedure Evaluation (Signed)
Anesthesia Post Note  Patient: Angela Singh  Procedure(s) Performed: CARDIOVERSION  Patient location during evaluation: Phase II Anesthesia Type: General Level of consciousness: awake and alert, awake and oriented Pain management: pain level controlled Vital Signs Assessment: post-procedure vital signs reviewed and stable Respiratory status: spontaneous breathing, nonlabored ventilation and respiratory function stable Cardiovascular status: blood pressure returned to baseline and stable Postop Assessment: no apparent nausea or vomiting Anesthetic complications: no   No notable events documented.   Last Vitals:  Vitals:   11/23/20 0815 11/23/20 0830  BP: (!) 97/47 94/60  Pulse: (!) 57 (!) 55  Resp: (!) 26 (!) 23  Temp:    SpO2: 100% 99%    Last Pain:  Vitals:   11/23/20 0830  TempSrc:   PainSc: 0-No pain                 Manfred Arch

## 2020-11-23 NOTE — CV Procedure (Signed)
Cardioversion procedure note For atrial fibrillation, persistent  Procedure Details:  Consent: Risks of procedure as well as the alternatives and risks of each were explained to the (patient/caregiver).  Consent for procedure obtained.  Time Out: Verified patient identification, verified procedure, site/side was marked, verified correct patient position, special equipment/implants available, medications/allergies/relevent history reviewed, required imaging and test results available.  Performed  Patient placed on cardiac monitor, pulse oximetry, supplemental oxygen as necessary.   Sedation given: propofol IV, Dr. Jaszewski Pacer pads placed anterior and posterior chest.   Cardioverted 1 time(s).   Cardioverted at  150J. Synchronized biphasic Converted to NSR   Evaluation: Findings: Post procedure EKG shows: NSR Complications: None Patient did tolerate procedure well.  Time Spent Directly with the Patient:  45 minutes   Tim Gavan Nordby, M.D., Ph.D.  

## 2020-11-23 NOTE — Anesthesia Preprocedure Evaluation (Signed)
Anesthesia Evaluation  Patient identified by MRN, date of birth, ID band Patient awake    Reviewed: Allergy & Precautions, NPO status , Patient's Chart, lab work & pertinent test results  History of Anesthesia Complications Negative for: history of anesthetic complications  Airway Mallampati: II  TM Distance: >3 FB Neck ROM: Full    Dental  (+) Poor Dentition, Missing   Pulmonary COPD, Current Smoker and Patient abstained from smoking.,    breath sounds clear to auscultation- rhonchi (-) wheezing      Cardiovascular hypertension, Pt. on medications +CHF  (-) CAD, (-) Past MI, (-) Cardiac Stents and (-) CABG + dysrhythmias Atrial Fibrillation  Rhythm:Regular Rate:Normal - Systolic murmurs and - Diastolic murmurs    Neuro/Psych neg Seizures PSYCHIATRIC DISORDERS Depression negative neurological ROS     GI/Hepatic Neg liver ROS, GERD  ,  Endo/Other  negative endocrine ROSneg diabetes  Renal/GU negative Renal ROS     Musculoskeletal negative musculoskeletal ROS (+)   Abdominal (+) - obese,   Peds  Hematology negative hematology ROS (+)   Anesthesia Other Findings Chronic combined systolic (congestive) and diastolic (congestive) heart failure (HCC)  a. 09/2020 Echo: EF 60-65%. No rwma. Nl RV fxn. RVSP 38.75mmHg. Mild to mod MR. Mod TR; b. 10/2020 TEE: EF 45-50%, no rwma, nl RV fxn, RVSP 29.65mmHg, mild to mod MR/TR, Ao sclerosis, small PFO w/ R->L shunting. Mod dil LA.  COPD (chronic obstructive pulmonary disease) (HCC)    Essential hypertension   GERD (gastroesophageal reflux disease) Mitral regurgitation    Mixed hyperlipidemia    Persistent atrial fibrillation (HCC)  a. Dx 09/2020. CHA2DS2VASc = 3-->Eliquis; b. 10/26/2020 TEE/DCCV: No LA thrombus-->successful DCCV; c. 10/2020 Recurrent Afib-->amio load-->DCCV-->recurrent afib.  Thrombus of left atrial appendage  a. 09/2020 noted on TEE in setting of Afib-->eliquis; b. 10/2020  TEE: no LA thrombus.  Tobacco abuse    Tricuspid regurgitation        Reproductive/Obstetrics                             Anesthesia Physical  Anesthesia Plan  ASA: 3  Anesthesia Plan: General   Post-op Pain Management:    Induction: Intravenous  PONV Risk Score and Plan: 1 and Propofol infusion  Airway Management Planned: Natural Airway  Additional Equipment:   Intra-op Plan:   Post-operative Plan:   Informed Consent: I have reviewed the patients History and Physical, chart, labs and discussed the procedure including the risks, benefits and alternatives for the proposed anesthesia with the patient or authorized representative who has indicated his/her understanding and acceptance.     Dental advisory given  Plan Discussed with: CRNA, Anesthesiologist and Surgeon  Anesthesia Plan Comments:         Anesthesia Quick Evaluation

## 2020-11-23 NOTE — Transfer of Care (Signed)
Immediate Anesthesia Transfer of Care Note  Patient: Angela Singh  Procedure(s) Performed: CARDIOVERSION  Patient Location: PACU and Special recoveries  Anesthesia Type:General  Level of Consciousness: drowsy and patient cooperative  Airway & Oxygen Therapy: Patient Spontanous Breathing and Patient connected to nasal cannula oxygen  Post-op Assessment: Report given to RN and Post -op Vital signs reviewed and stable  Post vital signs: Reviewed and stable  Last Vitals:  Vitals Value Taken Time  BP 93/63 11/23/20 0740  Temp    Pulse 49 11/23/20 0743  Resp 18 11/23/20 0743  SpO2 100 % 11/23/20 0743  Vitals shown include unvalidated device data.  Last Pain:  Vitals:   11/23/20 0702  TempSrc: Oral  PainSc: 0-No pain         Complications: No notable events documented.

## 2020-11-23 NOTE — Anesthesia Procedure Notes (Signed)
Procedure Name: MAC Date/Time: 11/23/2020 7:35 AM Performed by: Jerrye Noble, CRNA Pre-anesthesia Checklist: Patient identified, Emergency Drugs available, Suction available and Patient being monitored Patient Re-evaluated:Patient Re-evaluated prior to induction Oxygen Delivery Method: Nasal cannula

## 2020-11-25 ENCOUNTER — Emergency Department: Payer: Medicare HMO

## 2020-11-25 ENCOUNTER — Other Ambulatory Visit: Payer: Self-pay

## 2020-11-25 ENCOUNTER — Encounter: Payer: Self-pay | Admitting: Emergency Medicine

## 2020-11-25 ENCOUNTER — Inpatient Hospital Stay (HOSPITAL_COMMUNITY)
Admit: 2020-11-25 | Discharge: 2020-11-25 | Disposition: A | Discharge status: Home / Self Care | Payer: Medicare HMO | Attending: Internal Medicine | Admitting: Internal Medicine

## 2020-11-25 ENCOUNTER — Inpatient Hospital Stay
Admission: EM | Admit: 2020-11-25 | Discharge: 2020-11-27 | DRG: 291 | Disposition: A | Discharge status: Home / Self Care | Payer: Medicare HMO | Attending: Obstetrics and Gynecology | Admitting: Obstetrics and Gynecology

## 2020-11-25 DIAGNOSIS — E876 Hypokalemia: Secondary | ICD-10-CM | POA: Diagnosis present

## 2020-11-25 DIAGNOSIS — Z20822 Contact with and (suspected) exposure to covid-19: Secondary | ICD-10-CM | POA: Diagnosis present

## 2020-11-25 DIAGNOSIS — K219 Gastro-esophageal reflux disease without esophagitis: Secondary | ICD-10-CM | POA: Diagnosis present

## 2020-11-25 DIAGNOSIS — Z7951 Long term (current) use of inhaled steroids: Secondary | ICD-10-CM

## 2020-11-25 DIAGNOSIS — I5023 Acute on chronic systolic (congestive) heart failure: Secondary | ICD-10-CM

## 2020-11-25 DIAGNOSIS — I10 Essential (primary) hypertension: Secondary | ICD-10-CM

## 2020-11-25 DIAGNOSIS — I959 Hypotension, unspecified: Secondary | ICD-10-CM | POA: Diagnosis not present

## 2020-11-25 DIAGNOSIS — F32A Depression, unspecified: Secondary | ICD-10-CM

## 2020-11-25 DIAGNOSIS — Z716 Tobacco abuse counseling: Secondary | ICD-10-CM | POA: Diagnosis not present

## 2020-11-25 DIAGNOSIS — Z681 Body mass index (BMI) 19 or less, adult: Secondary | ICD-10-CM | POA: Diagnosis not present

## 2020-11-25 DIAGNOSIS — J449 Chronic obstructive pulmonary disease, unspecified: Secondary | ICD-10-CM

## 2020-11-25 DIAGNOSIS — R0902 Hypoxemia: Secondary | ICD-10-CM

## 2020-11-25 DIAGNOSIS — R0602 Shortness of breath: Secondary | ICD-10-CM | POA: Diagnosis present

## 2020-11-25 DIAGNOSIS — F17210 Nicotine dependence, cigarettes, uncomplicated: Secondary | ICD-10-CM | POA: Diagnosis present

## 2020-11-25 DIAGNOSIS — I513 Intracardiac thrombosis, not elsewhere classified: Secondary | ICD-10-CM

## 2020-11-25 DIAGNOSIS — E782 Mixed hyperlipidemia: Secondary | ICD-10-CM | POA: Diagnosis present

## 2020-11-25 DIAGNOSIS — I5020 Unspecified systolic (congestive) heart failure: Secondary | ICD-10-CM

## 2020-11-25 DIAGNOSIS — I110 Hypertensive heart disease with heart failure: Principal | ICD-10-CM | POA: Diagnosis present

## 2020-11-25 DIAGNOSIS — I4819 Other persistent atrial fibrillation: Secondary | ICD-10-CM | POA: Diagnosis present

## 2020-11-25 DIAGNOSIS — Z825 Family history of asthma and other chronic lower respiratory diseases: Secondary | ICD-10-CM | POA: Diagnosis not present

## 2020-11-25 DIAGNOSIS — J9601 Acute respiratory failure with hypoxia: Secondary | ICD-10-CM | POA: Diagnosis present

## 2020-11-25 DIAGNOSIS — I482 Chronic atrial fibrillation, unspecified: Secondary | ICD-10-CM

## 2020-11-25 DIAGNOSIS — Z720 Tobacco use: Secondary | ICD-10-CM | POA: Diagnosis not present

## 2020-11-25 DIAGNOSIS — I361 Nonrheumatic tricuspid (valve) insufficiency: Secondary | ICD-10-CM | POA: Diagnosis present

## 2020-11-25 DIAGNOSIS — Z885 Allergy status to narcotic agent status: Secondary | ICD-10-CM

## 2020-11-25 DIAGNOSIS — Z8249 Family history of ischemic heart disease and other diseases of the circulatory system: Secondary | ICD-10-CM

## 2020-11-25 DIAGNOSIS — Z7901 Long term (current) use of anticoagulants: Secondary | ICD-10-CM

## 2020-11-25 DIAGNOSIS — J9621 Acute and chronic respiratory failure with hypoxia: Secondary | ICD-10-CM | POA: Diagnosis not present

## 2020-11-25 DIAGNOSIS — E785 Hyperlipidemia, unspecified: Secondary | ICD-10-CM | POA: Diagnosis not present

## 2020-11-25 DIAGNOSIS — Z79899 Other long term (current) drug therapy: Secondary | ICD-10-CM | POA: Diagnosis not present

## 2020-11-25 DIAGNOSIS — I4891 Unspecified atrial fibrillation: Secondary | ICD-10-CM | POA: Diagnosis not present

## 2020-11-25 LAB — ECHOCARDIOGRAM COMPLETE
AR max vel: 1.78 cm2
AV Area VTI: 1.72 cm2
AV Area mean vel: 1.7 cm2
AV Mean grad: 2 mmHg
AV Peak grad: 3.2 mmHg
Ao pk vel: 0.89 m/s
Area-P 1/2: 3.99 cm2
Calc EF: 63.7 %
Height: 64 in
MV M vel: 4.18 m/s
MV Peak grad: 69.9 mmHg
MV VTI: 2.46 cm2
Radius: 0.7 cm
S' Lateral: 3.5 cm
Single Plane A2C EF: 64.7 %
Single Plane A4C EF: 59.4 %
Weight: 1744 oz

## 2020-11-25 LAB — CBC
HCT: 40.9 % (ref 36.0–46.0)
Hemoglobin: 14.1 g/dL (ref 12.0–15.0)
MCH: 34.2 pg — ABNORMAL HIGH (ref 26.0–34.0)
MCHC: 34.5 g/dL (ref 30.0–36.0)
MCV: 99.3 fL (ref 80.0–100.0)
Platelets: 186 10*3/uL (ref 150–400)
RBC: 4.12 MIL/uL (ref 3.87–5.11)
RDW: 12.9 % (ref 11.5–15.5)
WBC: 9.2 10*3/uL (ref 4.0–10.5)
nRBC: 0 % (ref 0.0–0.2)

## 2020-11-25 LAB — BRAIN NATRIURETIC PEPTIDE: B Natriuretic Peptide: 973.1 pg/mL — ABNORMAL HIGH (ref 0.0–100.0)

## 2020-11-25 LAB — TROPONIN I (HIGH SENSITIVITY)
Troponin I (High Sensitivity): 13 ng/L (ref <18)
Troponin I (High Sensitivity): 12 ng/L (ref <18)

## 2020-11-25 LAB — RESP PANEL BY RT-PCR (FLU A&B, COVID) ARPGX2
Influenza A by PCR: NEGATIVE
Influenza B by PCR: NEGATIVE
SARS Coronavirus 2 by RT PCR: NEGATIVE

## 2020-11-25 LAB — BASIC METABOLIC PANEL
Anion gap: 10 (ref 5–15)
BUN: 15 mg/dL (ref 8–23)
CO2: 29 mmol/L (ref 22–32)
Calcium: 8.9 mg/dL (ref 8.9–10.3)
Chloride: 95 mmol/L — ABNORMAL LOW (ref 98–111)
Creatinine, Ser: 1.03 mg/dL — ABNORMAL HIGH (ref 0.44–1.00)
GFR, Estimated: 59 mL/min — ABNORMAL LOW (ref >60)
Glucose, Bld: 160 mg/dL — ABNORMAL HIGH (ref 70–99)
Potassium: 4.1 mmol/L (ref 3.5–5.1)
Sodium: 134 mmol/L — ABNORMAL LOW (ref 135–145)

## 2020-11-25 LAB — PROTIME-INR
INR: 1.4 — ABNORMAL HIGH (ref 0.8–1.2)
Prothrombin Time: 16.9 seconds — ABNORMAL HIGH (ref 11.4–15.2)

## 2020-11-25 MED ORDER — ACETAMINOPHEN 325 MG PO TABS
650.0000 mg | ORAL_TABLET | Freq: Four times a day (QID) | ORAL | Status: DC | PRN
  Administered 2020-11-25: 650 mg via ORAL
  Filled 2020-11-25: qty 2

## 2020-11-25 MED ORDER — ASCORBIC ACID 500 MG PO TABS
1000.0000 mg | ORAL_TABLET | Freq: Two times a day (BID) | ORAL | Status: DC
  Administered 2020-11-25 – 2020-11-27 (×5): 1000 mg via ORAL
  Filled 2020-11-25 (×5): qty 2

## 2020-11-25 MED ORDER — ADULT MULTIVITAMIN W/MINERALS CH
1.0000 | ORAL_TABLET | Freq: Every day | ORAL | Status: DC
  Administered 2020-11-25 – 2020-11-27 (×3): 1 via ORAL
  Filled 2020-11-25 (×3): qty 1

## 2020-11-25 MED ORDER — APIXABAN 5 MG PO TABS
5.0000 mg | ORAL_TABLET | Freq: Two times a day (BID) | ORAL | Status: DC
  Administered 2020-11-25 – 2020-11-27 (×5): 5 mg via ORAL
  Filled 2020-11-25 (×5): qty 1

## 2020-11-25 MED ORDER — ACETAMINOPHEN 325 MG PO TABS
ORAL_TABLET | ORAL | Status: AC
  Administered 2020-11-25: 650 mg via ORAL
  Filled 2020-11-25: qty 2

## 2020-11-25 MED ORDER — IPRATROPIUM-ALBUTEROL 0.5-2.5 (3) MG/3ML IN SOLN
3.0000 mL | RESPIRATORY_TRACT | Status: DC
  Administered 2020-11-25 – 2020-11-26 (×3): 3 mL via RESPIRATORY_TRACT
  Filled 2020-11-25 (×4): qty 3

## 2020-11-25 MED ORDER — HYDRALAZINE HCL 20 MG/ML IJ SOLN: 5.0000 mg | INTRAMUSCULAR | Status: DC | PRN

## 2020-11-25 MED ORDER — DM-GUAIFENESIN ER 30-600 MG PO TB12: 1.0000 | ORAL_TABLET | Freq: Two times a day (BID) | ORAL | Status: DC | PRN

## 2020-11-25 MED ORDER — FUROSEMIDE 10 MG/ML IJ SOLN
80.0000 mg | Freq: Once | INTRAMUSCULAR | Status: AC
  Administered 2020-11-25: 80 mg via INTRAVENOUS
  Filled 2020-11-25: qty 8

## 2020-11-25 MED ORDER — AMIODARONE HCL 200 MG PO TABS
200.0000 mg | ORAL_TABLET | Freq: Two times a day (BID) | ORAL | Status: DC
  Administered 2020-11-25 – 2020-11-27 (×5): 200 mg via ORAL
  Filled 2020-11-25 (×6): qty 1

## 2020-11-25 MED ORDER — PANTOPRAZOLE SODIUM 40 MG PO TBEC
40.0000 mg | DELAYED_RELEASE_TABLET | Freq: Every day | ORAL | Status: DC
  Administered 2020-11-25: 40 mg via ORAL
  Filled 2020-11-25: qty 1

## 2020-11-25 MED ORDER — NICOTINE 21 MG/24HR TD PT24
21.0000 mg | MEDICATED_PATCH | Freq: Every day | TRANSDERMAL | Status: DC
  Administered 2020-11-27: 21 mg via TRANSDERMAL
  Filled 2020-11-25 (×3): qty 1

## 2020-11-25 MED ORDER — PANTOPRAZOLE SODIUM 40 MG PO TBEC
40.0000 mg | DELAYED_RELEASE_TABLET | Freq: Every day | ORAL | Status: DC
  Administered 2020-11-25 – 2020-11-27 (×3): 40 mg via ORAL
  Filled 2020-11-25 (×3): qty 1

## 2020-11-25 MED ORDER — ALBUTEROL SULFATE (2.5 MG/3ML) 0.083% IN NEBU: 2.5000 mg | INHALATION_SOLUTION | RESPIRATORY_TRACT | Status: DC | PRN

## 2020-11-25 MED ORDER — POTASSIUM CHLORIDE CRYS ER 20 MEQ PO TBCR
30.0000 meq | EXTENDED_RELEASE_TABLET | Freq: Every day | ORAL | Status: DC
  Administered 2020-11-25: 30 meq via ORAL
  Filled 2020-11-25: qty 2

## 2020-11-25 MED ORDER — DILTIAZEM HCL ER COATED BEADS 180 MG PO CP24
180.0000 mg | ORAL_CAPSULE | Freq: Every day | ORAL | Status: DC
  Administered 2020-11-25 – 2020-11-27 (×3): 180 mg via ORAL
  Filled 2020-11-25 (×3): qty 1

## 2020-11-25 MED ORDER — MOMETASONE FURO-FORMOTEROL FUM 200-5 MCG/ACT IN AERO
2.0000 | INHALATION_SPRAY | Freq: Two times a day (BID) | RESPIRATORY_TRACT | Status: DC
  Administered 2020-11-25 – 2020-11-27 (×4): 2 via RESPIRATORY_TRACT
  Filled 2020-11-25: qty 8.8

## 2020-11-25 MED ORDER — FLUOXETINE HCL 20 MG PO CAPS
40.0000 mg | ORAL_CAPSULE | Freq: Every day | ORAL | Status: DC
  Administered 2020-11-25 – 2020-11-27 (×3): 40 mg via ORAL
  Filled 2020-11-25 (×3): qty 2

## 2020-11-25 MED ORDER — METOPROLOL TARTRATE 50 MG PO TABS
50.0000 mg | ORAL_TABLET | Freq: Two times a day (BID) | ORAL | Status: DC
  Administered 2020-11-25 – 2020-11-27 (×4): 50 mg via ORAL
  Filled 2020-11-25 (×5): qty 1

## 2020-11-25 MED ORDER — FUROSEMIDE 40 MG PO TABS: 60.0000 mg | ORAL_TABLET | Freq: Every day | ORAL | Status: DC

## 2020-11-25 MED ORDER — ONDANSETRON HCL 4 MG/2ML IJ SOLN: 4.0000 mg | Freq: Three times a day (TID) | INTRAMUSCULAR | Status: DC | PRN

## 2020-11-25 MED ORDER — ACETAMINOPHEN 325 MG PO TABS: 650.0000 mg | ORAL_TABLET | Freq: Once | ORAL | Status: AC

## 2020-11-25 MED ORDER — IPRATROPIUM-ALBUTEROL 0.5-2.5 (3) MG/3ML IN SOLN
3.0000 mL | Freq: Once | RESPIRATORY_TRACT | Status: AC
  Administered 2020-11-25: 3 mL via RESPIRATORY_TRACT
  Filled 2020-11-25: qty 3

## 2020-11-25 MED ORDER — VITAMIN E 45 MG (100 UNIT) PO CAPS
400.0000 [IU] | ORAL_CAPSULE | Freq: Two times a day (BID) | ORAL | Status: DC
  Administered 2020-11-25 – 2020-11-27 (×5): 400 [IU] via ORAL
  Filled 2020-11-25 (×6): qty 4

## 2020-11-25 MED ORDER — FUROSEMIDE 10 MG/ML IJ SOLN
60.0000 mg | Freq: Two times a day (BID) | INTRAMUSCULAR | Status: DC
  Administered 2020-11-25: 60 mg via INTRAVENOUS
  Filled 2020-11-25: qty 8

## 2020-11-25 NOTE — ED Triage Notes (Signed)
Pt to ED via EMS from home c/o SOB tonight, used inhaler at home once with relief and then again later on in the night without relief.  Pt denies pain, productive white cough, denies fever, denies n/v/d.  Pt had new diagnosis afib 2 weeks and was cardioverted at that time.  EMS vitals: SR 56, 92% RA and placed on 2L [place] up to 99%, wheezing upper lobe, 32 CO2, 121/61 BP.  Presents A&Ox4, chest rise even, breathing labored.

## 2020-11-25 NOTE — H&P (Signed)
History and Physical    Angela Singh VVO:[id number] DOB: 1950/10/16 DOA: 11/25/2020  Referring MD/NP/PA:   PCP: Oswaldo Conroy, MD   Patient coming from:  The patient is coming from home.  At baseline, pt is independent for most of ADL.        Chief Complaint: SOB  HPI: Angela Singh is a 70 y.o. 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  Patient states that her shortness started last night, which has been progressively worsening.  She has cough with clear mucus production.  Denies chest pain, fever or chills.  Patient does not have nausea, vomiting, diarrhea or abdominal pain.  No symptoms of UTI.  Patient is not using oxygen at home, but was found to have oxygen desaturating to 85% on room air, which improved to 97% on 2 L oxygen in the ED.  ED Course: pt was found to have BNP 973, INR 1.4, troponin level 13, 12, negative COVID PCR, WBC 9.2, creatinine 1.03, BUN 15, temperature normal, blood pressure 104/87, heart rate 56-1 13, RR 27.  Chest x-ray showed pulmonary edema.  Patient is admitted to progressive bed as inpatient  Review of Systems:   General: no fevers, chills, no body weight gain, has fatigue HEENT: no blurry vision, hearing changes or sore throat Respiratory: has dyspnea, coughing, no wheezing CV: no chest pain, no palpitations GI: no nausea, vomiting, abdominal pain, diarrhea, constipation GU: no dysuria, burning on urination, increased urinary frequency, hematuria  Ext: has trace leg edema Neuro: no unilateral weakness, numbness, or tingling, no vision change or hearing loss Skin: no rash, no skin tear. MSK: No muscle spasm, no deformity, no limitation of range of movement in spin Heme: No easy bruising.  Travel history: No recent  long distant travel.  Allergy:  Allergies  Allergen Reactions   Codeine Other (See Comments)    weakness    Past Medical History:  Diagnosis Date   Chronic combined systolic (congestive) and diastolic (congestive) heart failure (HCC)    a. 09/2020 Echo: EF 60-65%. No rwma. Nl RV fxn. RVSP 38.30mmHg. Mild to mod MR. Mod TR; b. 10/2020 TEE: EF 45-50%, no rwma, nl RV fxn, RVSP 29.56mmHg, mild to mod MR/TR, Ao sclerosis, small PFO w/ R->L shunting. Mod dil LA.   COPD (chronic obstructive pulmonary disease) (HCC)    Essential hypertension    GERD (gastroesophageal reflux disease)    Mitral regurgitation    Mixed hyperlipidemia    Persistent atrial fibrillation (HCC)    a. Dx 09/2020. CHA2DS2VASc = 3-->Eliquis; b. 10/26/2020 TEE/DCCV: No LA thrombus-->successful DCCV; c. 10/2020 Recurrent Afib-->amio load-->DCCV-->recurrent afib.   Thrombus of left atrial appendage    a. 09/2020 noted on TEE in setting of Afib-->eliquis; b. 10/2020 TEE: no LA thrombus.   Tobacco abuse    Tricuspid regurgitation     Past Surgical History:  Procedure Laterality Date   CARDIOVERSION N/A 10/26/2020   Procedure: CARDIOVERSION;  Surgeon: Antonieta Iba, MD;  Location: [facility];  Service: Cardiovascular;  Laterality: N/A;   CARDIOVERSION N/A 11/01/2020   Procedure: CARDIOVERSION;  Surgeon: Debbe Odea, MD;  Location: [facility];  Service: Cardiovascular;  Laterality: N/A;   CARDIOVERSION N/A 11/23/2020   Procedure: CARDIOVERSION;  Surgeon: Antonieta Iba, MD;  Location: [facility];  Service: Cardiovascular;  Laterality: N/A;  TEE WITHOUT CARDIOVERSION N/A 09/25/2020   Procedure: TRANSESOPHAGEAL ECHOCARDIOGRAM (TEE);  Surgeon: Iran Ouch, MD;  Location: [facility];  Service: Cardiovascular;  Laterality: N/A;   TEE WITHOUT CARDIOVERSION N/A 10/26/2020   Procedure: TRANSESOPHAGEAL ECHOCARDIOGRAM (TEE);  Surgeon: Antonieta Iba, MD;  Location: [facility];  Service: Cardiovascular;  Laterality: N/A;     Social History:  reports that she has been smoking cigarettes. She has a 24.00 pack-year smoking history. She has never used smokeless tobacco. She reports that she does not currently use alcohol. She reports that she does not use drugs.  Family History:  Family History  Problem Relation Age of Onset   COPD Mother    Heart failure Father    Heart disease Sister      Prior to Admission medications   Medication Sig Start Date End Date Taking? Authorizing Provider  albuterol (VENTOLIN HFA) 108 (90 Base) MCG/ACT inhaler Inhale 2 puffs into the lungs every 4 (four) hours as needed.    [provider]  amiodarone (PACERONE) 200 MG tablet Take 1 tablet (200 mg total) by mouth 2 (two) times daily. 11/16/20   Creig Hines, NP  apixaban (ELIQUIS) 5 MG TABS tablet Take 1 tablet (5 mg total) by mouth 2 (two) times daily. 11/03/20 02/01/21  Darlin Priestly, MD  ascorbic acid (VITAMIN C) 500 MG tablet Take 1,000 mg by mouth 2 (two) times daily.    [provider]  budesonide-formoterol (SYMBICORT) 160-4.5 MCG/ACT inhaler Inhale 1 puff into the lungs 2 (two) times daily in the am and at bedtime.. 08/08/20   [provider]  diltiazem (CARDIZEM CD) 180 MG 24 hr capsule Take 1 capsule (180 mg total) by mouth daily. 11/07/20 02/05/21  End, Cristal Deer, MD  FLUoxetine (PROZAC) 40 MG capsule Take 40 mg by mouth daily.    [provider]  furosemide (LASIX) 40 MG tablet Take 1.5 tablets (60 mg total) by mouth daily. 11/21/20 12/21/20  Creig Hines, NP  metoprolol tartrate (LOPRESSOR) 50 MG tablet Take 1 tablet (50 mg total) by mouth 2 (two) times daily. Increased from 25 mg 2 times daily. 11/03/20 12/03/20  Darlin Priestly, MD  Multiple Vitamins-Minerals (ONE-A-DAY WOMENS 50+ PO) Take 1 tablet by mouth daily.    [provider]  nicotine (NICODERM CQ - DOSED IN MG/24 HOURS) 21 mg/24hr patch Place 1 patch (21 mg total) onto the skin daily. Patient not  taking: Reported on 11/16/2020 11/03/20   Darlin Priestly, MD  pantoprazole (PROTONIX) 40 MG tablet Take 40 mg by mouth daily. 09/11/20   [provider]  potassium chloride SA (KLOR-CON) 20 MEQ tablet Take 1.5 tablets (30 mEq total) by mouth daily. 11/21/20   Creig Hines, NP  vitamin E 180 MG (400 UNITS) capsule Take 400 Units by mouth 2 (two) times daily.    [provider]    Physical Exam: Vitals:   11/25/20 0959 11/25/20 1029 11/25/20 1030 11/25/20 1100  BP:  104/87 104/87 105/62  Pulse: 100 100 90 (!) 109  Resp: (!) 27  (!) 23 20  Temp:      TempSrc:      SpO2: (!) 87%  99% 97%  Weight:      Height:       General: Not in acute distress HEENT:       Eyes: PERRL, EOMI, no scleral icterus.       ENT: No discharge from the ears and nose, no pharynx injection, no tonsillar enlargement.  Neck: positive JVD, no bruit, no mass felt. Heme: No neck lymph node enlargement. Cardiac: S1/S2, RRR, No murmurs, No gallops or rubs. Respiratory:  GI: Soft, nondistended, nontender, no rebound pain, no organomegaly, BS present. GU: No hematuria Ext: trace pitting leg edema bilaterally. 1+DP/PT pulse bilaterally. Musculoskeletal: No joint deformities, No joint redness or warmth, no limitation of ROM in spin. Skin: No rashes.  Neuro: Alert, oriented X3, cranial nerves II-XII grossly intact, moves all extremities normally.  Psych: Patient is not psychotic, no suicidal or hemocidal ideation.  Labs on Admission: I have personally reviewed following labs and imaging studies  CBC: Recent Labs  Lab 11/25/20 0340  WBC 9.2  HGB 14.1  HCT 40.9  MCV 99.3  PLT 186   Basic Metabolic Panel: Recent Labs  Lab 11/25/20 0340  NA 134*  K 4.1  CL 95*  CO2 29  GLUCOSE 160*  BUN 15  CREATININE 1.03*  CALCIUM 8.9   GFR: Estimated Creatinine Clearance: 40.2 mL/min (A) (by C-G formula based on SCr of 1.03 mg/dL (H)). Liver Function Tests: No results for input(s):  AST, ALT, ALKPHOS, BILITOT, PROT, ALBUMIN in the last 168 hours. No results for input(s): LIPASE, AMYLASE in the last 168 hours. No results for input(s): AMMONIA in the last 168 hours. Coagulation Profile: Recent Labs  Lab 11/25/20 0340  INR 1.4*   Cardiac Enzymes: No results for input(s): CKTOTAL, CKMB, CKMBINDEX, TROPONINI in the last 168 hours. BNP (last 3 results) No results for input(s): PROBNP in the last 8760 hours. HbA1C: No results for input(s): HGBA1C in the last 72 hours. CBG: No results for input(s): GLUCAP in the last 168 hours. Lipid Profile: No results for input(s): CHOL, HDL, LDLCALC, TRIG, CHOLHDL, LDLDIRECT in the last 72 hours. Thyroid Function Tests: No results for input(s): TSH, T4TOTAL, FREET4, T3FREE, THYROIDAB in the last 72 hours. Anemia Panel: No results for input(s): VITAMINB12, FOLATE, FERRITIN, TIBC, IRON, RETICCTPCT in the last 72 hours. Urine analysis: No results found for: COLORURINE, APPEARANCEUR, LABSPEC, PHURINE, GLUCOSEU, HGBUR, BILIRUBINUR, KETONESUR, PROTEINUR, UROBILINOGEN, NITRITE, LEUKOCYTESUR Sepsis Labs: @LABRCNTIP (procalcitonin:4,lacticidven:4) ) Recent Results (from the past 240 hour(s))  Resp Panel by RT-PCR (Flu A&B, Covid) Nasopharyngeal Swab     Status: None   Collection Time: 11/25/20  8:37 AM   Specimen: Nasopharyngeal Swab; Nasopharyngeal(NP) swabs in vial transport medium  Result Value Ref Range Status   SARS Coronavirus 2 by RT PCR NEGATIVE NEGATIVE Final    Comment: (NOTE) SARS-CoV-2 target nucleic acids are NOT DETECTED.  The SARS-CoV-2 RNA is generally detectable in upper respiratory specimens during the acute phase of infection. The lowest concentration of SARS-CoV-2 viral copies this assay can detect is 138 copies/mL. A negative result does not preclude SARS-Cov-2 infection and should not be used as the sole basis for treatment or other patient management decisions. A negative result may occur with  improper  specimen collection/handling, submission of specimen other than nasopharyngeal swab, presence of viral mutation(s) within the areas targeted by this assay, and inadequate number of viral copies(<138 copies/mL). A negative result must be combined with clinical observations, patient history, and epidemiological information. The expected result is Negative.  Fact Sheet for Patients:  BloggerCourse.com  Fact Sheet for Healthcare Providers:  SeriousBroker.it  This test is no t yet approved or cleared by the [place] FDA and  has been authorized for detection and/or diagnosis of SARS-CoV-2 by FDA under an Emergency Use Authorization (EUA). This EUA will remain  in effect (meaning this test can be  used) for the duration of the COVID-19 declaration under Section 564(b)(1) of the Act, 21 U.S.C.section 360bbb-3(b)(1), unless the authorization is terminated  or revoked sooner.       Influenza A by PCR NEGATIVE NEGATIVE Final   Influenza B by PCR NEGATIVE NEGATIVE Final    Comment: (NOTE) The Xpert Xpress SARS-CoV-2/FLU/RSV plus assay is intended as an aid in the diagnosis of influenza from Nasopharyngeal swab specimens and should not be used as a sole basis for treatment. Nasal washings and aspirates are unacceptable for Xpert Xpress SARS-CoV-2/FLU/RSV testing.  Fact Sheet for Patients: BloggerCourse.com  Fact Sheet for Healthcare Providers: SeriousBroker.it  This test is not yet approved or cleared by the [place] FDA and has been authorized for detection and/or diagnosis of SARS-CoV-2 by FDA under an Emergency Use Authorization (EUA). This EUA will remain in effect (meaning this test can be used) for the duration of the COVID-19 declaration under Section 564(b)(1) of the Act, 21 U.S.C. section 360bbb-3(b)(1), unless the authorization is terminated or revoked.  Performed at  [facility], [address] [place], [place] [address]      Radiological Exams on Admission: DG Chest 2 View  Result Date: 11/25/2020 CLINICAL DATA:  Shortness of breath EXAM: CHEST - 2 VIEW COMPARISON:  11/02/2020 FINDINGS: The heart remains at the upper limit of normal in size. Redemonstrated diffuse interstitial prominence, with somewhat decreased vascular congestion. Possible trace right pleural effusion. No acute osseous abnormality. IMPRESSION: Diffuse interstitial prominence, which could reflect edema or chronic lung disease, with likely trace right pleural effusion versus pleural thickening. Electronically Signed   By: Wiliam Ke M.D.   On: 11/25/2020 03:56     EKG: I have personally reviewed.  Sinus rhythm, QTC 438, low voltage, poor R wave progression   Assessment/Plan Principal Problem:   Acute on chronic systolic CHF (congestive heart failure) (HCC) Active Problems:   Essential hypertension   Hyperlipidemia   Tobacco abuse   Depression   COPD (chronic obstructive pulmonary disease) (HCC)   Thrombus of left atrial appendage   Atrial fibrillation, chronic (HCC)   Acute respiratory failure with hypoxia (HCC)   Acute respiratory failure with hypoxia due to acute on chronic systolic CHF (congestive heart failure) (HCC): Patient has only has trace leg edema, but has positive JVD, elevated BNP 973, pulmonary edema chest x-ray, clinically consistent with CHF exacerbation.  2D echo 10/26/2020 showed EF of 45-50%.  -Will admit to progressive unit as inpatient -Lasix 60 mg bid by IV -2d echo -Daily weights -strict I/O's -Low salt diet -Fluid restriction -Obtain REDs Vest reading  Essential hypertension: -IV hydralazine as needed -Cardizem, metoprolol,  Hyperlipidemia: Patient not taking medications currently -Follow-up with PCP  Tobacco abuse -Nicotine patch  Depression -Continue home medications  COPD (chronic obstructive pulmonary disease)  (HCC) -Bronchodilators  Thrombus of left atrial appendage -Continue Eliquis  Atrial fibrillation, chronic (HCC) -Continue Eliquis and metoprolol, Cardizem     DVT ppx: on Eliquis Code Status: Full code Family Communication: I called her son by phone Disposition Plan:  Anticipate discharge back to previous environment Consults called:  none Admission status and Level of care: Progressive Cardiac:   as inpt      Status is: Inpatient  Remains inpatient appropriate because: Patient has multiple comorbidities, now presents with acute respiratory failure with hypoxia due to CHF exacerbation.  Patient has new oxygen requirement.  Her presentation is highly complicated.  Patient is at high risk of deteriorating.  Will need to be  treated in the hospital for at least 2 days           Date of Service 11/25/2020    Lorretta Harp [facility]   If 7PM-7AM, please contact night-coverage [email or web address] 11/25/2020, 11:51 AM

## 2020-11-25 NOTE — ED Notes (Signed)
Patient's oxygen saturation dropped while ambulating in room to toilet

## 2020-11-25 NOTE — ED Notes (Signed)
Patient ambulated with pulse oximetry monitor per [facility] request. Patient's oxygen saturation decreased to 87% on RA, and respiratory rate increased to 27 bpm (from 17). MD informed.

## 2020-11-25 NOTE — ED Notes (Signed)
Dr Niu at bedside 

## 2020-11-25 NOTE — ED Provider Notes (Signed)
[facility] Emergency Department Provider Note   ____________________________________________   Event Date/Time   First MD Initiated Contact with Patient 11/25/20 [phone]     (approximate)  I have reviewed the triage vital signs and the nursing notes.   HISTORY  Chief Complaint Shortness of Breath   HPI RAPHAEL ESPE is a 70 y.o. female with a history of CHF and COPD.  She has recently been cardioverted for A. fib.  Patient comes in complaining of cough productive of thick white phlegm and some shortness of breath.  She says she felt somewhat short of breath prior to being cardioverted.  She is not having any chest pain or tightness.        Past Medical History:  Diagnosis Date   Chronic combined systolic (congestive) and diastolic (congestive) heart failure (HCC)    a. 09/2020 Echo: EF 60-65%. No rwma. Nl RV fxn. RVSP 38.71mmHg. Mild to mod MR. Mod TR; b. 10/2020 TEE: EF 45-50%, no rwma, nl RV fxn, RVSP 29.97mmHg, mild to mod MR/TR, Ao sclerosis, small PFO w/ R->L shunting. Mod dil LA.   COPD (chronic obstructive pulmonary disease) (HCC)    Essential hypertension    GERD (gastroesophageal reflux disease)    Mitral regurgitation    Mixed hyperlipidemia    Persistent atrial fibrillation (HCC)    a. Dx 09/2020. CHA2DS2VASc = 3-->Eliquis; b. 10/26/2020 TEE/DCCV: No LA thrombus-->successful DCCV; c. 10/2020 Recurrent Afib-->amio load-->DCCV-->recurrent afib.   Thrombus of left atrial appendage    a. 09/2020 noted on TEE in setting of Afib-->eliquis; b. 10/2020 TEE: no LA thrombus.   Tobacco abuse    Tricuspid regurgitation     Patient Active Problem List   Diagnosis Date Noted   Acute on chronic congestive heart failure (HCC)    Acute on chronic systolic CHF (congestive heart failure) (HCC) 10/29/2020   Sepsis (HCC) 10/29/2020   Abnormal LFTs 10/29/2020   Digoxin toxicity 10/12/2020   Elevated troponin 10/12/2020   COPD (chronic obstructive pulmonary  disease) (HCC)    Chronic diastolic CHF (congestive heart failure) (HCC)    Hypokalemia    Persistent atrial fibrillation (HCC)    Weakness    Depression    Essential hypertension 09/22/2020   Hyperlipidemia 09/22/2020   GERD (gastroesophageal reflux disease) 09/22/2020   COPD exacerbation (HCC) 09/22/2020   Tobacco abuse 09/22/2020   Atrial fibrillation with RVR (HCC) 09/21/2020    Past Surgical History:  Procedure Laterality Date   CARDIOVERSION N/A 10/26/2020   Procedure: CARDIOVERSION;  Surgeon: Antonieta Iba, MD;  Location: [facility] ORS;  Service: Cardiovascular;  Laterality: N/A;   CARDIOVERSION N/A 11/01/2020   Procedure: CARDIOVERSION;  Surgeon: Debbe Odea, MD;  Location: [facility] ORS;  Service: Cardiovascular;  Laterality: N/A;   CARDIOVERSION N/A 11/23/2020   Procedure: CARDIOVERSION;  Surgeon: Antonieta Iba, MD;  Location: [facility] ORS;  Service: Cardiovascular;  Laterality: N/A;   TEE WITHOUT CARDIOVERSION N/A 09/25/2020   Procedure: TRANSESOPHAGEAL ECHOCARDIOGRAM (TEE);  Surgeon: Iran Ouch, MD;  Location: [facility] ORS;  Service: Cardiovascular;  Laterality: N/A;   TEE WITHOUT CARDIOVERSION N/A 10/26/2020   Procedure: TRANSESOPHAGEAL ECHOCARDIOGRAM (TEE);  Surgeon: Antonieta Iba, MD;  Location: [facility] ORS;  Service: Cardiovascular;  Laterality: N/A;    Prior to Admission medications   Medication Sig Start Date End Date Taking? Authorizing Provider  albuterol (VENTOLIN HFA) 108 (90 Base) MCG/ACT inhaler Inhale 2 puffs into the lungs every 4 (four) hours as needed.    [provider]  amiodarone (PACERONE) 200 MG tablet Take 1 tablet (200 mg total) by mouth 2 (two) times daily. 11/16/20   Creig Hines, NP  apixaban (ELIQUIS) 5 MG TABS tablet Take 1 tablet (5 mg total) by mouth 2 (two) times daily. 11/03/20 02/01/21  Darlin Priestly, MD  ascorbic acid (VITAMIN C) 500 MG tablet Take 1,000 mg by mouth 2 (two) times daily.    [provider]   budesonide-formoterol (SYMBICORT) 160-4.5 MCG/ACT inhaler Inhale 1 puff into the lungs 2 (two) times daily in the am and at bedtime.. 08/08/20   [provider]  diltiazem (CARDIZEM CD) 180 MG 24 hr capsule Take 1 capsule (180 mg total) by mouth daily. 11/07/20 02/05/21  End, Cristal Deer, MD  FLUoxetine (PROZAC) 40 MG capsule Take 40 mg by mouth daily.    [provider]  furosemide (LASIX) 40 MG tablet Take 1.5 tablets (60 mg total) by mouth daily. 11/21/20 12/21/20  Creig Hines, NP  metoprolol tartrate (LOPRESSOR) 50 MG tablet Take 1 tablet (50 mg total) by mouth 2 (two) times daily. Increased from 25 mg 2 times daily. 11/03/20 12/03/20  Darlin Priestly, MD  Multiple Vitamins-Minerals (ONE-A-DAY WOMENS 50+ PO) Take 1 tablet by mouth daily.    [provider]  nicotine (NICODERM CQ - DOSED IN MG/24 HOURS) 21 mg/24hr patch Place 1 patch (21 mg total) onto the skin daily. Patient not taking: Reported on 11/16/2020 11/03/20   Darlin Priestly, MD  pantoprazole (PROTONIX) 40 MG tablet Take 40 mg by mouth daily. 09/11/20   [provider]  potassium chloride SA (KLOR-CON) 20 MEQ tablet Take 1.5 tablets (30 mEq total) by mouth daily. 11/21/20   Creig Hines, NP  vitamin E 180 MG (400 UNITS) capsule Take 400 Units by mouth 2 (two) times daily.    [provider]    Allergies Codeine  Family History  Problem Relation Age of Onset   COPD Mother    Heart failure Father    Heart disease Sister     Social History Social History   Tobacco Use   Smoking status: Every Day    Packs/day: 0.50    Years: 48.00    Pack years: 24.00    Types: Cigarettes   Smokeless tobacco: Never  Vaping Use   Vaping Use: Never used  Substance Use Topics   Alcohol use: Not Currently   Drug use: Never    Review of Systems  Constitutional: No fever/chills Eyes: No visual changes. ENT: No sore throat. Cardiovascular: Denies chest pain. Respiratory: shortness  of breath. Gastrointestinal: No abdominal pain.  No nausea, no vomiting.  No diarrhea.  No constipation. Genitourinary: Negative for dysuria. Musculoskeletal: Negative for back pain. Skin: Negative for rash. Neurological: Negative for headaches, focal weakness   ____________________________________________   PHYSICAL EXAM:  VITAL SIGNS: ED Triage Vitals  Enc Vitals Group     BP 11/25/20 0333 (!) 121/59     Pulse Rate 11/25/20 0333 (!) 56     Resp 11/25/20 0333 16     Temp 11/25/20 0333 98.4 F (36.9 C)     Temp Source 11/25/20 0333 Oral     SpO2 11/25/20 0333 94 %     Weight 11/25/20 0336 109 lb (49.4 kg)     Height 11/25/20 0336 5\' 4"  (1.626 m)     Head Circumference --      Peak Flow --      Pain Score 11/25/20 0336 0     Pain Loc --  Pain Edu? --      Excl. in GC? --     Constitutional: Alert and oriented. Well appearing and in no acute distress. Eyes: Conjunctivae are normal.  Head: Atraumatic. Nose: No congestion/rhinnorhea. Mouth/Throat: Mucous membranes are moist.  Oropharynx non-erythematous. Neck: No stridor.  Cardiovascular: Normal rate, regular rhythm. Grossly normal heart sounds.  Good peripheral circulation. Respiratory: Normal respiratory effort.  No retractions. Lungs wheezes scattered throughout her lung fields Gastrointestinal: Soft and nontender. No distention. No abdominal bruits.  Musculoskeletal: No lower extremity tenderness nor edema.   Neurologic:  Normal speech and language. No gross focal neurologic deficits are appreciated.  Skin:  Skin is warm, dry and intact. No rash noted.   ____________________________________________   LABS (all labs ordered are listed, but only abnormal results are displayed)  Labs Reviewed  BASIC METABOLIC PANEL - Abnormal; Notable for the following components:      Result Value   Sodium 134 (*)    Chloride 95 (*)    Glucose, Bld 160 (*)    Creatinine, Ser 1.03 (*)    GFR, Estimated 59 (*)    All other  components within normal limits  CBC - Abnormal; Notable for the following components:   MCH 34.2 (*)    All other components within normal limits  PROTIME-INR - Abnormal; Notable for the following components:   Prothrombin Time 16.9 (*)    INR 1.4 (*)    All other components within normal limits  BRAIN NATRIURETIC PEPTIDE - Abnormal; Notable for the following components:   B Natriuretic Peptide 973.1 (*)    All other components within normal limits  RESP PANEL BY RT-PCR (FLU A&B, COVID) ARPGX2  D-DIMER, QUANTITATIVE  TROPONIN I (HIGH SENSITIVITY)  TROPONIN I (HIGH SENSITIVITY)   ____________________________________________  EKG  EKG read interpreted by me shows sinus bradycardia rate of 54 normal axis no acute ST-T wave changes ____________________________________________  RADIOLOGY Jill Poling, personally viewed and evaluated these images (plain radiographs) as part of my medical decision making, as well as reviewing the written report by the radiologist.  ED MD interpretation: Chest x-ray read by radiology reviewed by me shows diffuse interstitial prominence.  In view of the patient's past history and the fact that she has a BNP of 973 this is likely congestive failure.  Official radiology report(s): DG Chest 2 View  Result Date: 11/25/2020 CLINICAL DATA:  Shortness of breath EXAM: CHEST - 2 VIEW COMPARISON:  11/02/2020 FINDINGS: The heart remains at the upper limit of normal in size. Redemonstrated diffuse interstitial prominence, with somewhat decreased vascular congestion. Possible trace right pleural effusion. No acute osseous abnormality. IMPRESSION: Diffuse interstitial prominence, which could reflect edema or chronic lung disease, with likely trace right pleural effusion versus pleural thickening. Electronically Signed   By: Wiliam Ke M.D.   On: 11/25/2020 03:56    ____________________________________________   PROCEDURES  Procedure(s) performed (including  Critical Care):  Procedures   ____________________________________________   INITIAL IMPRESSION / ASSESSMENT AND PLAN / ED COURSE  Patient's old records reviewed old medications reviewed old EKGs reviewed.  Patient takes Lasix 60 mg per dose.  She has not had at this morning.  I will replace that with a little bit larger dose of IV Lasix.  We will give her some albuterol for her wheezing and see how she does.    ----------------------------------------- 10:21 AM on 11/25/2020 ----------------------------------------- Patient's BNP is elevated.  She is gotten nebulizers.  She is put out quite a  bit of urine now the first time it was measured.  She still short of breath and hypoxic with ambulation we will have to get her in the hospital.  Additionally I will check a D-dimer just in case.  She did have a clot in her atrium at the time she was cardioverted but that had been after some time of anticoagulation.  She is not having any chest pain either.         ____________________________________________   FINAL CLINICAL IMPRESSION(S) / ED DIAGNOSES  Final diagnoses:  Hypoxia  SOB (shortness of breath)  Systolic congestive heart failure, unspecified HF chronicity [facility])     ED Discharge Orders     None        Note:  This document was prepared using Dragon voice recognition software and may include unintentional dictation errors.    Arnaldo Natal, MD 11/25/20 1022

## 2020-11-25 NOTE — ED Notes (Signed)
ECHO at bedside.

## 2020-11-26 ENCOUNTER — Encounter: Payer: Self-pay | Admitting: Student

## 2020-11-26 DIAGNOSIS — I5023 Acute on chronic systolic (congestive) heart failure: Secondary | ICD-10-CM | POA: Diagnosis not present

## 2020-11-26 DIAGNOSIS — I10 Essential (primary) hypertension: Secondary | ICD-10-CM | POA: Diagnosis not present

## 2020-11-26 DIAGNOSIS — E876 Hypokalemia: Secondary | ICD-10-CM

## 2020-11-26 DIAGNOSIS — J9601 Acute respiratory failure with hypoxia: Secondary | ICD-10-CM | POA: Diagnosis not present

## 2020-11-26 DIAGNOSIS — I4891 Unspecified atrial fibrillation: Secondary | ICD-10-CM

## 2020-11-26 LAB — MAGNESIUM: Magnesium: 1.8 mg/dL (ref 1.7–2.4)

## 2020-11-26 LAB — BASIC METABOLIC PANEL
Anion gap: 10 (ref 5–15)
BUN: 18 mg/dL (ref 8–23)
CO2: 32 mmol/L (ref 22–32)
Calcium: 8.3 mg/dL — ABNORMAL LOW (ref 8.9–10.3)
Chloride: 94 mmol/L — ABNORMAL LOW (ref 98–111)
Creatinine, Ser: 0.94 mg/dL (ref 0.44–1.00)
GFR, Estimated: >60 mL/min (ref >60)
Glucose, Bld: 119 mg/dL — ABNORMAL HIGH (ref 70–99)
Potassium: 3.3 mmol/L — ABNORMAL LOW (ref 3.5–5.1)
Sodium: 136 mmol/L (ref 135–145)

## 2020-11-26 MED ORDER — FUROSEMIDE 10 MG/ML IJ SOLN
40.0000 mg | Freq: Two times a day (BID) | INTRAMUSCULAR | Status: DC
  Administered 2020-11-26 – 2020-11-27 (×2): 40 mg via INTRAVENOUS
  Filled 2020-11-26 (×3): qty 4

## 2020-11-26 MED ORDER — LEVALBUTEROL HCL 0.63 MG/3ML IN NEBU
0.6300 mg | INHALATION_SOLUTION | Freq: Four times a day (QID) | RESPIRATORY_TRACT | Status: DC
  Administered 2020-11-26 – 2020-11-27 (×4): 0.63 mg via RESPIRATORY_TRACT
  Filled 2020-11-26 (×4): qty 3

## 2020-11-26 MED ORDER — POTASSIUM CHLORIDE CRYS ER 20 MEQ PO TBCR
40.0000 meq | EXTENDED_RELEASE_TABLET | ORAL | Status: AC
Start: 2020-11-26 — End: 2020-11-26
  Administered 2020-11-26 (×2): 40 meq via ORAL
  Filled 2020-11-26: qty 4
  Filled 2020-11-26: qty 2

## 2020-11-26 MED ORDER — MAGNESIUM SULFATE 2 GM/50ML IV SOLN
2.0000 g | Freq: Once | INTRAVENOUS | Status: AC
  Administered 2020-11-26: 2 g via INTRAVENOUS
  Filled 2020-11-26: qty 50

## 2020-11-26 MED ORDER — IPRATROPIUM BROMIDE 0.02 % IN SOLN
0.5000 mg | Freq: Four times a day (QID) | RESPIRATORY_TRACT | Status: DC
  Administered 2020-11-26 – 2020-11-27 (×4): 0.5 mg via RESPIRATORY_TRACT
  Filled 2020-11-26 (×4): qty 2.5

## 2020-11-26 NOTE — ED Notes (Signed)
Pt resting comfortably at this time. Normal rise and fall of chest. Call bell in reach.

## 2020-11-26 NOTE — Evaluation (Signed)
Occupational Therapy Evaluation Patient Details Name: Angela Singh MRN: [id number] DOB: April 26, 1950 Today's Date: 11/26/2020   History of Present Illness Pt is a 70 y/o F admitted on 11/25/20 with c/c of SOB. Pt is being treated for Acute respiratory failure with hypoxia due to acute on chronic systolic CHF (congestive heart failure). PMH: sCHF, HTN, HLD, COPD, GERD, depression, tobacco abuse, a-fib, thrombus of L atrial appendage on eliquis, tricuspid valve regurgitation   Clinical Impression   Pt seen for OT evaluation this date in setting of acute hospitalization d/t respiratory failure. Pt reports living alone and being INDEP at baseline, but endorses decreasing tolerance for activity. On ADL assessment, she is able to perform aDLs/ADL mobility with MOD I/INDEP ultimately appearing to be at her functional baseline. That said, she is noted to be slight with decreased musculature to extremities and decreased standing tolerance as well as shaking with more exertional tasks such as dynamic standing tasks to simulate IADLs. She reports this shaking has been happening for some time. OT engaes pt in ed re: importance of nutrition and exercise to improve musculature for balance and activity tolerance. In addition, pt endorses smoking and OT educates how this is impacting her ability to participate in ADLs. Pt with good reception and is trying to quit smoking. No further needs detected at this time. Will complete OT oder.      Recommendations for follow up therapy are one component of a multi-disciplinary discharge planning process, led by the attending physician.  Recommendations may be updated based on patient status, additional functional criteria and insurance authorization.   Follow Up Recommendations  No OT follow up    Equipment Recommendations  None recommended by OT    Recommendations for Other Services       Precautions / Restrictions Precautions Precautions:  None Restrictions Weight Bearing Restrictions: No      Mobility Bed Mobility Overal bed mobility: Independent                  Transfers Overall transfer level: Independent                    Balance Overall balance assessment: Modified Independent                                         ADL either performed or assessed with clinical judgement   ADL Overall ADL's : Modified independent;At baseline                                       General ADL Comments: requires hand hold to something sturdy for dynamic reaching to simulate IADLs, but otherwise compelte INDEP.     Vision         Perception     Praxis      Pertinent Vitals/Pain Pain Assessment: No/denies pain     Hand Dominance     Extremity/Trunk Assessment Upper Extremity Assessment Upper Extremity Assessment: Overall WFL for tasks assessed (noted some light tremors with activity that pt reports happens at baseline.)   Lower Extremity Assessment Lower Extremity Assessment: Overall WFL for tasks assessed       Communication Communication Communication: No difficulties   Cognition Arousal/Alertness: Awake/alert Behavior During Therapy: Flat affect Overall Cognitive Status: Within Functional Limits for tasks assessed  General Comments       Exercises Other Exercises Other Exercises: OT engaes pt in ed re: importance of nutrition and exercise to improve musculature for balance and activity tolerance. In addition, pt endorses smoking and OT educates how this is impacting her ability to participate in ADLs. Pt with good reception and is trying to quit smoking.   Shoulder Instructions      Home Living Family/patient expects to be discharged to:: Private residence Living Arrangements: Alone Available Help at Discharge: Family;Available PRN/intermittently (comments as if the family member that helps her is  not that reliable) Type of Home: Mobile home Home Access: Stairs to enter Entrance Stairs-Number of Steps: 5-6 Entrance Stairs-Rails: Right;Left;Can reach both Home Layout: One level               Home Equipment: None          Prior Functioning/Environment Level of Independence: Independent        Comments: Independent, driving, no falls in past 6 months        OT Problem List: Decreased strength;Decreased activity tolerance;Cardiopulmonary status limiting activity      OT Treatment/Interventions:      OT Goals(Current goals can be found in the care plan section) Acute Rehab OT Goals Patient Stated Goal: none stated OT Goal Formulation: All assessment and education complete, DC therapy  OT Frequency:     Barriers to D/C:            Co-evaluation              AM-PAC OT "6 Clicks" Daily Activity     Outcome Measure Help from another person eating meals?: None Help from another person taking care of personal grooming?: None Help from another person toileting, which includes using toliet, bedpan, or urinal?: None Help from another person bathing (including washing, rinsing, drying)?: None Help from another person to put on and taking off regular upper body clothing?: None Help from another person to put on and taking off regular lower body clothing?: None 6 Click Score: 24   End of Session Nurse Communication: Mobility status  Activity Tolerance: Patient tolerated treatment well Patient left: in bed  OT Visit Diagnosis: Muscle weakness (generalized) (M62.81)                Time: 6578-4696 OT Time Calculation (min): 17 min Charges:  OT General Charges $OT Visit: 1 Visit OT Evaluation $OT Eval Low Complexity: 1 Low  Rejeana Brock, MS, OTR/L ascom [phone] 11/26/20, 2:42 PM

## 2020-11-26 NOTE — Progress Notes (Signed)
Patient arrived to 257 in NAD, VS stable and patient free from pain. Patient oriented to room and call bell in reach.

## 2020-11-26 NOTE — Progress Notes (Signed)
   11/26/20 1304  Assess: MEWS Score  Temp 97.7 F (36.5 C)  BP 111/76  Pulse Rate (!) 117  Resp 18  SpO2 95 %  O2 Device Room Air  Assess: MEWS Score  MEWS Temp 0  MEWS Systolic 0  MEWS Pulse 2  MEWS RR 0  MEWS LOC 0  MEWS Score 2  MEWS Score Color Yellow  Assess: if the MEWS score is Yellow or Red  Were vital signs taken at a resting state? Yes  Focused Assessment No change from prior assessment  Does the patient meet 2 or more of the SIRS criteria? No  MEWS guidelines implemented *See Row Information* Yes  Treat  MEWS Interventions Escalated (See documentation below)  Pain Scale 0-10  Pain Score 0  Take Vital Signs  Increase Vital Sign Frequency  Yellow: Q 2hr X 2 then Q 4hr X 2, if remains yellow, continue Q 4hrs  Escalate  MEWS: Escalate Yellow: discuss with charge nurse/RN and consider discussing with provider and RRT  Notify: Charge Nurse/RN  Name of Charge Nurse/RN Notified Sheria Lang  Date Charge Nurse/RN Notified 11/26/20  Time Charge Nurse/RN Notified 1305  Notify: Provider  Provider Name/Title Gofa  Date Provider Notified 11/26/20  Time Provider Notified 1300  Notification Type Call  Notification Reason Other (Comment) (patient has been red and yellow in ED, no need to call MD)  Provider response No new orders  Date of Provider Response 11/26/20  Time of Provider Response 1300  Document  Patient Outcome Other (Comment) (no change)  Progress note created (see row info) Yes  Assess: SIRS CRITERIA  SIRS Temperature  0  SIRS Pulse 1  SIRS Respirations  0  SIRS WBC 0  SIRS Score Sum  1

## 2020-11-26 NOTE — Progress Notes (Signed)
PROGRESS NOTE  Angela Singh EYC:[id number] DOB: 06-24-1950   PCP: Oswaldo Conroy, MD  Patient is from: Home.  Independent with ADLs at baseline.  DOA: 11/25/2020 LOS: 1  Chief complaints:  Chief Complaint  Patient presents with   Shortness of Breath     Brief Narrative / Interim history: 70 year old F with PMH of systolic CHF, LAA thrombus, A. fib, COPD, tobacco abuse, HTN, HLD, GERD and depression presenting with shortness of breath, productive cough with clear phlegm and hypoxemia to 85% on room air, and admitted for acute on chronic systolic CHF.  BNP elevated to 1000.  CXR with pulmonary edema.  Started on IV Lasix  Subjective: Seen and examined earlier this morning.  No major events overnight of this morning.  Reports improvement in her breathing.  She denies chest pain, palpitation, dizziness, GI or UTI symptoms.  Objective: Vitals:   11/26/20 1252 11/26/20 1304 11/26/20 1306 11/26/20 1434  BP: 108/75 111/76    Pulse: (!) 102 (!) 117    Resp: 20 18    Temp:  97.7 F (36.5 C)    TempSrc:      SpO2: 95% 95%  93%  Weight:   49.9 kg   Height:   5\' 4"  (1.626 m)     Intake/Output Summary (Last 24 hours) at 11/26/2020 1439 Last data filed at 11/26/2020 1154 Gross per 24 hour  Intake --  Output 1200 ml  Net -1200 ml   Filed Weights   11/25/20 0336 11/26/20 1306  Weight: 49.4 kg 49.9 kg    Examination:  GENERAL: No apparent distress.  Nontoxic. HEENT: MMM.  Vision and hearing grossly intact.  NECK: Supple.  No apparent JVD.  RESP:  No IWOB.  Fair aeration bilaterally. CVS: RRR at 100-120. Heart sounds normal.  ABD/GI/GU: BS+. Abd soft, NTND.  MSK/EXT:  Moves extremities. No apparent deformity. No edema.  SKIN: no apparent skin lesion or wound NEURO: Awake, alert and oriented appropriately.  No apparent focal neuro deficit. PSYCH: Calm. Normal affect.   Procedures:  None  Microbiology summarized: COVID-19 and influenza PCR  nonreactive.  Assessment & Plan: Acute respiratory failure with hypoxia due to acute on chronic systolic CHF with recovered EF: TTE with LVEF of 60 to 65%, moderate MVR and moderate TVR.  Patient had JVD, elevated BNP to 973 and pulmonary edema on CXR on admission.  Unclear cause of the exacerbation.  Respiratory symptoms improved with IV Lasix.  However, soft blood pressure with mild RVR.  Renal function stable. -Reduce IV Lasix to 40 mg twice daily-on hold for hypotension -Monitor fluid status, renal functions and electrolytes  -Sodium and fluid restriction  -REDs Vest reading  Thrombus of left atrial appendage -Continue Eliquis  A. fib with RVR: HR in the range of 100-120s.  Does not seem to be symptomatic from this. -Continue home amiodarone, metoprolol and Cardizem CD -Continue Eliquis for anticoagulation -Change DuoNeb to Xopenex and Atrovent -Optimize electrolytes  Hypotension/history of essential hypertension: -Cardiac meds as above. -Discontinue as needed hydralazine  Chronic COPD -Continue bronchodilators.    Tobacco abuse: Admits to smoking about 6 cigarettes a day -Encouraged tobacco cessation -Nicotine patch   Depression: Stable -Continue home medications   Hypokalemia: K3.3.  Mg 1.8. -K-Dur 40x2 -IV magnesium sulfate 2 g x 1  Body mass index is 18.86 kg/m.         DVT prophylaxis:   apixaban (ELIQUIS) tablet 5 mg  Code Status: Full code Family Communication: Patient and/or RN.  Available if any question.  Level of care: Med-Surg with cardiac monitor Status is: Inpatient  Remains inpatient appropriate because: Hemodynamically unstable, electrolyte derangements       Consultants:  None   Sch Meds:  Scheduled Meds:  amiodarone  200 mg Oral BID   apixaban  5 mg Oral BID   ascorbic acid  1,000 mg Oral BID   diltiazem  180 mg Oral Daily   FLUoxetine  40 mg Oral Daily   furosemide  40 mg Intravenous Q12H   ipratropium  0.5 mg Nebulization Q6H    levalbuterol  0.63 mg Nebulization Q6H   metoprolol tartrate  50 mg Oral BID   mometasone-formoterol  2 puff Inhalation BID   multivitamin with minerals  1 tablet Oral Daily   nicotine  21 mg Transdermal Daily   pantoprazole  40 mg Oral Daily   potassium chloride  40 mEq Oral Q4H   vitamin E  400 Units Oral BID   Continuous Infusions: PRN Meds:.acetaminophen, albuterol, dextromethorphan-guaiFENesin, ondansetron (ZOFRAN) IV  Antimicrobials: Anti-infectives (From admission, onward)    None        I have personally reviewed the following labs and images: CBC: Recent Labs  Lab 11/25/20 0340  WBC 9.2  HGB 14.1  HCT 40.9  MCV 99.3  PLT 186   BMP &GFR Recent Labs  Lab 11/25/20 0340 11/26/20 0610  NA 134* 136  K 4.1 3.3*  CL 95* 94*  CO2 29 32  GLUCOSE 160* 119*  BUN 15 18  CREATININE 1.03* 0.94  CALCIUM 8.9 8.3*  MG  --  1.8   Estimated Creatinine Clearance: 44.5 mL/min (by C-G formula based on SCr of 0.94 mg/dL). Liver & Pancreas: No results for input(s): AST, ALT, ALKPHOS, BILITOT, PROT, ALBUMIN in the last 168 hours. No results for input(s): LIPASE, AMYLASE in the last 168 hours. No results for input(s): AMMONIA in the last 168 hours. Diabetic: No results for input(s): HGBA1C in the last 72 hours. No results for input(s): GLUCAP in the last 168 hours. Cardiac Enzymes: No results for input(s): CKTOTAL, CKMB, CKMBINDEX, TROPONINI in the last 168 hours. No results for input(s): PROBNP in the last 8760 hours. Coagulation Profile: Recent Labs  Lab 11/25/20 0340  INR 1.4*   Thyroid Function Tests: No results for input(s): TSH, T4TOTAL, FREET4, T3FREE, THYROIDAB in the last 72 hours. Lipid Profile: No results for input(s): CHOL, HDL, LDLCALC, TRIG, CHOLHDL, LDLDIRECT in the last 72 hours. Anemia Panel: No results for input(s): VITAMINB12, FOLATE, FERRITIN, TIBC, IRON, RETICCTPCT in the last 72 hours. Urine analysis: No results found for: COLORURINE,  APPEARANCEUR, LABSPEC, PHURINE, GLUCOSEU, HGBUR, BILIRUBINUR, KETONESUR, PROTEINUR, UROBILINOGEN, NITRITE, LEUKOCYTESUR Sepsis Labs: Invalid input(s): PROCALCITONIN, LACTICIDVEN  Microbiology: Recent Results (from the past 240 hour(s))  Resp Panel by RT-PCR (Flu A&B, Covid) Nasopharyngeal Swab     Status: None   Collection Time: 11/25/20  8:37 AM   Specimen: Nasopharyngeal Swab; Nasopharyngeal(NP) swabs in vial transport medium  Result Value Ref Range Status   SARS Coronavirus 2 by RT PCR NEGATIVE NEGATIVE Final    Comment: (NOTE) SARS-CoV-2 target nucleic acids are NOT DETECTED.  The SARS-CoV-2 RNA is generally detectable in upper respiratory specimens during the acute phase of infection. The lowest concentration of SARS-CoV-2 viral copies this assay can detect is 138 copies/mL. A negative result does not preclude SARS-Cov-2 infection and should not be used as the sole basis for treatment or other patient management decisions. A negative result may occur with  improper specimen collection/handling, submission of specimen other than nasopharyngeal swab, presence of viral mutation(s) within the areas targeted by this assay, and inadequate number of viral copies(<138 copies/mL). A negative result must be combined with clinical observations, patient history, and epidemiological information. The expected result is Negative.  Fact Sheet for Patients:  BloggerCourse.com  Fact Sheet for Healthcare Providers:  SeriousBroker.it  This test is no t yet approved or cleared by the [place] FDA and  has been authorized for detection and/or diagnosis of SARS-CoV-2 by FDA under an Emergency Use Authorization (EUA). This EUA will remain  in effect (meaning this test can be used) for the duration of the COVID-19 declaration under Section 564(b)(1) of the Act, 21 U.S.C.section 360bbb-3(b)(1), unless the authorization is terminated  or revoked  sooner.       Influenza A by PCR NEGATIVE NEGATIVE Final   Influenza B by PCR NEGATIVE NEGATIVE Final    Comment: (NOTE) The Xpert Xpress SARS-CoV-2/FLU/RSV plus assay is intended as an aid in the diagnosis of influenza from Nasopharyngeal swab specimens and should not be used as a sole basis for treatment. Nasal washings and aspirates are unacceptable for Xpert Xpress SARS-CoV-2/FLU/RSV testing.  Fact Sheet for Patients: BloggerCourse.com  Fact Sheet for Healthcare Providers: SeriousBroker.it  This test is not yet approved or cleared by the [place] FDA and has been authorized for detection and/or diagnosis of SARS-CoV-2 by FDA under an Emergency Use Authorization (EUA). This EUA will remain in effect (meaning this test can be used) for the duration of the COVID-19 declaration under Section 564(b)(1) of the Act, 21 U.S.C. section 360bbb-3(b)(1), unless the authorization is terminated or revoked.  Performed at [facility], [address] [place], [place] [address]     Radiology Studies: No results found.    Deadrian Toya T. Yatziry Deakins [facility]  If 7PM-7AM, please contact night-coverage [email or web address] 11/26/2020, 2:39 PM

## 2020-11-26 NOTE — Evaluation (Signed)
Physical Therapy Evaluation Patient Details Name: Angela Singh MRN: [id number] DOB: 12/11/1950 Today's Date: 11/26/2020  History of Present Illness  Pt is a 70 y/o F admitted on 11/25/20 with c/c of SOB. Pt is being treated for Acute respiratory failure with hypoxia due to acute on chronic systolic CHF (congestive heart failure). PMH: sCHF, HTN, HLD, COPD, GERD, depression, tobacco abuse, a-fib, thrombus of L atrial appendage on eliquis, tricuspid valve regurgitation  Clinical Impression  MD cleared pt for participation in PT.  Pt seen for PT evaluation with pt on 2L/min via nasal cannula throughout session with lowest SpO2 reading of 88% with pt able to recover quickly. Pt reports she lives alone in mobile home with 5-6 steps to enter and is independent with mobility without AD. On this date, pt is independent with gait in hallway without AD but does require standing rest break during 2/2 increasing HR that does decrease. At this time pt does not demonstrate acute PT needs. PT to sign off, please re-consult if new needs arise.  HR 110-154 bpm during session Pt noted to have BUE & trunk "shaking" but unable to describe how long she's had them, does not appear to be aware of it and eventually states "I think it's the medicine they are giving me".     Recommendations for follow up therapy are one component of a multi-disciplinary discharge planning process, led by the attending physician.  Recommendations may be updated based on patient status, additional functional criteria and insurance authorization.  Follow Up Recommendations No PT follow up    Equipment Recommendations  None recommended by PT    Recommendations for Other Services       Precautions / Restrictions Precautions Precautions: None Restrictions Weight Bearing Restrictions: No      Mobility  Bed Mobility Overal bed mobility: Independent                  Transfers Overall transfer level: Independent                   Ambulation/Gait Ambulation/Gait assistance: Independent Gait Distance (Feet): 100 Feet Assistive device: None Gait Pattern/deviations: WFL(Within Functional Limits) Gait velocity: WNL      Stairs            Wheelchair Mobility    Modified Rankin (Stroke Patients Only)       Balance Overall balance assessment: Modified Independent                                           Pertinent Vitals/Pain Pain Assessment: No/denies pain    Home Living Family/patient expects to be discharged to:: Private residence Living Arrangements: Alone   Type of Home: Mobile home Home Access: Stairs to enter Entrance Stairs-Rails: Right;Left;Can reach both Entrance Stairs-Number of Steps: 5-6 Home Layout: One level Home Equipment: None      Prior Function Level of Independence: Independent         Comments: Independent, driving, no falls in past 6 months     Hand Dominance        Extremity/Trunk Assessment   Upper Extremity Assessment Upper Extremity Assessment: Overall WFL for tasks assessed    Lower Extremity Assessment Lower Extremity Assessment: Overall WFL for tasks assessed       Communication   Communication: No difficulties  Cognition Arousal/Alertness: Awake/alert Behavior During Therapy: Flat affect Overall Cognitive Status:  Within Functional Limits for tasks assessed                                        General Comments      Exercises     Assessment/Plan    PT Assessment Patent does not need any further PT services  PT Problem List         PT Treatment Interventions      PT Goals (Current goals can be found in the Care Plan section)  Acute Rehab PT Goals Patient Stated Goal: none stated PT Goal Formulation: With patient Time For Goal Achievement: 12/10/20 Potential to Achieve Goals: Good    Frequency     Barriers to discharge        Co-evaluation               AM-PAC  PT "6 Clicks" Mobility  Outcome Measure Help needed turning from your back to your side while in a flat bed without using bedrails?: None Help needed moving from lying on your back to sitting on the side of a flat bed without using bedrails?: None Help needed moving to and from a bed to a chair (including a wheelchair)?: None Help needed standing up from a chair using your arms (e.g., wheelchair or bedside chair)?: None Help needed to walk in hospital room?: None Help needed climbing 3-5 steps with a railing? : None 6 Click Score: 24    End of Session Equipment Utilized During Treatment: Oxygen Activity Tolerance: Patient tolerated treatment well Patient left: in bed;with call bell/phone within reach        Time: 1014-1028 PT Time Calculation (min) (ACUTE ONLY): 14 min   Charges:   PT Evaluation $PT Eval Low Complexity: 1 Low          Aleda Grana, PT, DPT 11/26/20, 11:39 AM   Sandi Mariscal 11/26/2020, 11:37 AM

## 2020-11-27 LAB — RENAL FUNCTION PANEL
Albumin: 2.9 g/dL — ABNORMAL LOW (ref 3.5–5.0)
Anion gap: 7 (ref 5–15)
BUN: 16 mg/dL (ref 8–23)
CO2: 29 mmol/L (ref 22–32)
Calcium: 8.4 mg/dL — ABNORMAL LOW (ref 8.9–10.3)
Chloride: 100 mmol/L (ref 98–111)
Creatinine, Ser: 0.87 mg/dL (ref 0.44–1.00)
GFR, Estimated: >60 mL/min (ref >60)
Glucose, Bld: 109 mg/dL — ABNORMAL HIGH (ref 70–99)
Phosphorus: 2.3 mg/dL — ABNORMAL LOW (ref 2.5–4.6)
Potassium: 4.5 mmol/L (ref 3.5–5.1)
Sodium: 136 mmol/L (ref 135–145)

## 2020-11-27 LAB — CBC
HCT: 38.8 % (ref 36.0–46.0)
Hemoglobin: 13.2 g/dL (ref 12.0–15.0)
MCH: 33.5 pg (ref 26.0–34.0)
MCHC: 34 g/dL (ref 30.0–36.0)
MCV: 98.5 fL (ref 80.0–100.0)
Platelets: 193 10*3/uL (ref 150–400)
RBC: 3.94 MIL/uL (ref 3.87–5.11)
RDW: 12.9 % (ref 11.5–15.5)
WBC: 6.2 10*3/uL (ref 4.0–10.5)
nRBC: 0 % (ref 0.0–0.2)

## 2020-11-27 LAB — BRAIN NATRIURETIC PEPTIDE: B Natriuretic Peptide: 826.7 pg/mL — ABNORMAL HIGH (ref 0.0–100.0)

## 2020-11-27 LAB — MAGNESIUM: Magnesium: 2.1 mg/dL (ref 1.7–2.4)

## 2020-11-27 LAB — TSH: TSH: 4.555 u[IU]/mL — ABNORMAL HIGH (ref 0.350–4.500)

## 2020-11-27 MED ORDER — METOPROLOL TARTRATE 100 MG PO TABS: 100.0000 mg | ORAL_TABLET | Freq: Two times a day (BID) | ORAL | 11 refills | Status: DC

## 2020-11-27 NOTE — Discharge Summary (Signed)
Angela Singh AST:[id number] DOB: 12/24/1950 DOA: 11/25/2020  PCP: Oswaldo Conroy, MD  Admit date: 11/25/2020 Discharge date: 11/27/2020  Time spent: 40 minutes  Recommendations for Outpatient Follow-up:  Cardiology f/u 1 week  BMP at f/u (was hypokalemic on presentation)    Discharge Diagnoses:  Principal Problem:   Acute on chronic systolic CHF (congestive heart failure) (HCC) Active Problems:   Essential hypertension   Hyperlipidemia   Tobacco abuse   Depression   COPD (chronic obstructive pulmonary disease) (HCC)   Thrombus of left atrial appendage   Atrial fibrillation, chronic (HCC)   Acute respiratory failure with hypoxia (HCC)   Discharge Condition: stable  Diet recommendation: heart healthy  Filed Weights   11/25/20 0336 11/26/20 1306  Weight: 49.4 kg 49.9 kg    History of present illness:  HPI: Angela Singh is a 70 y.o. 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   Patient states that her shortness started last night, which has been progressively worsening.  She has cough with clear mucus production.  Denies chest pain, fever or chills.  Patient does not have nausea, vomiting, diarrhea or abdominal pain.  No symptoms of UTI.  Patient is not using oxygen at home, but was found to have oxygen desaturating to 85% on room air, which improved to 97% on 2 L oxygen in the ED.  Hospital Course:  Patient with recent diagnosis of a fib s/p cardioversion, presenting with acute hypoxic respiratory failure 2/2 chf exacerbation. Found to be in a fib with rvr. Treated with rate control and diuresis. By day of discharged weaned off o2 and respiratory status at baseline. Home cardizem and amiodarone continued, metoprolol dose increased to 100 bid.  Rate better controlled in low 100s on day of discharge. Plan for this increased dose of metoprolol, continue other home meds, and close f/u with cardiology in one week.  Procedures: none   Consultations: none  Discharge Exam: Vitals:   11/27/20 0804 11/27/20 0853  BP:  108/80  Pulse:  94  Resp:  17  Temp:  97.8 F (36.6 C)  SpO2: 93% 95%    General: NAD Cardiovascular: irreg irreg, mild tachycardia, mild systolic murmur Respiratory: CTAB EXT: no edema  Discharge Instructions   Discharge Instructions     Diet - low sodium heart healthy   Complete by: As directed    Increase activity slowly   Complete by: As directed       Allergies as of 11/27/2020       Reactions   Codeine Other (See Comments)   weakness        Medication List     TAKE these medications    albuterol 108 (90 Base) MCG/ACT inhaler Commonly known as: VENTOLIN HFA Inhale 2 puffs into the lungs every 4 (four) hours as needed.   amiodarone 200 MG tablet Commonly known as: PACERONE Take 1 tablet (200 mg total) by mouth 2 (two) times daily.   apixaban 5 MG Tabs tablet Commonly known as: ELIQUIS Take 1 tablet (5 mg total) by mouth 2 (two) times daily.   ascorbic acid 500 MG tablet Commonly known as: VITAMIN C Take 1,000 mg by mouth 2 (two) times daily.   budesonide-formoterol 160-4.5 MCG/ACT inhaler Commonly known as: SYMBICORT Inhale 1 puff into the lungs 2 (two) times daily in the am  and at bedtime.Marland Kitchen   diltiazem 180 MG 24 hr capsule Commonly known as: CARDIZEM CD Take 1 capsule (180 mg total) by mouth daily.   FLUoxetine 40 MG capsule Commonly known as: PROZAC Take 40 mg by mouth daily.   furosemide 40 MG tablet Commonly known as: LASIX Take 1.5 tablets (60 mg total) by mouth daily.   metoprolol tartrate 100 MG tablet Commonly known as: LOPRESSOR Take 1 tablet (100 mg total) by mouth 2 (two) times daily. What changed:  medication strength how much to take additional  instructions   nicotine 21 mg/24hr patch Commonly known as: NICODERM CQ - dosed in mg/24 hours Place 1 patch (21 mg total) onto the skin daily.   ONE-A-DAY WOMENS 50+ PO Take 1 tablet by mouth daily.   pantoprazole 40 MG tablet Commonly known as: PROTONIX Take 40 mg by mouth daily.   potassium chloride SA 20 MEQ tablet Commonly known as: KLOR-CON Take 1.5 tablets (30 mEq total) by mouth daily.   vitamin E 180 MG (400 UNITS) capsule Take 400 Units by mouth 2 (two) times daily.       Allergies  Allergen Reactions   Codeine Other (See Comments)    weakness    Follow-up Information     Gollan, Tollie Pizza, MD Follow up.   Specialty: Cardiology Why: on November 1st as scheduled Contact information: [address] [place] [address] [phone]         Oswaldo Conroy, MD Follow up.   Specialty: Family Medicine Contact information: [address] [place] [address] [phone]                  The results of significant diagnostics from this hospitalization (including imaging, microbiology, ancillary and laboratory) are listed below for reference.    Significant Diagnostic Studies: DG Chest 2 View  Result Date: 11/25/2020 CLINICAL DATA:  Shortness of breath EXAM: CHEST - 2 VIEW COMPARISON:  11/02/2020 FINDINGS: The heart remains at the upper limit of normal in size. Redemonstrated diffuse interstitial prominence, with somewhat decreased vascular congestion. Possible trace right pleural effusion. No acute osseous abnormality. IMPRESSION: Diffuse interstitial prominence, which could reflect edema or chronic lung disease, with likely trace right pleural effusion versus pleural thickening. Electronically Signed   By: Wiliam Ke M.D.   On: 11/25/2020 03:56   DG Chest Port 1 View  Result Date: 11/02/2020 CLINICAL DATA:  Shortness of breath EXAM: PORTABLE CHEST 1 VIEW COMPARISON:  10/29/2020 FINDINGS: Heart is upper  limits normal in size. Diffuse interstitial prominence throughout the lungs with mild vascular congestion. Suspect small bilateral effusions. Bibasilar atelectasis. IMPRESSION: Diffuse interstitial prominence could reflect interstitial edema or chronic lung disease. Small bilateral effusions with bibasilar atelectasis. Electronically Signed   By: Charlett Nose M.D.   On: 11/02/2020 10:07   DG Chest Portable 1 View  Result Date: 10/29/2020 CLINICAL DATA:  Shortness of breath. History of atrial fibrillation. EXAM: PORTABLE CHEST 1 VIEW COMPARISON:  10/26/2020 FINDINGS: Heart size and mediastinal contours are unremarkable. Aortic atherosclerosis. No pleural effusion identified. Diffuse increased scratch set increased pulmonary vascularity is noted. No superimposed airspace consolidation. IMPRESSION: Pulmonary vascular congestion. Electronically Signed   By: Signa Kell M.D.   On: 10/29/2020 09:23   ECHOCARDIOGRAM COMPLETE  Result Date: 11/25/2020    ECHOCARDIOGRAM REPORT   Patient Name:   Angela Singh Date of Exam: 11/25/2020 Medical Rec #:  [id number]      Height:  64.0 in Accession #:    [id number]     Weight:       109.0 lb Date of Birth:  06-Feb-1950      BSA:          1.512 m Patient Age:    70 years       BP:           105/62 mmHg Patient Gender: F              HR:           96 bpm. Exam Location:  [facility] Procedure: 2D Echo Indications:     Systolic CHF  History:         Patient has prior history of Echocardiogram examinations. CHF,                  COPD, Arrythmias:Atrial Fibrillation; Risk Factors:Hypertension                  and Current Smoker.  Sonographer:     Hilbert Corrigan Thornton-Maynard Referring Phys:  Wynona Neat NIU Diagnosing Phys: Julien Nordmann MD IMPRESSIONS  1. Left ventricular ejection fraction, by estimation, is 60 to 65%. The left ventricle has normal function. The left ventricle has no regional wall motion abnormalities. Left ventricular diastolic parameters are indeterminate.  2.  Right ventricular systolic function is normal. The right ventricular size is normal. There is normal pulmonary artery systolic pressure. The estimated right ventricular systolic pressure is 31.8 mmHg.  3. Left atrial size was moderately dilated.  4. The mitral valve is normal in structure. Moderate mitral valve regurgitation. No evidence of mitral stenosis.  5. Tricuspid valve regurgitation is moderate.  6. The aortic valve is normal in structure. Aortic valve regurgitation is not visualized. No aortic stenosis is present.  7. The inferior vena cava is normal in size with <50% respiratory variability, suggesting right atrial pressure of 8 mmHg.  8. A small pericardial effusion is present.  9. Rhythm is atrial fibrillation FINDINGS  Left Ventricle: Left ventricular ejection fraction, by estimation, is 60 to 65%. The left ventricle has normal function. The left ventricle has no regional wall motion abnormalities. The left ventricular internal cavity size was normal in size. There is  no left ventricular hypertrophy. Left ventricular diastolic parameters are indeterminate. Right Ventricle: The right ventricular size is normal. No increase in right ventricular wall thickness. Right ventricular systolic function is normal. There is normal pulmonary artery systolic pressure. The tricuspid regurgitant velocity is 2.44 m/s, and  with an assumed right atrial pressure of 8 mmHg, the estimated right ventricular systolic pressure is 31.8 mmHg. Left Atrium: Left atrial size was moderately dilated. Right Atrium: Right atrial size was normal in size. Pericardium: A small pericardial effusion is present. Mitral Valve: The mitral valve is normal in structure. Moderate mitral valve regurgitation. No evidence of mitral valve stenosis. MV peak gradient, 3.8 mmHg. The mean mitral valve gradient is 2.0 mmHg. Tricuspid Valve: The tricuspid valve is normal in structure. Tricuspid valve regurgitation is moderate . No evidence of tricuspid  stenosis. Aortic Valve: The aortic valve is normal in structure. Aortic valve regurgitation is not visualized. No aortic stenosis is present. Aortic valve mean gradient measures 2.0 mmHg. Aortic valve peak gradient measures 3.2 mmHg. Aortic valve area, by VTI measures 1.72 cm. Pulmonic Valve: The pulmonic valve was normal in structure. Pulmonic valve regurgitation is not visualized. No evidence of pulmonic stenosis. Aorta: The aortic root is normal in size and structure.  Venous: The inferior vena cava is normal in size with less than 50% respiratory variability, suggesting right atrial pressure of 8 mmHg. IAS/Shunts: No atrial level shunt detected by color flow Doppler.  LEFT VENTRICLE PLAX 2D LVIDd:         4.70 cm     Diastology LVIDs:         3.50 cm     LV e' medial:    7.62 cm/s LV PW:         1.00 cm     LV E/e' medial:  9.0 LV IVS:        0.90 cm     LV e' lateral:   9.57 cm/s LVOT diam:     1.70 cm     LV E/e' lateral: 7.2 LV SV:         26 LV SV Index:   17 LVOT Area:     2.27 cm  LV Volumes (MOD) LV vol d, MOD A2C: 28.2 ml LV vol d, MOD A4C: 37.2 ml LV vol s, MOD A2C: 10.0 ml LV vol s, MOD A4C: 15.1 ml LV SV MOD A2C:     18.2 ml LV SV MOD A4C:     37.2 ml LV SV MOD BP:      21.4 ml RIGHT VENTRICLE RV S prime:     9.79 cm/s TAPSE (M-mode): 1.5 cm LEFT ATRIUM           Index LA diam:      4.20 cm 2.78 cm/m LA Vol (A2C): 37.8 ml 25.01 ml/m LA Vol (A4C): 81.3 ml 53.79 ml/m  AORTIC VALVE AV Area (Vmax):    1.78 cm AV Area (Vmean):   1.70 cm AV Area (VTI):     1.72 cm AV Vmax:           88.90 cm/s AV Vmean:          64.500 cm/s AV VTI:            0.152 m AV Peak Grad:      3.2 mmHg AV Mean Grad:      2.0 mmHg LVOT Vmax:         69.55 cm/s LVOT Vmean:        48.450 cm/s LVOT VTI:          0.116 m LVOT/AV VTI ratio: 0.76  AORTA Ao Root diam: 2.80 cm MITRAL VALVE                  TRICUSPID VALVE MV Area (PHT): 3.99 cm       TR Peak grad:   23.8 mmHg MV Area VTI:   2.46 cm       TR Vmax:        244.00 cm/s  MV Peak grad:  3.8 mmHg MV Mean grad:  2.0 mmHg       SHUNTS MV Vmax:       0.97 m/s       Systemic VTI:  0.12 m MV Vmean:      66.2 cm/s      Systemic Diam: 1.70 cm MV Decel Time: 190 msec MR Peak grad:    69.9 mmHg MR Mean grad:    49.0 mmHg MR Vmax:         418.00 cm/s MR Vmean:        338.0 cm/s MR PISA:         3.08 cm MR PISA Eff ROA: 41 mm MR PISA Radius:  0.70 cm MV E velocity: 68.60 cm/s Julien Nordmann MD Electronically signed by Julien Nordmann MD Signature Date/Time: 11/25/2020/4:34:08 PM    Final     Microbiology: Recent Results (from the past 240 hour(s))  Resp Panel by RT-PCR (Flu A&B, Covid) Nasopharyngeal Swab     Status: None   Collection Time: 11/25/20  8:37 AM   Specimen: Nasopharyngeal Swab; Nasopharyngeal(NP) swabs in vial transport medium  Result Value Ref Range Status   SARS Coronavirus 2 by RT PCR NEGATIVE NEGATIVE Final    Comment: (NOTE) SARS-CoV-2 target nucleic acids are NOT DETECTED.  The SARS-CoV-2 RNA is generally detectable in upper respiratory specimens during the acute phase of infection. The lowest concentration of SARS-CoV-2 viral copies this assay can detect is 138 copies/mL. A negative result does not preclude SARS-Cov-2 infection and should not be used as the sole basis for treatment or other patient management decisions. A negative result may occur with  improper specimen collection/handling, submission of specimen other than nasopharyngeal swab, presence of viral mutation(s) within the areas targeted by this assay, and inadequate number of viral copies(<138 copies/mL). A negative result must be combined with clinical observations, patient history, and epidemiological information. The expected result is Negative.  Fact Sheet for Patients:  BloggerCourse.com  Fact Sheet for Healthcare Providers:  SeriousBroker.it  This test is no t yet approved or cleared by the [place] FDA and  has been  authorized for detection and/or diagnosis of SARS-CoV-2 by FDA under an Emergency Use Authorization (EUA). This EUA will remain  in effect (meaning this test can be used) for the duration of the COVID-19 declaration under Section 564(b)(1) of the Act, 21 U.S.C.section 360bbb-3(b)(1), unless the authorization is terminated  or revoked sooner.       Influenza A by PCR NEGATIVE NEGATIVE Final   Influenza B by PCR NEGATIVE NEGATIVE Final    Comment: (NOTE) The Xpert Xpress SARS-CoV-2/FLU/RSV plus assay is intended as an aid in the diagnosis of influenza from Nasopharyngeal swab specimens and should not be used as a sole basis for treatment. Nasal washings and aspirates are unacceptable for Xpert Xpress SARS-CoV-2/FLU/RSV testing.  Fact Sheet for Patients: BloggerCourse.com  Fact Sheet for Healthcare Providers: SeriousBroker.it  This test is not yet approved or cleared by the [place] FDA and has been authorized for detection and/or diagnosis of SARS-CoV-2 by FDA under an Emergency Use Authorization (EUA). This EUA will remain in effect (meaning this test can be used) for the duration of the COVID-19 declaration under Section 564(b)(1) of the Act, 21 U.S.C. section 360bbb-3(b)(1), unless the authorization is terminated or revoked.  Performed at [facility], [address] [place], [place] [address]      Labs: Basic Metabolic Panel: Recent Labs  Lab 11/25/20 0340 11/26/20 0610 11/27/20 0529  NA 134* 136 136  K 4.1 3.3* 4.5  CL 95* 94* 100  CO2 29 32 29  GLUCOSE 160* 119* 109*  BUN 15 18 16   CREATININE 1.03* 0.94 0.87  CALCIUM 8.9 8.3* 8.4*  MG  --  1.8 2.1  PHOS  --   --  2.3*   Liver Function Tests: Recent Labs  Lab 11/27/20 0529  ALBUMIN 2.9*   No results for input(s): LIPASE, AMYLASE in the last 168 hours. No results for input(s): AMMONIA in the last 168 hours. CBC: Recent Labs  Lab  11/25/20 0340 11/27/20 0529  WBC 9.2 6.2  HGB 14.1 13.2  HCT 40.9 38.8  MCV 99.3 98.5  PLT  186 193   Cardiac Enzymes: No results for input(s): CKTOTAL, CKMB, CKMBINDEX, TROPONINI in the last 168 hours. BNP: BNP (last 3 results) Recent Labs    10/29/20 0836 11/25/20 0340 11/27/20 0529  BNP 1,282.1* 973.1* 826.7*    ProBNP (last 3 results) No results for input(s): PROBNP in the last 8760 hours.  CBG: No results for input(s): GLUCAP in the last 168 hours.     Signed:  Silvano Bilis MD.  [facility] 11/27/2020, 9:13 AM

## 2020-11-27 NOTE — Progress Notes (Signed)
CSW notes heart failure consult, informed heart failure RN Jimsey.   No other needs identified at this time.   [place], [place] [phone]

## 2020-11-29 ENCOUNTER — Other Ambulatory Visit: Payer: Medicare HMO

## 2020-11-30 ENCOUNTER — Other Ambulatory Visit: Payer: Self-pay | Admitting: Family

## 2020-11-30 ENCOUNTER — Other Ambulatory Visit
Admission: RE | Admit: 2020-11-30 | Discharge: 2020-11-30 | Disposition: A | Discharge status: Home / Self Care | Payer: Medicare HMO | Source: Ambulatory Visit | Attending: Family | Admitting: Family

## 2020-11-30 ENCOUNTER — Other Ambulatory Visit: Payer: Self-pay

## 2020-11-30 DIAGNOSIS — Z01812 Encounter for preprocedural laboratory examination: Secondary | ICD-10-CM | POA: Diagnosis present

## 2020-11-30 LAB — BASIC METABOLIC PANEL
Anion gap: 10 (ref 5–15)
BUN: 16 mg/dL (ref 8–23)
CO2: 30 mmol/L (ref 22–32)
Calcium: 9.7 mg/dL (ref 8.9–10.3)
Chloride: 101 mmol/L (ref 98–111)
Creatinine, Ser: 0.9 mg/dL (ref 0.44–1.00)
GFR, Estimated: >60 mL/min (ref >60)
Glucose, Bld: 91 mg/dL (ref 70–99)
Potassium: 4.3 mmol/L (ref 3.5–5.1)
Sodium: 141 mmol/L (ref 135–145)

## 2020-12-04 ENCOUNTER — Telehealth: Payer: Self-pay | Admitting: Nurse Practitioner

## 2020-12-04 MED ORDER — APIXABAN 5 MG PO TABS: 5.0000 mg | ORAL_TABLET | Freq: Two times a day (BID) | ORAL | 1 refills | Status: DC

## 2020-12-04 NOTE — Telephone Encounter (Signed)
*  STAT* If patient is at the pharmacy, call can be transferred to refill team.   1. Which medications need to be refilled? (please list name of each medication and dose if known) eliquis 5 mg po BID  2. Which pharmacy/location (including street and city if local pharmacy) is medication to be sent to? [facility]   3. Do they need a 30 day or 90 day supply? 90 would like more if able

## 2020-12-04 NOTE — Telephone Encounter (Signed)
Prescription refill request for Eliquis received. Indication:afib Last office visit:berge 11/16/20 Scr: 0.9 11/30/20 Age: 53f Weight:47.4kg

## 2020-12-05 ENCOUNTER — Ambulatory Visit (INDEPENDENT_AMBULATORY_CARE_PROVIDER_SITE_OTHER): Payer: Medicare HMO | Admitting: Cardiovascular Disease

## 2020-12-05 ENCOUNTER — Other Ambulatory Visit: Payer: Self-pay

## 2020-12-05 ENCOUNTER — Encounter: Payer: Self-pay | Admitting: Cardiovascular Disease

## 2020-12-05 VITALS — BP 110/70 | HR 89 | Ht 64.0 in | Wt 110.0 lb

## 2020-12-05 DIAGNOSIS — I5032 Chronic diastolic (congestive) heart failure: Secondary | ICD-10-CM

## 2020-12-05 DIAGNOSIS — I10 Essential (primary) hypertension: Secondary | ICD-10-CM | POA: Diagnosis not present

## 2020-12-05 DIAGNOSIS — I4819 Other persistent atrial fibrillation: Secondary | ICD-10-CM | POA: Diagnosis not present

## 2020-12-05 DIAGNOSIS — E782 Mixed hyperlipidemia: Secondary | ICD-10-CM | POA: Diagnosis not present

## 2020-12-05 MED ORDER — FUROSEMIDE 40 MG PO TABS: 60.0000 mg | ORAL_TABLET | Freq: Every day | ORAL | 3 refills | Status: DC

## 2020-12-05 MED ORDER — DILTIAZEM HCL ER COATED BEADS 180 MG PO CP24: 180.0000 mg | ORAL_CAPSULE | Freq: Every day | ORAL | 3 refills | Status: DC

## 2020-12-05 NOTE — Progress Notes (Signed)
Cardiology Office Note  Date:  12/05/2020   ID:  Angela Singh, DOB 08-Apr-1950, MRN [id number]  PCP:  Oswaldo Conroy, MD   Chief Complaint  Patient presents with   3 week follow up s/p cardioversion.     Patient c/o shortness of breath with little exertion. Medications reviewed by the patient verbally.      HPI:  70 year old female with history of  Persistent atrial fibrillation/with RVR hypertension, hyperlipidemia,  tobacco abuse, COPD, and GERD,  who presents for follow-up related to recurrent atrial fibrillation and heart failure with midrange ejection fraction (EF 45-50% September 2022).  Last seen in clinic providers November 16, 2020 Very difficult to control atrial fibrillation secondary to RVR, multiple hospitalizations, Also admissions for diastolic CHF in the setting of atrial fibrillation  August 2022, in the hospital A. Fib RVR.  controlled on IV diltiazem, IV amiodarone, IV digoxin, and oral beta-blocker.  Echo showed an EF of 60 to 65% with mild to moderate mitral regurgitation.  TEE on August 22 left atrial appendage thrombus.   September 7 showed a digoxin level of 2.7  sent to the hospital  in A. Fib with a slow response in the 50s. hypotensive.  Hypokalemic  September 19, she complained of fatigue.  in A. Fib with RVR.   TEE and cardioversion,  cardioversion was successful.   September 25 with recurrent A. Fib with RVR and heart failure.  In the hospital cardioversion on September 28 reverted back to atrial fibrillation by September 29.    cardioversion November 23, 2020 Normal sinus rhythm restored, Follow-up in the hospital November 25, 2020, back in atrial fibrillation  Still SOB, low energy Does not feel herself, again would like to restore normal sinus rhythm given the way she feels She does report feeling better when she was normal sinus rhythm EKG personally reviewed by myself on todays visit Atrial fibrillation ventricular rate 89 bpm  nonspecific ST abnormality  PMH:   has a past medical history of Chronic combined systolic (congestive) and diastolic (congestive) heart failure (HCC), COPD (chronic obstructive pulmonary disease) (HCC), Essential hypertension, GERD (gastroesophageal reflux disease), Mitral regurgitation, Mixed hyperlipidemia, Persistent atrial fibrillation (HCC), Thrombus of left atrial appendage, Tobacco abuse, and Tricuspid regurgitation.  PSH:    Past Surgical History:  Procedure Laterality Date   CARDIOVERSION N/A 10/26/2020   Procedure: CARDIOVERSION;  Surgeon: Antonieta Iba, MD;  Location: [facility];  Service: Cardiovascular;  Laterality: N/A;   CARDIOVERSION N/A 11/01/2020   Procedure: CARDIOVERSION;  Surgeon: Debbe Odea, MD;  Location: [facility];  Service: Cardiovascular;  Laterality: N/A;   CARDIOVERSION N/A 11/23/2020   Procedure: CARDIOVERSION;  Surgeon: Antonieta Iba, MD;  Location: [facility];  Service: Cardiovascular;  Laterality: N/A;   TEE WITHOUT CARDIOVERSION N/A 09/25/2020   Procedure: TRANSESOPHAGEAL ECHOCARDIOGRAM (TEE);  Surgeon: Iran Ouch, MD;  Location: [facility];  Service: Cardiovascular;  Laterality: N/A;   TEE WITHOUT CARDIOVERSION N/A 10/26/2020   Procedure: TRANSESOPHAGEAL ECHOCARDIOGRAM (TEE);  Surgeon: Antonieta Iba, MD;  Location: [facility];  Service: Cardiovascular;  Laterality: N/A;    Current Outpatient Medications  Medication Sig Dispense Refill   albuterol (VENTOLIN HFA) 108 (90 Base) MCG/ACT inhaler Inhale 2 puffs into the lungs every 4 (four) hours as needed.     amiodarone (PACERONE) 200 MG tablet Take 1 tablet (200 mg total) by mouth 2 (two) times daily. 60 tablet 1   apixaban (ELIQUIS) 5 MG TABS tablet Take 1 tablet (5 mg total) by  mouth 2 (two) times daily. 180 tablet 1   ascorbic acid (VITAMIN C) 500 MG tablet Take 1,000 mg by mouth 2 (two) times daily.     budesonide-formoterol (SYMBICORT) 160-4.5 MCG/ACT inhaler Inhale 1 puff into the lungs 2  (two) times daily in the am and at bedtime.Marland Kitchen     FLUoxetine (PROZAC) 40 MG capsule Take 40 mg by mouth daily.     metoprolol tartrate (LOPRESSOR) 100 MG tablet Take 1 tablet (100 mg total) by mouth 2 (two) times daily. 60 tablet 11   Multiple Vitamins-Minerals (ONE-A-DAY WOMENS 50+ PO) Take 1 tablet by mouth daily.     pantoprazole (PROTONIX) 40 MG tablet Take 40 mg by mouth daily.     potassium chloride SA (KLOR-CON) 20 MEQ tablet Take 1.5 tablets (30 mEq total) by mouth daily. 90 tablet 0   vitamin E 180 MG (400 UNITS) capsule Take 400 Units by mouth 2 (two) times daily.     diltiazem (CARDIZEM CD) 180 MG 24 hr capsule Take 1 capsule (180 mg total) by mouth daily. 91 capsule 3   furosemide (LASIX) 40 MG tablet Take 1.5 tablets (60 mg total) by mouth daily. May take additional 40 mg for swelling or shortness of breath 150 tablet 3   nicotine (NICODERM CQ - DOSED IN MG/24 HOURS) 21 mg/24hr patch Place 1 patch (21 mg total) onto the skin daily. (Patient not taking: Reported on 12/05/2020) 28 patch 0   No current facility-administered medications for this visit.    Allergies:   Codeine   Social History:  The patient  reports that she has been smoking cigarettes. She has a 24.00 pack-year smoking history. She has never used smokeless tobacco. She reports that she does not currently use alcohol. She reports that she does not use drugs.   Family History:   family history includes COPD in her mother; Heart disease in her sister; Heart failure in her father.    Review of Systems: Review of Systems  Constitutional: Negative.   HENT: Negative.    Respiratory:  Positive for shortness of breath.   Cardiovascular:  Positive for palpitations.  Gastrointestinal: Negative.   Musculoskeletal: Negative.   Neurological: Negative.   Psychiatric/Behavioral: Negative.    All other systems reviewed and are negative.   PHYSICAL EXAM: VS:  BP 110/70 (BP Location: Left Arm, Patient Position: Sitting, Cuff  Size: Normal)   Pulse 89   Ht 5\' 4"  (1.626 m)   Wt 110 lb (49.9 kg)   SpO2 96%   BMI 18.88 kg/m  , BMI Body mass index is 18.88 kg/m. GEN: Well nourished, well developed, in no acute distress HEENT: normal Neck: no JVD, carotid bruits, or masses Cardiac: Irregularly irregular no murmurs, rubs, or gallops,no edema  Respiratory:  clear to auscultation bilaterally, normal work of breathing GI: soft, nontender, nondistended, + BS MS: no deformity or atrophy Skin: warm and dry, no rash Neuro:  Strength and sensation are intact Psych: euthymic mood, full affect   Recent Labs: 11/16/2020: ALT 125 11/27/2020: B Natriuretic Peptide 826.7; Hemoglobin 13.2; Magnesium 2.1; Platelets 193; TSH 4.555 11/30/2020: BUN 16; Creatinine, Ser 0.90; Potassium 4.3; Sodium 141    Lipid Panel Lab Results  Component Value Date   CHOL 161 10/13/2020   HDL 54 10/13/2020   LDLCALC 93 10/13/2020   TRIG 68 10/13/2020      Wt Readings from Last 3 Encounters:  12/05/20 110 lb (49.9 kg)  11/26/20 109 lb 14.4 oz (49.9 kg)  11/23/20  105 lb (47.6 kg)     ASSESSMENT AND PLAN:  Problem List Items Addressed This Visit       Cardiology Problems   Essential hypertension   Relevant Medications   furosemide (LASIX) 40 MG tablet   diltiazem (CARDIZEM CD) 180 MG 24 hr capsule   Hyperlipidemia   Relevant Medications   furosemide (LASIX) 40 MG tablet   diltiazem (CARDIZEM CD) 180 MG 24 hr capsule   Persistent atrial fibrillation (HCC) - Primary   Relevant Medications   furosemide (LASIX) 40 MG tablet   diltiazem (CARDIZEM CD) 180 MG 24 hr capsule   Other Relevant Orders   EKG 12-Lead   Ambulatory referral to Cardiac Electrophysiology   Chronic diastolic CHF (congestive heart failure) (HCC)   Relevant Medications   furosemide (LASIX) 40 MG tablet   diltiazem (CARDIZEM CD) 180 MG 24 hr capsule   Atrial fibrillation, persistent Very difficult time is here with frequent hospital admissions, difficult  to control atrial fibrillation secondary to rapid rate Challenging hospital course was with up titration of medications limited by hypotension -She is now failed amiodarone after long load, several cardioversions, quickly back into atrial fibrillation with associated symptoms shortness of breath weakness tired Long discussion with her concerning various treatment options, medical management versus further attempts to restore normal sinus rhythm -We will refer to EP Consider ablation given difficult to control rate, symptoms in atrial fibrillation, frequent hospitalizations, young age -Continue amiodarone for now but realizes may need to be stopped when she sees EP Amiodarone likely not a good medication for her given underlying COPD  Chronic diastolic CHF Continue Lasix 60 daily, extra 40 in the afternoon for any shortness of breath weight gain Several hospital admissions for acute on chronic diastolic CHF in the setting of atrial fibrillation Ejection fraction 60%   Total encounter time more than 25 minutes  Greater than 50% was spent in counseling and coordination of care with the patient    Signed, Dossie Arbour, M.D., Ph.D. [facility] [place], [place] [phone]

## 2020-12-05 NOTE — Patient Instructions (Addendum)
Referral to Dr. Lalla Brothers   Medication Instructions:  Lasix 60 daily (1 1/2 pills) May take extra 40 mg in the afternoon as needed  for shortness of breath, ankle swelling, ABD swelling  If you need a refill on your cardiac medications before your next appointment, please call your pharmacy.   Lab work: No new labs needed  Testing/Procedures: No new testing needed  Follow-Up: At [facility], you and your health needs are our priority.  As part of our continuing mission to provide you with exceptional heart care, we have created designated Provider Care Teams.  These Care Teams include your primary Cardiologist (physician) and Advanced Practice Providers (APPs -  Physician Assistants and Nurse Practitioners) who all work together to provide you with the care you need, when you need it.  You will need a follow up appointment in 6 months  Providers on your designated Care Team:   Nicolasa Ducking, NP Eula Listen, PA-C Cadence Fransico Michael, [place]  COVID-19 Vaccine Information can be found at: [email or web address] For questions related to vaccine distribution or appointments, please email [email or web address] or call [phone].

## 2020-12-09 NOTE — H&P (Signed)
H&P Addendum, pre-cardioversion ° °Patient was seen and evaluated prior to -cardioversion procedure °Symptoms, prior testing details again confirmed with the patient °Patient examined, no significant change from prior exam °Lab work reviewed in detail personally by myself °Patient understands risk and benefit of the procedure,  °The risks (stroke, cardiac arrhythmias rarely resulting in the need for a temporary or permanent pacemaker, skin irritation or burns and complications associated with conscious sedation including aspiration, arrhythmia, respiratory failure and death), benefits (restoration of normal sinus rhythm) and alternatives of a direct current cardioversion were explained in detail °Patient willing to proceed. ° °Signed, °Tim Saahir Prude, MD, Ph.D °[facility]  °

## 2020-12-09 NOTE — Interval H&P Note (Signed)
History and Physical Interval Note:  12/09/2020 8:30 PM  Angela Singh  has presented today for surgery, with the diagnosis of Cardioversion  Afib.  The various methods of treatment have been discussed with the patient and family. After consideration of risks, benefits and other options for treatment, the patient has consented to  Procedure(s): CARDIOVERSION (N/A) as a surgical intervention.  The patient's history has been reviewed, patient examined, no change in status, stable for surgery.  I have reviewed the patient's chart and labs.  Questions were answered to the patient's satisfaction.     Julien Nordmann

## 2020-12-12 ENCOUNTER — Ambulatory Visit: Payer: Medicare HMO | Admitting: Nurse Practitioner

## 2020-12-22 ENCOUNTER — Telehealth: Payer: Self-pay | Admitting: Cardiovascular Disease

## 2020-12-22 MED ORDER — AMIODARONE HCL 200 MG PO TABS: 200.0000 mg | ORAL_TABLET | Freq: Two times a day (BID) | ORAL | 2 refills | Status: DC

## 2020-12-22 MED ORDER — FUROSEMIDE 40 MG PO TABS: 60.0000 mg | ORAL_TABLET | Freq: Every day | ORAL | 2 refills | Status: DC

## 2020-12-22 NOTE — Telephone Encounter (Signed)
*  STAT* If patient is at the pharmacy, call can be transferred to refill team.   1. Which medications need to be refilled? (please list name of each medication and dose if known)  Furosemide 40 MG 1.5 tablet daily (additional as needed) Amiodarone 200 MG 1 tablet 2 times daily   2. Which pharmacy/location (including street and city if local pharmacy) is medication to be sent to? [facility]   3. Do they need a 30 day or 90 day supply? 90 day

## 2020-12-22 NOTE — Telephone Encounter (Signed)
Requested Prescriptions   Signed Prescriptions Disp Refills   amiodarone (PACERONE) 200 MG tablet 180 tablet 2    Sig: Take 1 tablet (200 mg total) by mouth 2 (two) times daily.    Authorizing Provider: Antonieta Iba    Ordering User: Iverson Alamin C   furosemide (LASIX) 40 MG tablet 180 tablet 2    Sig: Take 1.5 tablets (60 mg total) by mouth daily. May take additional 40 mg for swelling or shortness of breath    Authorizing Provider: Antonieta Iba    Ordering User: Kendrick Fries

## 2020-12-25 ENCOUNTER — Other Ambulatory Visit: Payer: Self-pay

## 2020-12-25 ENCOUNTER — Ambulatory Visit: Payer: Medicare HMO | Attending: Family | Admitting: Family

## 2020-12-25 ENCOUNTER — Encounter: Payer: Self-pay | Admitting: Family

## 2020-12-25 ENCOUNTER — Other Ambulatory Visit (INDEPENDENT_AMBULATORY_CARE_PROVIDER_SITE_OTHER): Payer: Medicare HMO

## 2020-12-25 VITALS — BP 121/73 | HR 89 | Resp 16 | Ht 63.0 in | Wt 108.0 lb

## 2020-12-25 DIAGNOSIS — Z7901 Long term (current) use of anticoagulants: Secondary | ICD-10-CM | POA: Diagnosis not present

## 2020-12-25 DIAGNOSIS — R443 Hallucinations, unspecified: Secondary | ICD-10-CM | POA: Diagnosis not present

## 2020-12-25 DIAGNOSIS — I5022 Chronic systolic (congestive) heart failure: Secondary | ICD-10-CM | POA: Diagnosis not present

## 2020-12-25 DIAGNOSIS — I10 Essential (primary) hypertension: Secondary | ICD-10-CM

## 2020-12-25 DIAGNOSIS — R7989 Other specified abnormal findings of blood chemistry: Secondary | ICD-10-CM | POA: Diagnosis not present

## 2020-12-25 DIAGNOSIS — Z7951 Long term (current) use of inhaled steroids: Secondary | ICD-10-CM | POA: Diagnosis not present

## 2020-12-25 DIAGNOSIS — Z79899 Other long term (current) drug therapy: Secondary | ICD-10-CM | POA: Diagnosis not present

## 2020-12-25 DIAGNOSIS — F17210 Nicotine dependence, cigarettes, uncomplicated: Secondary | ICD-10-CM | POA: Diagnosis not present

## 2020-12-25 DIAGNOSIS — I4819 Other persistent atrial fibrillation: Secondary | ICD-10-CM

## 2020-12-25 DIAGNOSIS — I5042 Chronic combined systolic (congestive) and diastolic (congestive) heart failure: Secondary | ICD-10-CM | POA: Insufficient documentation

## 2020-12-25 DIAGNOSIS — E782 Mixed hyperlipidemia: Secondary | ICD-10-CM | POA: Insufficient documentation

## 2020-12-25 DIAGNOSIS — I4891 Unspecified atrial fibrillation: Secondary | ICD-10-CM | POA: Diagnosis not present

## 2020-12-25 DIAGNOSIS — I110 Hypertensive heart disease with heart failure: Secondary | ICD-10-CM | POA: Diagnosis present

## 2020-12-25 DIAGNOSIS — J449 Chronic obstructive pulmonary disease, unspecified: Secondary | ICD-10-CM

## 2020-12-25 NOTE — Progress Notes (Signed)
Patient ID: Angela Singh, female    DOB: 23-Feb-1950, 70 y.o.   MRN: [id number]  HPI  Angela Singh is a 70 y/o female with a history of hyperlipidemia, HTN, COPD, GERD, persistent atrial fibrillation, thrombus in left atria, current tobacco use and chronic heart failure.    Echo report from 10/26/20 reviewed and showed an EF of 45-50% along with mild LVH and mild/moderate MR/TR.   Admitted 10/22-10/24/22 due to acute on chronic HF.  Admitted 10/29/20 due to shortness of breath. Found to be in AF RVR. Cardizem drip started. Cardiology consult obtained. Initially given IV lasix with transition to oral diuretics. Antibiotics and IV solumedrol given for COPD exacerbation. Cardizem drip changed to oral cardizem. TEE with successful cardioversion completed. Discharged after 5 days. In the ED 10/26/20 due to COPD exacerbation where she was treated and released.   She presents today for a follow up with a chief complaint of moderate fatigue upon minimal exertion. She describes this as chronic in nature having been present for several months.She endorses fatigue and generalized malaise. She denies any orthopnea, dizziness, abdominal distention, palpitations, pedal edema, chest pain, shortness of breath, cough or weight gain.   Past Medical History:  Diagnosis Date   Chronic combined systolic (congestive) and diastolic (congestive) heart failure (HCC)    a. 09/2020 Echo: EF 60-65%. No rwma. Nl RV fxn. RVSP 38.71mmHg. Mild to mod MR. Mod TR; b. 10/2020 TEE: EF 45-50%, no rwma, nl RV fxn, RVSP 29.29mmHg, mild to mod MR/TR, Ao sclerosis, small PFO w/ R->L shunting. Mod dil LA.   COPD (chronic obstructive pulmonary disease) (HCC)    Essential hypertension    GERD (gastroesophageal reflux disease)    Mitral regurgitation    Mixed hyperlipidemia    Persistent atrial fibrillation (HCC)    a. Dx 09/2020. CHA2DS2VASc = 3-->Eliquis; b. 10/26/2020 TEE/DCCV: No LA thrombus-->successful DCCV; c. 10/2020 Recurrent Afib-->amio  load-->DCCV-->recurrent afib.   Thrombus of left atrial appendage    a. 09/2020 noted on TEE in setting of Afib-->eliquis; b. 10/2020 TEE: no LA thrombus.   Tobacco abuse    Tricuspid regurgitation    Past Surgical History:  Procedure Laterality Date   CARDIOVERSION N/A 10/26/2020   Procedure: CARDIOVERSION;  Surgeon: Antonieta Iba, MD;  Location: [facility];  Service: Cardiovascular;  Laterality: N/A;   CARDIOVERSION N/A 11/01/2020   Procedure: CARDIOVERSION;  Surgeon: Debbe Odea, MD;  Location: [facility];  Service: Cardiovascular;  Laterality: N/A;   CARDIOVERSION N/A 11/23/2020   Procedure: CARDIOVERSION;  Surgeon: Antonieta Iba, MD;  Location: [facility];  Service: Cardiovascular;  Laterality: N/A;   TEE WITHOUT CARDIOVERSION N/A 09/25/2020   Procedure: TRANSESOPHAGEAL ECHOCARDIOGRAM (TEE);  Surgeon: Iran Ouch, MD;  Location: [facility];  Service: Cardiovascular;  Laterality: N/A;   TEE WITHOUT CARDIOVERSION N/A 10/26/2020   Procedure: TRANSESOPHAGEAL ECHOCARDIOGRAM (TEE);  Surgeon: Antonieta Iba, MD;  Location: [facility];  Service: Cardiovascular;  Laterality: N/A;   Family History  Problem Relation Age of Onset   COPD Mother    Heart failure Father    Heart disease Sister    Social History   Tobacco Use   Smoking status: Every Day    Packs/day: 0.50    Years: 48.00    Pack years: 24.00    Types: Cigarettes   Smokeless tobacco: Never  Substance Use Topics   Alcohol use: Not Currently   Allergies  Allergen Reactions   Codeine Other (See Comments)    weakness  Prior to Admission medications   Medication Sig Start Date End Date Taking? Authorizing Provider  albuterol (VENTOLIN HFA) 108 (90 Base) MCG/ACT inhaler Inhale 2 puffs into the lungs every 4 (four) hours as needed.   Yes [provider]  amiodarone (PACERONE) 200 MG tablet Take 1 tablet (200 mg total) by mouth 2 (two) times daily. 12/22/20  Yes Gollan, Tollie Pizza, MD  apixaban (ELIQUIS) 5  MG TABS tablet Take 1 tablet (5 mg total) by mouth 2 (two) times daily. 12/04/20 03/04/21 Yes Creig Hines, NP  ascorbic acid (VITAMIN C) 500 MG tablet Take 1,000 mg by mouth 2 (two) times daily.   Yes [provider]  budesonide-formoterol (SYMBICORT) 160-4.5 MCG/ACT inhaler Inhale 1 puff into the lungs 2 (two) times daily in the am and at bedtime.. 08/08/20  Yes [provider]  diltiazem (CARDIZEM CD) 180 MG 24 hr capsule Take 1 capsule (180 mg total) by mouth daily. 12/05/20  Yes Antonieta Iba, MD  FLUoxetine (PROZAC) 40 MG capsule Take 40 mg by mouth daily.   Yes [provider]  furosemide (LASIX) 40 MG tablet Take 1.5 tablets (60 mg total) by mouth daily. May take additional 40 mg for swelling or shortness of breath 12/22/20  Yes Gollan, Tollie Pizza, MD  metoprolol tartrate (LOPRESSOR) 100 MG tablet Take 1 tablet (100 mg total) by mouth 2 (two) times daily. 11/27/20 11/27/21 Yes Wouk, Wilfred Curtis, MD  Multiple Vitamins-Minerals (ONE-A-DAY WOMENS 50+ PO) Take 1 tablet by mouth daily.   Yes [provider]  pantoprazole (PROTONIX) 40 MG tablet Take 40 mg by mouth daily. 09/11/20  Yes [provider]  potassium chloride SA (KLOR-CON) 20 MEQ tablet Take 1.5 tablets (30 mEq total) by mouth daily. 11/21/20  Yes Creig Hines, NP  vitamin E 180 MG (400 UNITS) capsule Take 400 Units by mouth 2 (two) times daily.   Yes [provider]  nicotine (NICODERM CQ - DOSED IN MG/24 HOURS) 21 mg/24hr patch Place 1 patch (21 mg total) onto the skin daily. Patient not taking: Reported on 12/05/2020 11/03/20   Darlin Priestly, MD    Review of Systems  Constitutional:  Positive for fatigue (easily). Negative for appetite change.  HENT:         Tongue burns with eating   Eyes: Negative.   Cardiovascular:  Negative for palpitations.  Gastrointestinal:  Negative for abdominal distention.  Endocrine: Negative.   Genitourinary: Negative.    Musculoskeletal:  Negative for back pain.  Skin: Negative.   Allergic/Immunologic: Negative.   Neurological:  Negative for dizziness and light-headedness.  Hematological:  Negative for adenopathy. Does not bruise/bleed easily.  Psychiatric/Behavioral:  Positive for dysphoric mood (affect flat) and hallucinations. Negative for sleep disturbance (sleeping on 1 pillow). The patient is not nervous/anxious.    Vitals:   12/25/20 1155  BP: 121/73  Pulse: 89  Resp: 16  SpO2: 95%  Weight: 108 lb (49 kg)  Height: 5\' 3"  (1.6 m)   Wt Readings from Last 3 Encounters:  12/25/20 108 lb (49 kg)  12/05/20 110 lb (49.9 kg)  11/26/20 109 lb 14.4 oz (49.9 kg)   Lab Results  Component Value Date   CREATININE 0.90 11/30/2020   CREATININE 0.87 11/27/2020   CREATININE 0.94 11/26/2020   Physical Exam Vitals and nursing note reviewed.  Constitutional:      General: She is not in acute distress.    Appearance: Normal appearance.  HENT:     Head: Normocephalic  and atraumatic.  Neck:     Vascular: No JVD.  Cardiovascular:     Rate and Rhythm: Normal rate. Rhythm irregular.  Pulmonary:     Effort: Pulmonary effort is normal. No respiratory distress.     Breath sounds: No wheezing or rales.  Abdominal:     General: Abdomen is flat. There is no distension.     Palpations: Abdomen is soft.  Musculoskeletal:        General: No tenderness.     Cervical back: Normal range of motion and neck supple.     Right lower leg: No edema.     Left lower leg: No edema.  Skin:    General: Skin is warm and dry.  Neurological:     General: No focal deficit present.     Mental Status: She is alert and oriented to person, place, and time.  Psychiatric:        Mood and Affect: Mood normal.        Behavior: Behavior normal.        Thought Content: Thought content normal.    Assessment & Plan:  1: Chronic heart failure with mildly reduced ejection fraction- - NYHA class III - euvolemic today - weighing  daily; instructed to call for an overnight weight gain of > 2 pounds or a weekly weight gain of > 5 pounds - weight up 3 pounds from last visit here 6 weeks ago - not adding salt and has been reading food labels for sodium content; reviewed the importance of trying to keep daily sodium intake to < 2000mg  - on GDMT of metoprolol (although tartrate) - BNP 11/27/20 was 826.7 - reports getting her flu vaccine for this season  2: HTN- - BP looks good 121/73 - sees PCP (Bender) - BMP 11/30/20 reviewed and showed sodium 141, potassium 4.3, creatinine 0.9 and GFR >60  3: Atrial fibrillation- - saw cardiology 12/02/20) 12/05/20 - previously been cardioverted but still in afib - currently taking metoprolol tartrate, diltiazem and amiodarone - referred to EP, will see on 12/27/20  4: COPD- - smoking 1/2 ppd of cigarettes  - has quit in the past  - complete cessation discussed for 3 minutes with her  5: Hallucinations- - reports they have started "recently" and are bothersome - audio and visual that wake her up at night - she is aware they are hallucinations  - denies SI/HI - adherent to prozac - attempted to illicit start, she feels they started after her recent hospitalization but she is not entirely sure - states this "happened before and they adjusted my meds" she cannot elaborate on when it happened or what the adjustments were   Patient did not bring her medications nor a list. Each medication was verbally reviewed with the patient and she was encouraged to bring the bottles to every visit to confirm accuracy of list.   Return in 6 weeks or sooner for any questions/problems before then.

## 2020-12-25 NOTE — Patient Instructions (Signed)
Report your symptoms of visual and audio hallucinations to your EP MD on Wednesday   Return to HF clinic in six weeks

## 2020-12-26 LAB — T4, FREE: Free T4: 1.29 ng/dL (ref 0.82–1.77)

## 2020-12-26 LAB — TSH: TSH: 8.95 u[IU]/mL — ABNORMAL HIGH (ref 0.450–4.500)

## 2020-12-27 ENCOUNTER — Ambulatory Visit (INDEPENDENT_AMBULATORY_CARE_PROVIDER_SITE_OTHER): Payer: Medicare HMO | Admitting: Cardiology

## 2020-12-27 ENCOUNTER — Other Ambulatory Visit: Payer: Self-pay

## 2020-12-27 ENCOUNTER — Encounter: Payer: Self-pay | Admitting: Cardiology

## 2020-12-27 ENCOUNTER — Ambulatory Visit (INDEPENDENT_AMBULATORY_CARE_PROVIDER_SITE_OTHER): Payer: Medicare HMO

## 2020-12-27 VITALS — BP 116/64 | HR 75 | Ht 63.0 in | Wt 110.2 lb

## 2020-12-27 DIAGNOSIS — I5032 Chronic diastolic (congestive) heart failure: Secondary | ICD-10-CM | POA: Diagnosis not present

## 2020-12-27 DIAGNOSIS — I4821 Permanent atrial fibrillation: Secondary | ICD-10-CM | POA: Diagnosis not present

## 2020-12-27 DIAGNOSIS — I10 Essential (primary) hypertension: Secondary | ICD-10-CM

## 2020-12-27 MED ORDER — AMIODARONE HCL 200 MG PO TABS: 200.0000 mg | ORAL_TABLET | Freq: Every day | ORAL | 3 refills | Status: DC

## 2020-12-27 NOTE — Progress Notes (Signed)
Electrophysiology Office Note:    Date:  12/27/2020   ID:  Angela Singh, DOB 1950/07/20, MRN [id number]  PCP:  Oswaldo Conroy, MD  [facility] Cardiologist:  None  [facility] Electrophysiologist:  Lanier Prude, MD   Referring MD: Antonieta Iba, MD   Chief Complaint: Atrial fibrillation  History of Present Illness:    Angela Singh is a 70 y.o. female who presents for an evaluation of atrial fibrillation at the request of Dr. Mariah Milling. Their medical history includes COPD, tobacco abuse, hypertension, hyperlipidemia, GERD and persistent atrial fibrillation.  She has mildly reduced left ventricular function measured in September of this year.  She has been hospitalized multiple times for atrial fibrillation with RVR causing heart failure symptoms.  She has been treated with diltiazem, amiodarone, and digoxin.  Transesophageal echo on August 22 suggested left atrial appendage thrombus.  She was taken off of digoxin because of a toxic level causing slow ventricular responses.  Today she tells me she is tired all the time but is hard to tease out whether or not this is related to her breathing difficulties or her atrial fibrillation.     Past Medical History:  Diagnosis Date   Chronic combined systolic (congestive) and diastolic (congestive) heart failure (HCC)    a. 09/2020 Echo: EF 60-65%. No rwma. Nl RV fxn. RVSP 38.21mmHg. Mild to mod MR. Mod TR; b. 10/2020 TEE: EF 45-50%, no rwma, nl RV fxn, RVSP 29.26mmHg, mild to mod MR/TR, Ao sclerosis, small PFO w/ R->L shunting. Mod dil LA.   COPD (chronic obstructive pulmonary disease) (HCC)    Essential hypertension    GERD (gastroesophageal reflux disease)    Mitral regurgitation    Mixed hyperlipidemia    Persistent atrial fibrillation (HCC)    a. Dx 09/2020. CHA2DS2VASc = 3-->Eliquis; b. 10/26/2020 TEE/DCCV: No LA thrombus-->successful DCCV; c. 10/2020 Recurrent Afib-->amio load-->DCCV-->recurrent afib.   Thrombus of left  atrial appendage    a. 09/2020 noted on TEE in setting of Afib-->eliquis; b. 10/2020 TEE: no LA thrombus.   Tobacco abuse    Tricuspid regurgitation     Past Surgical History:  Procedure Laterality Date   CARDIOVERSION N/A 10/26/2020   Procedure: CARDIOVERSION;  Surgeon: Antonieta Iba, MD;  Location: [facility];  Service: Cardiovascular;  Laterality: N/A;   CARDIOVERSION N/A 11/01/2020   Procedure: CARDIOVERSION;  Surgeon: Debbe Odea, MD;  Location: [facility];  Service: Cardiovascular;  Laterality: N/A;   CARDIOVERSION N/A 11/23/2020   Procedure: CARDIOVERSION;  Surgeon: Antonieta Iba, MD;  Location: [facility];  Service: Cardiovascular;  Laterality: N/A;   TEE WITHOUT CARDIOVERSION N/A 09/25/2020   Procedure: TRANSESOPHAGEAL ECHOCARDIOGRAM (TEE);  Surgeon: Iran Ouch, MD;  Location: [facility];  Service: Cardiovascular;  Laterality: N/A;   TEE WITHOUT CARDIOVERSION N/A 10/26/2020   Procedure: TRANSESOPHAGEAL ECHOCARDIOGRAM (TEE);  Surgeon: Antonieta Iba, MD;  Location: [facility];  Service: Cardiovascular;  Laterality: N/A;    Current Medications: Current Meds  Medication Sig   albuterol (VENTOLIN HFA) 108 (90 Base) MCG/ACT inhaler Inhale 2 puffs into the lungs every 4 (four) hours as needed.   amiodarone (PACERONE) 200 MG tablet Take 1 tablet (200 mg total) by mouth daily.   apixaban (ELIQUIS) 5 MG TABS tablet Take 1 tablet (5 mg total) by mouth 2 (two) times daily.   ascorbic acid (VITAMIN C) 500 MG tablet Take 1,000 mg by mouth 2 (two) times daily.   budesonide-formoterol (SYMBICORT) 160-4.5 MCG/ACT inhaler Inhale 1 puff  into the lungs 2 (two) times daily in the am and at bedtime.Marland Kitchen   diltiazem (CARDIZEM CD) 180 MG 24 hr capsule Take 1 capsule (180 mg total) by mouth daily.   FLUoxetine (PROZAC) 40 MG capsule Take 40 mg by mouth daily.   furosemide (LASIX) 40 MG tablet Take 1.5 tablets (60 mg total) by mouth daily. May take additional 40 mg for swelling or shortness of  breath   metoprolol tartrate (LOPRESSOR) 100 MG tablet Take 1 tablet (100 mg total) by mouth 2 (two) times daily.   Multiple Vitamins-Minerals (ONE-A-DAY WOMENS 50+ PO) Take 1 tablet by mouth daily.   pantoprazole (PROTONIX) 40 MG tablet Take 40 mg by mouth daily.   potassium chloride SA (KLOR-CON) 20 MEQ tablet Take 1.5 tablets (30 mEq total) by mouth daily.   vitamin E 180 MG (400 UNITS) capsule Take 400 Units by mouth 2 (two) times daily.   [DISCONTINUED] amiodarone (PACERONE) 200 MG tablet Take 1 tablet (200 mg total) by mouth 2 (two) times daily.     Allergies:   Codeine   Social History   Socioeconomic History   Marital status: Divorced    Spouse name: Not on file   Number of children: Not on file   Years of education: Not on file   Highest education level: Not on file  Occupational History   Not on file  Tobacco Use   Smoking status: Every Day    Packs/day: 0.50    Years: 48.00    Pack years: 24.00    Types: Cigarettes   Smokeless tobacco: Never  Vaping Use   Vaping Use: Never used  Substance and Sexual Activity   Alcohol use: Not Currently   Drug use: Never   Sexual activity: Not on file  Other Topics Concern   Not on file  Social History Narrative   Lives locally.  Does not routinely exercise.   Social Determinants of Health   Financial Resource Strain: Not on file  Food Insecurity: Not on file  Transportation Needs: Not on file  Physical Activity: Not on file  Stress: Not on file  Social Connections: Not on file     Family History: The patient's family history includes COPD in her mother; Heart disease in her sister; Heart failure in her father.  ROS:   Please see the history of present illness.    All other systems reviewed and are negative.  EKGs/Labs/Other Studies Reviewed:    The following studies were reviewed today:  November 25, 2020 echo Left ventricular function normal, 60% Right ventricular function normal Moderately dilated left  atrium Moderate MR Moderate TR Small pericardial effusion  October 26, 2020 transesophageal echo Left ventricular function mildly reduced, 45 to 50% Right ventricular function normal Mild to moderate MR Mild to moderate TR PFO Moderately dilated left atrium Moderately dilated right atrium Aortic atherosclerosis No left atrial appendage thrombus  September 25, 2020 transesophageal echo Small left atrial appendage thrombus detected  EKG:  The ekg ordered today demonstrates atrial fibrillation with a ventricular rate of 75 bpm.   Recent Labs: 11/16/2020: ALT 125 11/27/2020: B Natriuretic Peptide 826.7; Hemoglobin 13.2; Magnesium 2.1; Platelets 193 11/30/2020: BUN 16; Creatinine, Ser 0.90; Potassium 4.3; Sodium 141 12/25/2020: TSH 8.950  Recent Lipid Panel    Component Value Date/Time   CHOL 161 10/13/2020 0519   TRIG 68 10/13/2020 0519   HDL 54 10/13/2020 0519   CHOLHDL 3.0 10/13/2020 0519   VLDL 14 10/13/2020 0519   LDLCALC 93  10/13/2020 0519    Physical Exam:    VS:  BP 116/64   Pulse 75   Ht 5\' 3"  (1.6 m)   Wt 110 lb 3.2 oz (50 kg)   SpO2 90%   BMI 19.52 kg/m     Wt Readings from Last 3 Encounters:  12/27/20 110 lb 3.2 oz (50 kg)  12/25/20 108 lb (49 kg)  12/05/20 110 lb (49.9 kg)     GEN: Thin.  Chronically ill-appearing.  No acute distress. HEENT: Normal NECK: No JVD; No carotid bruits LYMPHATICS: No lymphadenopathy CARDIAC: Irregularly irregular, no murmurs, rubs, gallops RESPIRATORY:  Clear to auscultation without rales, wheezing or rhonchi  ABDOMEN: Soft, non-tender, non-distended MUSCULOSKELETAL:  No edema; No deformity  SKIN: Warm and dry NEUROLOGIC:  Alert and oriented x 3 PSYCHIATRIC:  Normal affect       ASSESSMENT:    1. Permanent atrial fibrillation (HCC)   2. Essential hypertension   3. Chronic diastolic CHF (congestive heart failure) (HCC)    PLAN:    In order of problems listed above:  #Permanent atrial fibrillation On  amiodarone for rate control.  We will decrease the dose to 200 mg by mouth once a day.  There is been some change in her thyroid function that would need to be monitored closely.  She may require thyroid supplementation if it does not improve on repeat checks.  She is not an ablation candidate because of her poor respiratory status  I would like to start with a 2-week ZIO monitor to assess her rate control.  If she is poorly rate controlled and that seems to correspond to her symptoms, could consider an ablation of the AV node with a permanent pacemaker implant.  We discussed this briefly during today's visit.  I will plan to see her back in 6 to 8 weeks to review her ZIO monitor results and to determine treatment strategy.  We will also repeat her blood work including a TSH and free T4 at that appointment.  #COPD and hypoxia Unclear if this is the primary driver of her symptoms or her atrial fibrillation Her resting oxygen level is 90% today.  Primary care follow-up  #Chronic diastolic heart failure Also likely contributing to her shortness of breath and fatigue.  Sees Dr. 13/01/22 for this.   Follow-up with me in 6 to 8 weeks   Medication Adjustments/Labs and Tests Ordered: Current medicines are reviewed at length with the patient today.  Concerns regarding medicines are outlined above.  Orders Placed This Encounter  Procedures   LONG TERM MONITOR (3-14 DAYS)   EKG 12-Lead   Meds ordered this encounter  Medications   amiodarone (PACERONE) 200 MG tablet    Sig: Take 1 tablet (200 mg total) by mouth daily.    Dispense:  90 tablet    Refill:  3     Signed, Mariah Milling T. Sheria Lang, MD, [facility], [facility] 12/27/2020 11:44 AM    Electrophysiology [facility]

## 2020-12-27 NOTE — Patient Instructions (Addendum)
Medication Instructions:  Your physician has recommended you make the following change in your medication:    REDUCE your amiodarone 200 mg-  Take one tablet by mouth daily   Lab Work: None ordered. If you have labs (blood work) drawn today and your tests are completely normal, you will receive your results only by: MyChart Message (if you have MyChart) OR A paper copy in the mail If you have any lab test that is abnormal or we need to change your treatment, we will call you to review the results.  Testing/Procedures: Your physician has recommended that you wear a holter monitor. Holter monitors are medical devices that record the heart's electrical activity. Doctors most often use these monitors to diagnose arrhythmias. Arrhythmias are problems with the speed or rhythm of the heartbeat. The monitor is a small, portable device. You can wear one while you do your normal daily activities. This is usually used to diagnose what is causing palpitations/syncope (passing out).  You will wear a 14 day ZIO monitor  Follow-Up: At [facility], you and your health needs are our priority.  As part of our continuing mission to provide you with exceptional heart care, we have created designated Provider Care Teams.  These Care Teams include your primary Cardiologist (physician) and Advanced Practice Providers (APPs -  Physician Assistants and Nurse Practitioners) who all work together to provide you with the care you need, when you need it.  Your next appointment:   Your physician wants you to follow-up in: 6-8 weeks with Dr. Lalla Brothers   Your physician has recommended that you wear a Zio monitor.   This monitor is a medical device that records the heart's electrical activity. Doctors most often use these monitors to diagnose arrhythmias. Arrhythmias are problems with the speed or rhythm of the heartbeat. The monitor is a small device applied to your chest. You can wear one while you do your normal daily  activities. While wearing this monitor if you have any symptoms to push the button and record what you felt. Once you have worn this monitor for the period of time provider prescribed (Usually 14 days), you will return the monitor device in the postage paid box. Once it is returned they will download the data collected and provide [place] with a report which the provider will then review and we will call you with those results. Important tips:  Avoid showering during the first 24 hours of wearing the monitor. Avoid excessive sweating to help maximize wear time. Do not submerge the device, no hot tubs, and no swimming pools. Keep any lotions or oils away from the patch. After 24 hours you may shower with the patch on. Take brief showers with your back facing the shower head.  Do not remove patch once it has been placed because that will interrupt data and decrease adhesive wear time. Push the button when you have any symptoms and write down what you were feeling. Once you have completed wearing your monitor, remove and place into box which has postage paid and place in your outgoing mailbox.  If for some reason you have misplaced your box then call our office and we can provide another box and/or mail it off for you.

## 2021-01-04 DIAGNOSIS — I4821 Permanent atrial fibrillation: Secondary | ICD-10-CM | POA: Diagnosis not present

## 2021-01-31 ENCOUNTER — Telehealth: Payer: Self-pay | Admitting: Cardiovascular Disease

## 2021-01-31 MED ORDER — POTASSIUM CHLORIDE CRYS ER 20 MEQ PO TBCR: 30.0000 meq | EXTENDED_RELEASE_TABLET | Freq: Every day | ORAL | 0 refills | Status: DC

## 2021-01-31 NOTE — Telephone Encounter (Signed)
°*  STAT* If patient is at the pharmacy, call can be transferred to refill team.   1. Which medications need to be refilled? (please list name of each medication and dose if known) potassium chloride 20 MEQ 1.5 tablets daily   2. Which pharmacy/location (including street and city if local pharmacy) is medication to be sent to?[facility]   3. Do they need a 30 day or 90 day supply? 90 day

## 2021-01-31 NOTE — Telephone Encounter (Signed)
Requested Prescriptions   Signed Prescriptions Disp Refills   potassium chloride SA (KLOR-CON M) 20 MEQ tablet 135 tablet 0    Sig: Take 1.5 tablets (30 mEq total) by mouth daily.    Authorizing Provider: Antonieta Iba    Ordering User: Thayer Headings, Devian Bartolomei L

## 2021-02-06 NOTE — Progress Notes (Deleted)
Patient ID: Angela Singh, female    DOB: [birth year], 71 y.o.   MRN: [id number]  HPI  [name] is a 71 y/o female with a history of hyperlipidemia, HTN, COPD, GERD, persistent atrial fibrillation, thrombus in left atria, current tobacco use and chronic heart failure.    Echo report from 11/25/20 reviewed and showed an EF of 60-65% along with moderate LAE and moderate MR/ TR. Echo report from 10/26/20 reviewed and showed an EF of 45-50% along with mild LVH and mild/moderate MR/TR.   Admitted 10/22-10/24/22 due to acute on chronic HF. Found to be in AF/RVR. Treated with rate control agents. Discharged after 2 days.  Admitted 10/29/20 due to shortness of breath. Found to be in AF RVR. Cardizem drip started. Cardiology consult obtained. Initially given IV lasix with transition to oral diuretics. Antibiotics and IV solumedrol given for COPD exacerbation. Cardizem drip changed to oral cardizem. TEE with successful cardioversion completed. Discharged after 5 days. In the ED 10/26/20 due to COPD exacerbation where she was treated and released.   She presents today for a follow up with a chief complaint of   Past Medical History:  Diagnosis Date   Chronic combined systolic (congestive) and diastolic (congestive) heart failure (HCC)    a. 09/2020 Echo: EF 60-65%. No rwma. Nl RV fxn. RVSP 38.552mmHg. Mild to mod MR. Mod TR; b. 10/2020 TEE: EF 45-50%, no rwma, nl RV fxn, RVSP 29.282mmHg, mild to mod MR/TR, Ao sclerosis, small PFO w/ R->L shunting. Mod dil LA.   COPD (chronic obstructive pulmonary disease) (HCC)    Essential hypertension    GERD (gastroesophageal reflux disease)    Mitral regurgitation    Mixed hyperlipidemia    Persistent atrial fibrillation (HCC)    a. Dx 09/2020. CHA2DS2VASc = 3-->Eliquis; b. 10/26/2020 TEE/DCCV: No LA thrombus-->successful DCCV; c. 10/2020 Recurrent Afib-->amio load-->DCCV-->recurrent afib.   Thrombus of left atrial appendage    a. 09/2020 noted on TEE in setting of  Afib-->eliquis; b. 10/2020 TEE: no LA thrombus.   Tobacco abuse    Tricuspid regurgitation    Past Surgical History:  Procedure Laterality Date   CARDIOVERSION N/A 10/26/2020   Procedure: CARDIOVERSION;  Surgeon: Antonieta IbaGollan, Timothy J, MD;  Location: [facility];  Service: Cardiovascular;  Laterality: N/A;   CARDIOVERSION N/A 11/01/2020   Procedure: CARDIOVERSION;  Surgeon: Debbe OdeaAgbor-Etang, Brian, MD;  Location: [facility];  Service: Cardiovascular;  Laterality: N/A;   CARDIOVERSION N/A 11/23/2020   Procedure: CARDIOVERSION;  Surgeon: Antonieta IbaGollan, Timothy J, MD;  Location: [facility];  Service: Cardiovascular;  Laterality: N/A;   TEE WITHOUT CARDIOVERSION N/A 09/25/2020   Procedure: TRANSESOPHAGEAL ECHOCARDIOGRAM (TEE);  Surgeon: Iran OuchArida, Muhammad A, MD;  Location: [facility];  Service: Cardiovascular;  Laterality: N/A;   TEE WITHOUT CARDIOVERSION N/A 10/26/2020   Procedure: TRANSESOPHAGEAL ECHOCARDIOGRAM (TEE);  Surgeon: Antonieta IbaGollan, Timothy J, MD;  Location: [facility];  Service: Cardiovascular;  Laterality: N/A;   Family History  Problem Relation Age of Onset   COPD Mother    Heart failure Father    Heart disease Sister    Social History   Tobacco Use   Smoking status: Every Day    Packs/day: 0.50    Years: 48.00    Pack years: 24.00    Types: Cigarettes   Smokeless tobacco: Never  Substance Use Topics   Alcohol use: Not Currently   Allergies  Allergen Reactions   Codeine Other (See Comments)    weakness     Review of Systems  Constitutional:  Positive for fatigue (easily). Negative for appetite change.  HENT:         Tongue burns with eating   Eyes: Negative.   Cardiovascular:  Negative for palpitations.  Gastrointestinal:  Negative for abdominal distention.  Endocrine: Negative.   Genitourinary: Negative.   Musculoskeletal:  Negative for back pain.  Skin: Negative.   Allergic/Immunologic: Negative.   Neurological:  Negative for dizziness and light-headedness.  Hematological:  Negative for  adenopathy. Does not bruise/bleed easily.  Psychiatric/Behavioral:  Positive for dysphoric mood (affect flat) and hallucinations. Negative for sleep disturbance (sleeping on 1 pillow). The patient is not nervous/anxious.      Physical Exam Vitals and nursing note reviewed.  Constitutional:      General: She is not in acute distress.    Appearance: Normal appearance.  HENT:     Head: Normocephalic and atraumatic.  Neck:     Vascular: No JVD.  Cardiovascular:     Rate and Rhythm: Normal rate. Rhythm irregular.  Pulmonary:     Effort: Pulmonary effort is normal. No respiratory distress.     Breath sounds: No wheezing or rales.  Abdominal:     General: Abdomen is flat. There is no distension.     Palpations: Abdomen is soft.  Musculoskeletal:        General: No tenderness.     Cervical back: Normal range of motion and neck supple.     Right lower leg: No edema.     Left lower leg: No edema.  Skin:    General: Skin is warm and dry.  Neurological:     General: No focal deficit present.     Mental Status: She is alert and oriented to person, place, and time.  Psychiatric:        Mood and Affect: Mood normal.        Behavior: Behavior normal.        Thought Content: Thought content normal.    Assessment & Plan:  1: Chronic heart failure with preserved ejection fraction with structural changes (LAE)- - NYHA class III - euvolemic today - weighing daily; instructed to call for an overnight weight gain of > 2 pounds or a weekly weight gain of > 5 pounds - weight 108 pounds from last visit here 6 weeks ago - not adding salt and has been reading food labels for sodium content; reviewed the importance of trying to keep daily sodium intake to < 2000mg  - on GDMT of metoprolol (although tartrate) - BNP 11/27/20 was 826.7 - reports getting her flu vaccine for this season  2: HTN- - BP  - sees PCP (Bender) - BMP 11/30/20 reviewed and showed sodium 141, potassium 4.3, creatinine 0.9  and GFR >60  3: Atrial fibrillation- - saw cardiology 12/02/20) 12/05/20 - previously been cardioverted but still in afib - currently taking metoprolol tartrate, diltiazem and amiodarone - saw EP (Lambert)12/27/20  4: COPD- - smoking 1/2 ppd of cigarettes  - has quit in the past  - complete cessation discussed for 3 minutes with her  5: Hallucinations- - reports they have started "recently" and are bothersome - audio and visual that wake her up at night - she is aware they are hallucinations  - denies SI/HI - adherent to prozac - attempted to illicit start, she feels they started after her recent hospitalization but she is not entirely sure - states this "happened before and they adjusted my meds" she cannot elaborate on when it happened or what the adjustments were  Patient did not bring her medications nor a list. Each medication was verbally reviewed with the patient and she was encouraged to bring the bottles to every visit to confirm accuracy of list.

## 2021-02-07 ENCOUNTER — Ambulatory Visit: Payer: Medicare HMO | Admitting: Family

## 2021-02-13 ENCOUNTER — Telehealth: Payer: Self-pay | Admitting: Cardiovascular Disease

## 2021-02-13 ENCOUNTER — Other Ambulatory Visit: Payer: Self-pay

## 2021-02-13 MED ORDER — DILTIAZEM HCL ER COATED BEADS 180 MG PO CP24: 180.0000 mg | ORAL_CAPSULE | Freq: Every day | ORAL | 3 refills | Status: DC

## 2021-02-13 NOTE — Telephone Encounter (Signed)
°*  STAT* If patient is at the pharmacy, call can be transferred to refill team.   1. Which medications need to be refilled? (please list name of each medication and dose if known) Diltiazem   2. Which pharmacy/location (including street and city if local pharmacy) is medication to be sent to?[facility]   3. Do they need a 30 day or 90 day supply? [place]

## 2021-02-13 NOTE — Telephone Encounter (Signed)
Medication has been sent.  

## 2021-02-20 NOTE — Progress Notes (Signed)
Electrophysiology Office Follow up Visit Note:    Date:  02/21/2021   ID:  Angela Singh, DOB 1950-05-19, MRN [id number]  PCP:  Letta Median, MD  [facility] Cardiologist:  None  [facility] Electrophysiologist:  Vickie Epley, MD    Interval History:    Angela Singh is a 71 y.o. female who presents for a follow up visit. They were last seen in clinic December 27, 2020 for permanent atrial fibrillation.  She is not a candidate for catheter ablation because of a poor respiratory status.  She is on amiodarone for rate control.  At her last appointment we discussed permanent pacemaker implant with AV junction ablation.  A monitor was recommended at the last appointment to assess her rate control. She has been hospitalized multiple times with decompensated heart failure thought to be secondary to atrial fibrillation with rapid ventricular rates  Today she reports continued trouble breathing with exertion.  She tells me she feels her heart racing when she exerts herself.  She continues take Eliquis for stroke prophylaxis.  She is on amiodarone 200 mg by mouth once daily.    Past Medical History:  Diagnosis Date   Chronic combined systolic (congestive) and diastolic (congestive) heart failure ([place])    a. 09/2020 Echo: EF 60-65%. No rwma. Nl RV fxn. RVSP 38.76mmHg. Mild to mod MR. Mod TR; b. 10/2020 TEE: EF 45-50%, no rwma, nl RV fxn, RVSP 29.6mmHg, mild to mod MR/TR, Ao sclerosis, small PFO w/ R->L shunting. Mod dil LA.   COPD (chronic obstructive pulmonary disease) (HCC)    Essential hypertension    GERD (gastroesophageal reflux disease)    Mitral regurgitation    Mixed hyperlipidemia    Persistent atrial fibrillation (HCC)    a. Dx 09/2020. CHA2DS2VASc = 3-->Eliquis; b. 10/26/2020 TEE/DCCV: No LA thrombus-->successful DCCV; c. 10/2020 Recurrent Afib-->amio load-->DCCV-->recurrent afib.   Thrombus of left atrial appendage    a. 09/2020 noted on TEE in setting of  Afib-->eliquis; b. 10/2020 TEE: no LA thrombus.   Tobacco abuse    Tricuspid regurgitation     Past Surgical History:  Procedure Laterality Date   CARDIOVERSION N/A 10/26/2020   Procedure: CARDIOVERSION;  Surgeon: Minna Merritts, MD;  Location: [place] [facility];  Service: Cardiovascular;  Laterality: N/A;   CARDIOVERSION N/A 11/01/2020   Procedure: CARDIOVERSION;  Surgeon: Kate Sable, MD;  Location: [facility];  Service: Cardiovascular;  Laterality: N/A;   CARDIOVERSION N/A 11/23/2020   Procedure: CARDIOVERSION;  Surgeon: Minna Merritts, MD;  Location: [facility];  Service: Cardiovascular;  Laterality: N/A;   TEE WITHOUT CARDIOVERSION N/A 09/25/2020   Procedure: TRANSESOPHAGEAL ECHOCARDIOGRAM (TEE);  Surgeon: Wellington Hampshire, MD;  Location: [facility];  Service: Cardiovascular;  Laterality: N/A;   TEE WITHOUT CARDIOVERSION N/A 10/26/2020   Procedure: TRANSESOPHAGEAL ECHOCARDIOGRAM (TEE);  Surgeon: Minna Merritts, MD;  Location: [facility];  Service: Cardiovascular;  Laterality: N/A;    Current Medications: Current Meds  Medication Sig   albuterol (VENTOLIN HFA) 108 (90 Base) MCG/ACT inhaler Inhale 2 puffs into the lungs every 4 (four) hours as needed.   amiodarone (PACERONE) 200 MG tablet Take 1 tablet (200 mg total) by mouth daily.   apixaban (ELIQUIS) 5 MG TABS tablet Take 1 tablet (5 mg total) by mouth 2 (two) times daily.   ascorbic acid (VITAMIN C) 500 MG tablet Take 1,000 mg by mouth 2 (two) times daily.   budesonide-formoterol (SYMBICORT) 160-4.5 MCG/ACT inhaler Inhale 1 puff into the lungs 2 (two)  times daily in the am and at bedtime.Marland Kitchen   diltiazem (CARDIZEM CD) 180 MG 24 hr capsule Take 1 capsule (180 mg total) by mouth daily.   FLUoxetine (PROZAC) 40 MG capsule Take 40 mg by mouth daily.   furosemide (LASIX) 40 MG tablet Take 1.5 tablets (60 mg total) by mouth daily. May take additional 40 mg for swelling or shortness of breath   metoprolol tartrate (LOPRESSOR) 100 MG tablet Take  1 tablet (100 mg total) by mouth 2 (two) times daily.   Multiple Vitamins-Minerals (ONE-A-DAY WOMENS 50+ PO) Take 1 tablet by mouth daily.   pantoprazole (PROTONIX) 40 MG tablet Take 40 mg by mouth daily.   potassium chloride SA (KLOR-CON M) 20 MEQ tablet Take 1.5 tablets (30 mEq total) by mouth daily.   vitamin E 180 MG (400 UNITS) capsule Take 400 Units by mouth 2 (two) times daily.     Allergies:   Codeine   Social History   Socioeconomic History   Marital status: Divorced    Spouse name: Not on file   Number of children: Not on file   Years of education: Not on file   Highest education level: Not on file  Occupational History   Not on file  Tobacco Use   Smoking status: Every Day    Packs/day: 0.50    Years: 48.00    Pack years: 24.00    Types: Cigarettes   Smokeless tobacco: Never  Vaping Use   Vaping Use: Never used  Substance and Sexual Activity   Alcohol use: Not Currently   Drug use: Never   Sexual activity: Not on file  Other Topics Concern   Not on file  Social History Narrative   Lives locally.  Does not routinely exercise.   Social Determinants of Health   Financial Resource Strain: Not on file  Food Insecurity: Not on file  Transportation Needs: Not on file  Physical Activity: Not on file  Stress: Not on file  Social Connections: Not on file     Family History: The patient's family history includes COPD in her mother; Heart disease in her sister; Heart failure in her father.  ROS:   Please see the history of present illness.    All other systems reviewed and are negative.  EKGs/Labs/Other Studies Reviewed:    The following studies were reviewed today:  January 30, 2021 ZIO monitor Heart rates 59-126, average 96 bpm 100% atrial fibrillation  November 25, 2020 echo Left ventricular function normal, 60% Right ventricular function normal Moderate MR Moderate TR   EKG:  The ekg ordered today demonstrates atrial fibrillation with a  ventricular to 91 bpm.  Recent Labs: 11/16/2020: ALT 125 11/27/2020: B Natriuretic Peptide 826.7; Hemoglobin 13.2; Magnesium 2.1; Platelets 193 11/30/2020: BUN 16; Creatinine, Ser 0.90; Potassium 4.3; Sodium 141 12/25/2020: TSH 8.950  Recent Lipid Panel    Component Value Date/Time   CHOL 161 10/13/2020 0519   TRIG 68 10/13/2020 0519   HDL 54 10/13/2020 0519   CHOLHDL 3.0 10/13/2020 0519   VLDL 14 10/13/2020 0519   LDLCALC 93 10/13/2020 0519    Physical Exam:    VS:  BP 120/80 (BP Location: Left Arm, Patient Position: Sitting, Cuff Size: Normal)    Pulse 91    Ht 5\' 3"  (1.6 m)    Wt 109 lb (49.4 kg)    SpO2 94%    BMI 19.31 kg/m     Wt Readings from Last 3 Encounters:  02/21/21 109 lb (49.4  kg)  12/27/20 110 lb 3.2 oz (50 kg)  12/25/20 108 lb (49 kg)     GEN:  Well nourished, well developed in no acute distress.  Thin HEENT: Normal NECK: No JVD; No carotid bruits LYMPHATICS: No lymphadenopathy CARDIAC: Irregularly irregular, no murmurs, rubs, gallops RESPIRATORY:  Clear to auscultation without rales, wheezing or rhonchi  ABDOMEN: Soft, non-tender, non-distended MUSCULOSKELETAL:  No edema; No deformity  SKIN: Warm and dry NEUROLOGIC:  Alert and oriented x 3 PSYCHIATRIC:  Normal affect        ASSESSMENT:    1. Permanent atrial fibrillation ([place])   2. Chronic diastolic CHF (congestive heart failure) ([place])   3. Chronic obstructive pulmonary disease, unspecified COPD type ([place])    PLAN:    In order of problems listed above:  #Permanent atrial fibrillation I suspect her A. fib and RVR with exertion is contributing to her symptoms of shortness of breath with exertion.  This is been seen during hospitalizations in the past as well when she would present with A. fib with RVR.  Her EF is normal.  Her RV is normal.  She is on amiodarone but this has been unsuccessful in maintaining sinus rhythm.  I do not think long-term amiodarone use is a good idea given her history of  COPD.  I discussed management options with the patient including pacemaker plus AV junction ablation.  I discussed how there is no guarantee that pacemaker implant followed by AV junction ablation will improve symptoms but is her best chance at eliminating atrial fibrillation as a possibility and removing amiodarone from her medication list.  She would like to proceed with scheduling.  We will plan to implant a single-chamber left bundle area pacemaker lead from [employer].  She will hold her Eliquis for 3 days prior to the implant.  We will then schedule a AV junction ablation 4 to 6 weeks after the pacemaker implant to allow the lead to mature.  She will stop her amiodarone after the AV junction ablation.  I discussed the risks of both procedures in detail with the patient during today's visit.  Risks, benefits, alternatives to PPM implantation were discussed in detail with the patient today. The patient understands that the risks include but are not limited to bleeding, infection, pneumothorax, perforation, tamponade, vascular damage, renal failure, MI, stroke, death, and lead dislodgement and wishes to proceed.  We will therefore schedule device implantation at the next available time.  Risk, benefits, and alternatives to radiofrequency ablation of the AVJ were also discussed in detail today. These risks include but are not limited to stroke, bleeding, vascular damage, tamponade, perforation, and death. The patient understands these risk and wishes to proceed.  We will therefore proceed with catheter ablation at the next available time.  Carto is requested for the procedure.     Total time spent with patient today 42 minutes. This includes reviewing records, evaluating the patient and coordinating care.   Medication Adjustments/Labs and Tests Ordered: Current medicines are reviewed at length with the patient today.  Concerns regarding medicines are outlined above.  No orders of the defined types were  placed in this encounter.  No orders of the defined types were placed in this encounter.    Signed, Lars Mage, MD, [facility], [facility] 02/21/2021 12:19 PM    Electrophysiology [facility]

## 2021-02-21 ENCOUNTER — Other Ambulatory Visit: Payer: Self-pay

## 2021-02-21 ENCOUNTER — Ambulatory Visit (INDEPENDENT_AMBULATORY_CARE_PROVIDER_SITE_OTHER): Payer: Medicare HMO | Admitting: Cardiology

## 2021-02-21 ENCOUNTER — Encounter: Payer: Self-pay | Admitting: *Deleted

## 2021-02-21 ENCOUNTER — Encounter: Payer: Self-pay | Admitting: Cardiology

## 2021-02-21 VITALS — BP 120/80 | HR 91 | Ht 63.0 in | Wt 109.0 lb

## 2021-02-21 DIAGNOSIS — J449 Chronic obstructive pulmonary disease, unspecified: Secondary | ICD-10-CM

## 2021-02-21 DIAGNOSIS — I5032 Chronic diastolic (congestive) heart failure: Secondary | ICD-10-CM | POA: Diagnosis not present

## 2021-02-21 DIAGNOSIS — I4821 Permanent atrial fibrillation: Secondary | ICD-10-CM

## 2021-02-21 NOTE — Patient Instructions (Addendum)
Medications: Your physician recommends that you continue on your current medications as directed. Please refer to the Current Medication list given to you today. *If you need a refill on your cardiac medications before your next appointment, please call your pharmacy*  Lab Work: None. If you have labs (blood work) drawn today and your tests are completely normal, you will receive your results only by: MyChart Message (if you have MyChart) OR A paper copy in the mail If you have any lab test that is abnormal or we need to change your treatment, we will call you to review the results.  Testing/Procedures: Your physician has recommended that you have a pacemaker inserted. A pacemaker is a small device that is placed under the skin of your chest or abdomen to help control abnormal heart rhythms. This device uses electrical pulses to prompt the heart to beat at a normal rate. Pacemakers are used to treat heart rhythms that are too slow. Wire (leads) are attached to the pacemaker that goes into the chambers of you heart. This is done in the hospital and usually requires and overnight stay. Please see the instruction sheet given to you today for more information.   Your physician has recommended that you have an ablation. Catheter ablation is a medical procedure used to treat some cardiac arrhythmias (irregular heartbeats). During catheter ablation, a long, thin, flexible tube is put into a blood vessel in your groin (upper thigh), or neck. This tube is called an ablation catheter. It is then guided to your heart through the blood vessel. Radio frequency waves destroy small areas of heart tissue where abnormal heartbeats may cause an arrhythmia to start. Please see the instruction sheet given to you today.   Follow-Up: [facility], you and your health needs are our priority.  As part of our continuing mission to provide you with exceptional heart care, we have created designated Provider Care Teams.   These Care Teams include your primary Cardiologist (physician) and Advanced Practice Providers (APPs -  Physician Assistants and Nurse Practitioners) who all work together to provide you with the care you need, when you need it.  Your physician wants you to follow-up in: 91 days post PPM implant (Feb 21) and 4 weeks post ablation ( March 21)   We recommend signing up for the patient portal called "MyChart".  Sign up information is provided on this After Visit Summary.  MyChart is used to connect with patients for Virtual Visits (Telemedicine).  Patients are able to view lab/test results, encounter notes, upcoming appointments, etc.  Non-urgent messages can be sent to your provider as well.   To learn more about what you can do with MyChart, go to [email or web address].    Any Other Special Instructions Will Be Listed Below (If Applicable).  Cardiac Ablation Cardiac ablation is a procedure to destroy (ablate) some heart tissue that is sending bad signals. These bad signals cause problems in heart rhythm. The heart has many areas that make these signals. If there are problems in these areas, they can make the heart beat in a way that is not normal. Destroying some tissues can help make the heart rhythm normal. Tell your doctor about: Any allergies you have. All medicines you are taking. These include vitamins, herbs, eye drops, creams, and over-the-counter medicines. Any problems you or family members have had with medicines that make you fall asleep (anesthetics). Any blood disorders you have. Any surgeries you have had. Any medical conditions you have, such as kidney  failure. Whether you are pregnant or may be pregnant. What are the risks? This is a safe procedure. But problems may occur, including: Infection. Bruising and bleeding. Bleeding into the chest. Stroke or blood clots. Damage to nearby areas of your body. Allergies to medicines or dyes. The need for a pacemaker if the normal  system is damaged. Failure of the procedure to treat the problem. What happens before the procedure? Medicines Ask your doctor about: Changing or stopping your normal medicines. This is important. Taking aspirin and ibuprofen. Do not take these medicines unless your doctor tells you to take them. Taking other medicines, vitamins, herbs, and supplements. General instructions Follow instructions from your doctor about what you cannot eat or drink. Plan to have someone take you home from the hospital or clinic. If you will be going home right after the procedure, plan to have someone with you for 24 hours. Ask your doctor what steps will be taken to prevent infection. What happens during the procedure?  An IV tube will be put into one of your veins. You will be given a medicine to help you relax. The skin on your neck or groin will be numbed. A cut (incision) will be made in your neck or groin. A needle will be put through your cut and into a large vein. A tube (catheter) will be put into the needle. The tube will be moved to your heart. Dye may be put through the tube. This helps your doctor see your heart. Small devices (electrodes) on the tube will send out signals. A type of energy will be used to destroy some heart tissue. The tube will be taken out. Pressure will be held on your cut. This helps stop bleeding. A bandage will be put over your cut. The exact procedure may vary among doctors and hospitals. What happens after the procedure? You will be watched until you leave the hospital or clinic. This includes checking your heart rate, breathing rate, oxygen, and blood pressure. Your cut will be watched for bleeding. You will need to lie still for a few hours. Do not drive for 24 hours or as long as your doctor tells you. Summary Cardiac ablation is a procedure to destroy some heart tissue. This is done to treat heart rhythm problems. Tell your doctor about any medical conditions  you may have. Tell him or her about all medicines you are taking to treat them. This is a safe procedure. But problems may occur. These include infection, bruising, bleeding, and damage to nearby areas of your body. Follow what your doctor tells you about food and drink. You may also be told to change or stop some of your medicines. After the procedure, do not drive for 24 hours or as long as your doctor tells you. This information is not intended to replace advice given to you by your health care provider. Make sure you discuss any questions you have with your health care provider. Document Revised: 12/24/2018 Document Reviewed: 12/24/2018 Elsevier Patient Education  2022 Else[employer]. Pacemaker Implantation, Adult Pacemaker implantation is a procedure to place a pacemaker inside the chest. A pacemaker is a small computer that sends electrical signals to the heart and helps the heart beat normally. A pacemaker also stores information about heart rhythms. You may need pacemaker implantation if you have: A slow heartbeat (bradycardia). Loss of consciousness that happens repeatedly (syncope) or repeated episodes of dizziness or light-headedness because of an irregular heart rate. Shortness of breath (dyspnea) due to  heart problems. The pacemaker usually attaches to your heart through a wire called a lead. One or two leads may be needed. There are different types of pacemakers: Transvenous pacemaker. This type is placed under the skin or muscle of your upper chest area. The lead goes through a vein in the chest area to reach the inside of the heart. Epicardial pacemaker. This type is placed under the skin or muscle of your chest or abdomen. The lead goes through your chest to the outside of the heart. Tell a health care provider about: Any allergies you have. All medicines you are taking, including vitamins, herbs, eye drops, creams, and over-the-counter medicines. Any problems you or family members  have had with anesthetic medicines. Any blood or bone disorders you have. Any surgeries you have had. Any medical conditions you have. Whether you are pregnant or may be pregnant. What are the risks? Generally, this is a safe procedure. However, problems may occur, including: Infection. Bleeding. Failure of the pacemaker or the lead. Collapse of a lung or bleeding into a lung. Blood clot inside a blood vessel with a lead. Damage to the heart. Infection inside the heart (endocarditis). Allergic reactions to medicines. What happens before the procedure? Staying hydrated Follow instructions from your health care provider about hydration, which may include: Up to 2 hours before the procedure - you may continue to drink clear liquids, such as water, clear fruit juice, black coffee, and plain tea.  Eating and drinking restrictions Follow instructions from your health care provider about eating and drinking, which may include: 8 hours before the procedure - stop eating heavy meals or foods, such as meat, fried foods, or fatty foods. 6 hours before the procedure - stop eating light meals or foods, such as toast or cereal. 6 hours before the procedure - stop drinking milk or drinks that contain milk. 2 hours before the procedure - stop drinking clear liquids. Medicines Ask your health care provider about: Changing or stopping your regular medicines. This is especially important if you are taking diabetes medicines or blood thinners. Taking medicines such as aspirin and ibuprofen. These medicines can thin your blood. Do not take these medicines unless your health care provider tells you to take them. Taking over-the-counter medicines, vitamins, herbs, and supplements. Tests You may have: A heart evaluation. This may include: An electrocardiogram (ECG). This involves placing patches on your skin to check your heart rhythm. A chest X-ray. An echocardiogram. This is a test that uses sound  waves (ultrasound) to produce an image of the heart. A cardiac rhythm monitor. This is used to record your heart rhythm and any events for a longer period of time. Blood tests. Genetic testing. General instructions Do not use any products that contain nicotine or tobacco for at least 4 weeks before the procedure. These products include cigarettes, e-cigarettes, and chewing tobacco. If you need help quitting, ask your health care provider. Ask your health care provider: How your surgery site will be marked. What steps will be taken to help prevent infection. These steps may include: Removing hair at the surgery site. Washing skin with a germ-killing soap. Receiving antibiotic medicine. Plan to have someone take you home from the hospital or clinic. If you will be going home right after the procedure, plan to have someone with you for 24 hours. What happens during the procedure? An IV will be inserted into one of your veins. You will be given one or more of the following: A medicine to  help you relax (sedative). A medicine to numb the area (local anesthetic). A medicine to make you fall asleep (general anesthetic). The next steps vary depending on the type of pacemaker you will be getting. If you are getting a transvenous pacemaker: An incision will be made in your upper chest. A pocket will be made for the pacemaker. It may be placed under the skin or between layers of muscle. The lead will be inserted into a blood vessel that goes to the heart. While X-rays are taken by an imaging machine (fluoroscopy), the lead will be advanced through the vein to the inside of your heart. The other end of the lead will be tunneled under the skin and attached to the pacemaker. If you are getting an epicardial pacemaker: An incision will be made near your ribs or breastbone (sternum) for the lead. The lead will be attached to the outside of your heart. Another incision will be made in your chest or  upper abdomen to create a pocket for the pacemaker. The free end of the lead will be tunneled under the skin and attached to the pacemaker. The transvenous or epicardial pacemaker will be tested. Imaging studies may be done to check the lead position. The incisions will be closed with stitches (sutures), adhesive strips, or skin glue. Bandages (dressings) will be placed over the incisions. The procedure may vary among health care providers and hospitals. What happens after the procedure? Your blood pressure, heart rate, breathing rate, and blood oxygen level will be monitored until you leave the hospital or clinic. You may be given antibiotics. You will be given pain medicine. An ECG and chest X-rays will be done. You may need to wear a continuous type of ECG (Holter monitor) to check your heart rhythm. Your health care provider will program the pacemaker. If you were given a sedative during the procedure, it can affect you for several hours. Do not drive or operate machinery until your health care provider says that it is safe. You will be given a pacemaker identification card. This card lists the implant date, device model, and manufacturer of your pacemaker. Summary A pacemaker is a small computer that sends electrical signals to the heart and helps the heart beat normally. There are different types of pacemakers. A pacemaker may be placed under the skin or muscle of your chest or abdomen. Follow instructions from your health care provider about eating and drinking and about taking medicines before the procedure. This information is not intended to replace advice given to you by your health care provider. Make sure you discuss any questions you have with your health care provider. Document Revised: 10/03/2020 Document Reviewed: 12/23/2018 Elsevier Patient Education  2022 [employer].

## 2021-02-22 ENCOUNTER — Ambulatory Visit: Payer: Medicare HMO | Admitting: Family

## 2021-03-02 ENCOUNTER — Telehealth: Payer: Self-pay | Admitting: Cardiovascular Disease

## 2021-03-02 DIAGNOSIS — I4891 Unspecified atrial fibrillation: Secondary | ICD-10-CM

## 2021-03-02 MED ORDER — APIXABAN 5 MG PO TABS: 5.0000 mg | ORAL_TABLET | Freq: Two times a day (BID) | ORAL | 1 refills | Status: DC

## 2021-03-02 NOTE — Telephone Encounter (Signed)
°*  STAT* If patient is at the pharmacy, call can be transferred to refill team.   1. Which medications need to be refilled? (please list name of each medication and dose if known) Eliquis 5 MG 1 tablet 2 times daily   2. Which pharmacy/location (including street and city if local pharmacy) is medication to be sent to? [facility]  3. Do they need a 30 day or 90 day supply? 90 day

## 2021-03-02 NOTE — Telephone Encounter (Signed)
Prescription refill request for Eliquis received. Indication: a fib, thrombus Last office visit: 1 /18/23 Scr: 0.9 Age: 71 Weight: 49kg

## 2021-03-06 ENCOUNTER — Other Ambulatory Visit: Payer: Medicare HMO

## 2021-03-26 ENCOUNTER — Telehealth: Payer: Self-pay | Admitting: *Deleted

## 2021-03-26 NOTE — Telephone Encounter (Addendum)
Message from [place] in the Cath Lab.  Called Ms Hammer to go over procedure instructions for tomorrow.  She states she doesn't want to have the procedure.  Victorino Dike canceled procedure.   Spoke to the patient to confirm. Stated "I just don't want to do it right now, I might change my mind later. " Notified patient her AV node ablation would need to be canceled as well that is in March verbalized understanding and agreement.   Went to cancel labs and post op appointments. Looks like they have already been canceled on 02/22/21.  With this change will check with Dr. Lalla Brothers for when her would like her to follow up.

## 2021-03-26 NOTE — Pre-Procedure Instructions (Signed)
Called patient to go over procedure instructions for tomorrow.  Patients she doesn't want to have the pacemaker put in tomorrow.   I will notify Dr Lalla Brothers.

## 2021-03-27 ENCOUNTER — Ambulatory Visit (HOSPITAL_COMMUNITY): Admission: RE | Admit: 2021-03-27 | Payer: Medicare HMO | Source: Home / Self Care | Admitting: Cardiology

## 2021-03-27 ENCOUNTER — Encounter (HOSPITAL_COMMUNITY): Admission: RE | Payer: Medicare HMO | Source: Home / Self Care

## 2021-03-27 SURGERY — PACEMAKER IMPLANT

## 2021-04-03 ENCOUNTER — Other Ambulatory Visit: Payer: Medicare HMO

## 2021-04-04 ENCOUNTER — Ambulatory Visit: Payer: Medicare HMO

## 2021-04-11 ENCOUNTER — Ambulatory Visit: Payer: Medicare HMO

## 2021-04-24 ENCOUNTER — Ambulatory Visit (HOSPITAL_COMMUNITY): Admit: 2021-04-24 | Payer: Medicare HMO | Admitting: Cardiology

## 2021-04-24 ENCOUNTER — Encounter (HOSPITAL_COMMUNITY): Payer: Medicare HMO

## 2021-04-24 SURGERY — AV NODE ABLATION
Anesthesia: Moderate Sedation

## 2021-05-23 ENCOUNTER — Encounter: Payer: Medicare HMO | Admitting: Cardiology

## 2021-06-21 ENCOUNTER — Telehealth: Payer: Self-pay | Admitting: Cardiovascular Disease

## 2021-06-21 ENCOUNTER — Other Ambulatory Visit: Payer: Self-pay

## 2021-06-21 MED ORDER — AMIODARONE HCL 200 MG PO TABS: 200.0000 mg | ORAL_TABLET | Freq: Every day | ORAL | 0 refills | Status: DC

## 2021-06-21 NOTE — Telephone Encounter (Signed)
*  STAT* If patient is at the pharmacy, call can be transferred to refill team.   1. Which medications need to be refilled? (please list name of each medication and dose if known) amiodarone (PACERONE) 200 MG tablet  2. Which pharmacy/location (including street and city if local pharmacy) is medication to be sent to? [facility] - , [place] - [address]  3. Do they need a 30 day or 90 day supply? 90

## 2021-06-21 NOTE — Telephone Encounter (Signed)
  Pt c/o medication issue:  1. Name of Medication: amiodarone (PACERONE) 200 MG tablet  2. How are you currently taking this medication (dosage and times per day)? Take 1 tablet (200 mg total) by mouth daily.  3. Are you having a reaction (difficulty breathing--STAT)?   4. What is your medication issue? Leonette Most drew [facility] calling because they received a prescription for amiodarone, he said, pt is also taking diltiazem and there's drug interaction between the two medication. They would like to clarify if its ok to  fill the amiodarone

## 2021-06-21 NOTE — Telephone Encounter (Signed)
Attempted to call the phamacy at [facility]. Message received that the pharmacy is closed.  Will need to attempt to contact them tomorrow.

## 2021-06-21 NOTE — Telephone Encounter (Signed)
amiodarone (PACERONE) 200 MG tablet 90 tablet 3 12/27/2020    Sig - Route: Take 1 tablet (200 mg total) by mouth daily. - Oral   Sent to pharmacy as: amiodarone (PACERONE) 200 MG tablet   E-Prescribing Status: Receipt confirmed by pharmacy (12/27/2020 11:45 AM EST)    Pharmacy  [facility] - [place], [place] - [address]   One year supply sent to pharmacy on 12/27/20

## 2021-06-24 ENCOUNTER — Emergency Department: Payer: Medicare HMO

## 2021-06-24 ENCOUNTER — Inpatient Hospital Stay
Admission: EM | Admit: 2021-06-24 | Discharge: 2021-06-27 | DRG: 291 | Disposition: A | Discharge status: Home / Self Care | Payer: Medicare HMO | Attending: Hospitalist | Admitting: Hospitalist

## 2021-06-24 ENCOUNTER — Other Ambulatory Visit: Payer: Self-pay

## 2021-06-24 DIAGNOSIS — Z7901 Long term (current) use of anticoagulants: Secondary | ICD-10-CM

## 2021-06-24 DIAGNOSIS — I10 Essential (primary) hypertension: Secondary | ICD-10-CM | POA: Diagnosis present

## 2021-06-24 DIAGNOSIS — Z8249 Family history of ischemic heart disease and other diseases of the circulatory system: Secondary | ICD-10-CM

## 2021-06-24 DIAGNOSIS — I4819 Other persistent atrial fibrillation: Secondary | ICD-10-CM | POA: Diagnosis present

## 2021-06-24 DIAGNOSIS — J441 Chronic obstructive pulmonary disease with (acute) exacerbation: Secondary | ICD-10-CM

## 2021-06-24 DIAGNOSIS — E876 Hypokalemia: Secondary | ICD-10-CM | POA: Diagnosis present

## 2021-06-24 DIAGNOSIS — E782 Mixed hyperlipidemia: Secondary | ICD-10-CM | POA: Diagnosis present

## 2021-06-24 DIAGNOSIS — I110 Hypertensive heart disease with heart failure: Principal | ICD-10-CM | POA: Diagnosis present

## 2021-06-24 DIAGNOSIS — Z79899 Other long term (current) drug therapy: Secondary | ICD-10-CM

## 2021-06-24 DIAGNOSIS — I081 Rheumatic disorders of both mitral and tricuspid valves: Secondary | ICD-10-CM | POA: Diagnosis present

## 2021-06-24 DIAGNOSIS — I513 Intracardiac thrombosis, not elsewhere classified: Secondary | ICD-10-CM | POA: Diagnosis present

## 2021-06-24 DIAGNOSIS — K219 Gastro-esophageal reflux disease without esophagitis: Secondary | ICD-10-CM | POA: Diagnosis present

## 2021-06-24 DIAGNOSIS — I5033 Acute on chronic diastolic (congestive) heart failure: Secondary | ICD-10-CM | POA: Diagnosis not present

## 2021-06-24 DIAGNOSIS — Z7951 Long term (current) use of inhaled steroids: Secondary | ICD-10-CM

## 2021-06-24 DIAGNOSIS — Z885 Allergy status to narcotic agent status: Secondary | ICD-10-CM | POA: Diagnosis not present

## 2021-06-24 DIAGNOSIS — I5031 Acute diastolic (congestive) heart failure: Secondary | ICD-10-CM | POA: Diagnosis not present

## 2021-06-24 DIAGNOSIS — F32A Depression, unspecified: Secondary | ICD-10-CM | POA: Diagnosis present

## 2021-06-24 DIAGNOSIS — J9621 Acute and chronic respiratory failure with hypoxia: Secondary | ICD-10-CM | POA: Diagnosis present

## 2021-06-24 DIAGNOSIS — Z20822 Contact with and (suspected) exposure to covid-19: Secondary | ICD-10-CM | POA: Diagnosis present

## 2021-06-24 DIAGNOSIS — I5043 Acute on chronic combined systolic (congestive) and diastolic (congestive) heart failure: Secondary | ICD-10-CM | POA: Diagnosis present

## 2021-06-24 DIAGNOSIS — F17210 Nicotine dependence, cigarettes, uncomplicated: Secondary | ICD-10-CM | POA: Diagnosis present

## 2021-06-24 DIAGNOSIS — N179 Acute kidney failure, unspecified: Secondary | ICD-10-CM | POA: Diagnosis not present

## 2021-06-24 DIAGNOSIS — Z720 Tobacco use: Secondary | ICD-10-CM | POA: Diagnosis present

## 2021-06-24 DIAGNOSIS — Z825 Family history of asthma and other chronic lower respiratory diseases: Secondary | ICD-10-CM

## 2021-06-24 LAB — BASIC METABOLIC PANEL
Anion gap: 10 (ref 5–15)
BUN: 19 mg/dL (ref 8–23)
CO2: 35 mmol/L — ABNORMAL HIGH (ref 22–32)
Calcium: 8.2 mg/dL — ABNORMAL LOW (ref 8.9–10.3)
Chloride: 95 mmol/L — ABNORMAL LOW (ref 98–111)
Creatinine, Ser: 1.19 mg/dL — ABNORMAL HIGH (ref 0.44–1.00)
GFR, Estimated: 49 mL/min — ABNORMAL LOW (ref >60)
Glucose, Bld: 102 mg/dL — ABNORMAL HIGH (ref 70–99)
Potassium: 3 mmol/L — ABNORMAL LOW (ref 3.5–5.1)
Sodium: 140 mmol/L (ref 135–145)

## 2021-06-24 LAB — CBC
HCT: 39.1 % (ref 36.0–46.0)
Hemoglobin: 12.3 g/dL (ref 12.0–15.0)
MCH: 32.5 pg (ref 26.0–34.0)
MCHC: 31.5 g/dL (ref 30.0–36.0)
MCV: 103.2 fL — ABNORMAL HIGH (ref 80.0–100.0)
Platelets: 262 10*3/uL (ref 150–400)
RBC: 3.79 MIL/uL — ABNORMAL LOW (ref 3.87–5.11)
RDW: 13.3 % (ref 11.5–15.5)
WBC: 8.6 10*3/uL (ref 4.0–10.5)
nRBC: 0 % (ref 0.0–0.2)

## 2021-06-24 LAB — PROCALCITONIN: Procalcitonin: <0.1 ng/mL

## 2021-06-24 LAB — RESP PANEL BY RT-PCR (FLU A&B, COVID) ARPGX2
Influenza A by PCR: NEGATIVE
Influenza B by PCR: NEGATIVE
SARS Coronavirus 2 by RT PCR: NEGATIVE

## 2021-06-24 LAB — LACTIC ACID, PLASMA
Lactic Acid, Venous: 2 mmol/L (ref 0.5–1.9)
Lactic Acid, Venous: 1.7 mmol/L (ref 0.5–1.9)

## 2021-06-24 LAB — MAGNESIUM: Magnesium: 1.8 mg/dL (ref 1.7–2.4)

## 2021-06-24 LAB — BRAIN NATRIURETIC PEPTIDE: B Natriuretic Peptide: 965.1 pg/mL — ABNORMAL HIGH (ref 0.0–100.0)

## 2021-06-24 MED ORDER — METHYLPREDNISOLONE SODIUM SUCC 125 MG IJ SOLR
125.0000 mg | INTRAMUSCULAR | Status: AC
  Administered 2021-06-24: 125 mg via INTRAVENOUS
  Filled 2021-06-24: qty 2

## 2021-06-24 MED ORDER — ADULT MULTIVITAMIN W/MINERALS CH
1.0000 | ORAL_TABLET | Freq: Every day | ORAL | Status: DC
  Administered 2021-06-24 – 2021-06-27 (×4): 1 via ORAL
  Filled 2021-06-24 (×4): qty 1

## 2021-06-24 MED ORDER — AMIODARONE HCL 200 MG PO TABS
200.0000 mg | ORAL_TABLET | Freq: Every day | ORAL | Status: DC
  Administered 2021-06-25 – 2021-06-27 (×3): 200 mg via ORAL
  Filled 2021-06-24 (×3): qty 1

## 2021-06-24 MED ORDER — PANTOPRAZOLE SODIUM 40 MG PO TBEC
40.0000 mg | DELAYED_RELEASE_TABLET | Freq: Every day | ORAL | Status: DC
  Administered 2021-06-25 – 2021-06-27 (×3): 40 mg via ORAL
  Filled 2021-06-24 (×3): qty 1

## 2021-06-24 MED ORDER — ASCORBIC ACID 500 MG PO TABS
1000.0000 mg | ORAL_TABLET | Freq: Two times a day (BID) | ORAL | Status: DC
  Administered 2021-06-24 – 2021-06-27 (×6): 1000 mg via ORAL
  Filled 2021-06-24 (×6): qty 2

## 2021-06-24 MED ORDER — AZITHROMYCIN 250 MG PO TABS
250.0000 mg | ORAL_TABLET | Freq: Every day | ORAL | Status: DC
  Administered 2021-06-25: 250 mg via ORAL
  Filled 2021-06-24: qty 1

## 2021-06-24 MED ORDER — IPRATROPIUM-ALBUTEROL 0.5-2.5 (3) MG/3ML IN SOLN
3.0000 mL | Freq: Once | RESPIRATORY_TRACT | Status: AC
  Administered 2021-06-24: 3 mL via RESPIRATORY_TRACT
  Filled 2021-06-24: qty 3

## 2021-06-24 MED ORDER — METHYLPREDNISOLONE SODIUM SUCC 40 MG IJ SOLR
40.0000 mg | Freq: Two times a day (BID) | INTRAMUSCULAR | Status: DC
  Administered 2021-06-25 – 2021-06-27 (×4): 40 mg via INTRAVENOUS
  Filled 2021-06-24 (×5): qty 1

## 2021-06-24 MED ORDER — AZITHROMYCIN 250 MG PO TABS
500.0000 mg | ORAL_TABLET | Freq: Every day | ORAL | Status: AC
  Administered 2021-06-24: 500 mg via ORAL
  Filled 2021-06-24: qty 2

## 2021-06-24 MED ORDER — ALBUTEROL SULFATE (2.5 MG/3ML) 0.083% IN NEBU
5.0000 mg | INHALATION_SOLUTION | RESPIRATORY_TRACT | Status: AC
  Administered 2021-06-24: 5 mg via RESPIRATORY_TRACT

## 2021-06-24 MED ORDER — METOPROLOL TARTRATE 50 MG PO TABS
75.0000 mg | ORAL_TABLET | Freq: Two times a day (BID) | ORAL | Status: DC
  Administered 2021-06-25 – 2021-06-27 (×5): 75 mg via ORAL
  Filled 2021-06-24 (×6): qty 2

## 2021-06-24 MED ORDER — DIPHENHYDRAMINE HCL 50 MG/ML IJ SOLN
12.5000 mg | Freq: Three times a day (TID) | INTRAMUSCULAR | Status: DC | PRN
Start: 2021-06-24 — End: 2021-06-27

## 2021-06-24 MED ORDER — VITAMIN E 45 MG (100 UNIT) PO CAPS
400.0000 [IU] | ORAL_CAPSULE | Freq: Two times a day (BID) | ORAL | Status: DC
  Administered 2021-06-25 – 2021-06-27 (×5): 400 [IU] via ORAL
  Filled 2021-06-24 (×5): qty 4

## 2021-06-24 MED ORDER — APIXABAN 5 MG PO TABS
5.0000 mg | ORAL_TABLET | Freq: Two times a day (BID) | ORAL | Status: DC
  Administered 2021-06-24 – 2021-06-27 (×6): 5 mg via ORAL
  Filled 2021-06-24 (×6): qty 1

## 2021-06-24 MED ORDER — ALBUTEROL SULFATE (2.5 MG/3ML) 0.083% IN NEBU: 2.5000 mg | INHALATION_SOLUTION | RESPIRATORY_TRACT | Status: DC | PRN

## 2021-06-24 MED ORDER — DILTIAZEM HCL ER COATED BEADS 120 MG PO CP24
120.0000 mg | ORAL_CAPSULE | Freq: Every day | ORAL | Status: DC
  Administered 2021-06-25 – 2021-06-27 (×3): 120 mg via ORAL
  Filled 2021-06-24 (×3): qty 1

## 2021-06-24 MED ORDER — FUROSEMIDE 10 MG/ML IJ SOLN
60.0000 mg | Freq: Two times a day (BID) | INTRAMUSCULAR | Status: DC
  Administered 2021-06-24 – 2021-06-26 (×5): 60 mg via INTRAVENOUS
  Filled 2021-06-24 (×6): qty 6

## 2021-06-24 MED ORDER — MOMETASONE FURO-FORMOTEROL FUM 200-5 MCG/ACT IN AERO
2.0000 | INHALATION_SPRAY | Freq: Two times a day (BID) | RESPIRATORY_TRACT | Status: DC
  Administered 2021-06-24 – 2021-06-27 (×6): 2 via RESPIRATORY_TRACT
  Filled 2021-06-24: qty 8.8

## 2021-06-24 MED ORDER — HYDRALAZINE HCL 20 MG/ML IJ SOLN: 5.0000 mg | INTRAMUSCULAR | Status: DC | PRN

## 2021-06-24 MED ORDER — ACETAMINOPHEN 325 MG PO TABS
650.0000 mg | ORAL_TABLET | Freq: Four times a day (QID) | ORAL | Status: DC | PRN
Start: 2021-06-24 — End: 2021-06-27

## 2021-06-24 MED ORDER — IPRATROPIUM-ALBUTEROL 0.5-2.5 (3) MG/3ML IN SOLN: 3.0000 mL | Freq: Four times a day (QID) | RESPIRATORY_TRACT | Status: DC

## 2021-06-24 MED ORDER — FLUOXETINE HCL 20 MG PO CAPS
40.0000 mg | ORAL_CAPSULE | Freq: Every day | ORAL | Status: DC
  Administered 2021-06-25 – 2021-06-27 (×3): 40 mg via ORAL
  Filled 2021-06-24 (×3): qty 2

## 2021-06-24 MED ORDER — POTASSIUM CHLORIDE 20 MEQ PO PACK
60.0000 meq | PACK | ORAL | Status: AC
  Administered 2021-06-24: 60 meq via ORAL
  Filled 2021-06-24: qty 3

## 2021-06-24 MED ORDER — NICOTINE 21 MG/24HR TD PT24
21.0000 mg | MEDICATED_PATCH | Freq: Every day | TRANSDERMAL | Status: DC
Start: 2021-06-24 — End: 2021-06-27
  Administered 2021-06-25: 21 mg via TRANSDERMAL
  Filled 2021-06-24 (×3): qty 1

## 2021-06-24 MED ORDER — PREDNISONE 20 MG PO TABS
40.0000 mg | ORAL_TABLET | Freq: Once | ORAL | Status: AC
  Administered 2021-06-24: 40 mg via ORAL
  Filled 2021-06-24: qty 2

## 2021-06-24 MED ORDER — IPRATROPIUM-ALBUTEROL 0.5-2.5 (3) MG/3ML IN SOLN
3.0000 mL | RESPIRATORY_TRACT | Status: DC
  Administered 2021-06-24: 3 mL via RESPIRATORY_TRACT
  Filled 2021-06-24 (×2): qty 3

## 2021-06-24 MED ORDER — DM-GUAIFENESIN ER 30-600 MG PO TB12: 1.0000 | ORAL_TABLET | Freq: Two times a day (BID) | ORAL | Status: DC | PRN

## 2021-06-24 MED ORDER — AMOXICILLIN-POT CLAVULANATE 875-125 MG PO TABS
1.0000 | ORAL_TABLET | Freq: Once | ORAL | Status: AC
  Administered 2021-06-24: 1 via ORAL
  Filled 2021-06-24: qty 1

## 2021-06-24 NOTE — Assessment & Plan Note (Addendum)
-  Bronchodilators -Solu-Medrol 40 mg IV bid-- change to oral taper from tomorrow -chest x-ray negative for pneumonia. Pro calcitonin negative. White count normal. No indication for antibiotic. -Mucinex for cough  -Incentive spirometry -Nasal cannula oxygen as needed to maintain O2 saturation 93% or greater -- will assess for home oxygen use.

## 2021-06-24 NOTE — Assessment & Plan Note (Addendum)
2D echo on 11/25/2020 showed EF 60 to 65%. -Lasix 60 mg bid by IV-- change to oral Lasix 40 mg daily from [place] -Daily weights -strict I/O's -Low salt diet -Fluid restriction

## 2021-06-24 NOTE — ED Notes (Signed)
During duo-neb at 15 liters pt oxygen still only at 88-90%.
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
## 2021-06-24 NOTE — Assessment & Plan Note (Addendum)
-  continue Eliquis, amiodarone, Cardizem, metoprolol

## 2021-06-24 NOTE — Assessment & Plan Note (Signed)
-  IV IV hydralazine as needed -Cardizem, metoprolol -Patient is on IV Lasix

## 2021-06-24 NOTE — Progress Notes (Deleted)
Cardiology Office Note    Date:  06/24/2021   ID:  Angela Singh, DOB 09/20/1950, MRN [id number]  PCP:  Letta Median, MD  Cardiologist:  Ida Rogue, MD  Electrophysiologist:  Vickie Epley, MD   Chief Complaint: ***  History of Present Illness:   Angela Singh is a 71 y.o. female with history of ***  ***   Labs independently reviewed: 12/2020 - Free T4 normal, TSH 8.950 11/2020 - potassium 4.3, BUN 16, serum creatinine 0.9, BNP 826, Hgb 13.2, PLT 193, magnesium 2.1, albumin 2.9, AST 111, ALT 125 10/2020 - TC 161, TG 68, HDL 54, LDL 93, A1c 6.0  Past Medical History:  Diagnosis Date   Chronic combined systolic (congestive) and diastolic (congestive) heart failure ([place])    a. 09/2020 Echo: EF 60-65%. No rwma. Nl RV fxn. RVSP 38.52mmHg. Mild to mod MR. Mod TR; b. 10/2020 TEE: EF 45-50%, no rwma, nl RV fxn, RVSP 29.47mmHg, mild to mod MR/TR, Ao sclerosis, small PFO w/ R->L shunting. Mod dil LA.   COPD (chronic obstructive pulmonary disease) (HCC)    Essential hypertension    GERD (gastroesophageal reflux disease)    Mitral regurgitation    Mixed hyperlipidemia    Persistent atrial fibrillation (HCC)    a. Dx 09/2020. CHA2DS2VASc = 3-->Eliquis; b. 10/26/2020 TEE/DCCV: No LA thrombus-->successful DCCV; c. 10/2020 Recurrent Afib-->amio load-->DCCV-->recurrent afib.   Thrombus of left atrial appendage    a. 09/2020 noted on TEE in setting of Afib-->eliquis; b. 10/2020 TEE: no LA thrombus.   Tobacco abuse    Tricuspid regurgitation     Past Surgical History:  Procedure Laterality Date   CARDIOVERSION N/A 10/26/2020   Procedure: CARDIOVERSION;  Surgeon: Minna Merritts, MD;  Location: [facility];  Service: Cardiovascular;  Laterality: N/A;   CARDIOVERSION N/A 11/01/2020   Procedure: CARDIOVERSION;  Surgeon: Kate Sable, MD;  Location: [facility];  Service: Cardiovascular;  Laterality: N/A;   CARDIOVERSION N/A 11/23/2020   Procedure: CARDIOVERSION;  Surgeon: Minna Merritts, MD;  Location: [facility];  Service: Cardiovascular;  Laterality: N/A;   TEE WITHOUT CARDIOVERSION N/A 09/25/2020   Procedure: TRANSESOPHAGEAL ECHOCARDIOGRAM (TEE);  Surgeon: Wellington Hampshire, MD;  Location: [facility];  Service: Cardiovascular;  Laterality: N/A;   TEE WITHOUT CARDIOVERSION N/A 10/26/2020   Procedure: TRANSESOPHAGEAL ECHOCARDIOGRAM (TEE);  Surgeon: Minna Merritts, MD;  Location: [facility];  Service: Cardiovascular;  Laterality: N/A;    Current Medications: No outpatient medications have been marked as taking for the 06/25/21 encounter (Appointment) with Rise Mu, PA-C.    Allergies:   Codeine   Social History   Socioeconomic History   Marital status: Divorced    Spouse name: Not on file   Number of children: Not on file   Years of education: Not on file   Highest education level: Not on file  Occupational History   Not on file  Tobacco Use   Smoking status: Every Day    Packs/day: 0.50    Years: 48.00    Pack years: 24.00    Types: Cigarettes   Smokeless tobacco: Never  Vaping Use   Vaping Use: Never used  Substance and Sexual Activity   Alcohol use: Not Currently   Drug use: Never   Sexual activity: Not on file  Other Topics Concern   Not on file  Social History Narrative   Lives locally.  Does not routinely exercise.   Social Determinants of Health   Financial Resource Strain:  Not on file  Food Insecurity: Not on file  Transportation Needs: Not on file  Physical Activity: Not on file  Stress: Not on file  Social Connections: Not on file     Family History:  The patient's family history includes COPD in her mother; Heart disease in her sister; Heart failure in her father.  ROS:   ROS   EKGs/Labs/Other Studies Reviewed:    Studies reviewed were summarized above. The additional studies were reviewed today:  Zio patch 12/2020: HR 59 - 126 bpm. 100% Atrial fibrillation burden. Rare ventricular ectopy. __________  2D echo  11/25/2020: 1. Left ventricular ejection fraction, by estimation, is 60 to 65%. The  left ventricle has normal function. The left ventricle has no regional  wall motion abnormalities. Left ventricular diastolic parameters are  indeterminate.   2. Right ventricular systolic function is normal. The right ventricular  size is normal. There is normal pulmonary artery systolic pressure. The  estimated right ventricular systolic pressure is 31.8 mmHg.   3. Left atrial size was moderately dilated.   4. The mitral valve is normal in structure. Moderate mitral valve  regurgitation. No evidence of mitral stenosis.   5. Tricuspid valve regurgitation is moderate.   6. The aortic valve is normal in structure. Aortic valve regurgitation is  not visualized. No aortic stenosis is present.   7. The inferior vena cava is normal in size with <50% respiratory  variability, suggesting right atrial pressure of 8 mmHg.   8. A small pericardial effusion is present.   9. Rhythm is atrial fibrillation  __________  TEE 10/26/2020: 1. Left ventricular ejection fraction, by estimation, is 45 to 50%. The  left ventricle has mildly decreased function. The left ventricle has no  regional wall motion abnormalities. There is mild left ventricular  hypertrophy. Left ventricular diastolic  parameters are indeterminate.   2. Right ventricular systolic function is normal. The right ventricular  size is normal. There is normal pulmonary artery systolic pressure. The  estimated right ventricular systolic pressure is 29.2 mmHg.   3. The mitral valve is normal in structure. Mild to moderate mitral valve  regurgitation. No evidence of mitral stenosis.   4. Tricuspid valve regurgitation is mild to moderate.   5. The aortic valve is normal in structure. Aortic valve regurgitation is  not visualized. Mild aortic valve sclerosis is present, with no evidence  of aortic valve stenosis.   6. The inferior vena cava is normal in size  with greater than 50%  respiratory variability, suggesting right atrial pressure of 3 mmHg.   7. Agitated saline contrast bubble study was positive with shunting  observed within 3-6 cardiac cycles suggestive of interatrial shunt. There  is a small patent foramen ovale with predominantly right to left shunting  across the atrial septum.   8. Rhythm is atrial fibrillation.   9. Left atrial size was moderately dilated. No left atrial/left atrial  appendage thrombus was detected.  10. Right atrial size was moderately dilated.  11. There is mild (Grade II) atheroma plaque involving the descending  aorta. __________  TEE 09/25/2020: 1. Left ventricular ejection fraction, by estimation, is 55 to 60%. The  left ventricle has normal function. The left ventricle has no regional  wall motion abnormalities.   2. Right ventricular systolic function is normal. The right ventricular  size is normal. There is normal pulmonary artery systolic pressure.   3. Left atrial size was mildly dilated. A left atrial/left atrial  appendage thrombus was  detected. Small mobile thrombus in mid LAA   4. Right atrial size was mildly dilated.   5. The mitral valve is normal in structure. Mild mitral valve  regurgitation. No evidence of mitral stenosis.   6. Tricuspid valve regurgitation is mild to moderate.   7. The aortic valve is tricuspid. Aortic valve regurgitation is not  visualized. No aortic stenosis is present.   8. There is Moderate (Grade III) plaque involving the descending aorta.   9. Agitated saline contrast bubble study was negative, with no evidence  of any interatrial shunt. __________  2D echo 09/22/2020: 1. Left ventricular ejection fraction, by estimation, is 60 to 65%. The  left ventricle has normal function. The left ventricle has no regional  wall motion abnormalities. Left ventricular diastolic parameters are  indeterminate. The average left  ventricular global longitudinal strain is -9.4  %. The global longitudinal  strain is abnormal.   2. Right ventricular systolic function is normal. The right ventricular  size is normal. There is mildly elevated pulmonary artery systolic  pressure. The estimated right ventricular systolic pressure is 0000000 mmHg.   3. The mitral valve is normal in structure. Mild to moderate mitral valve  regurgitation.   4. Tricuspid valve regurgitation is moderate.   5. Left atrial size was mildly dilated.   6. Rhythm is atrial fibrillation, rate 120 bpm   EKG:  EKG is ordered today.  The EKG ordered today demonstrates ***  Recent Labs: 11/16/2020: ALT 125 11/27/2020: B Natriuretic Peptide 826.7; Hemoglobin 13.2; Magnesium 2.1; Platelets 193 11/30/2020: BUN 16; Creatinine, Ser 0.90; Potassium 4.3; Sodium 141 12/25/2020: TSH 8.950  Recent Lipid Panel    Component Value Date/Time   CHOL 161 10/13/2020 0519   TRIG 68 10/13/2020 0519   HDL 54 10/13/2020 0519   CHOLHDL 3.0 10/13/2020 0519   VLDL 14 10/13/2020 0519   LDLCALC 93 10/13/2020 0519    PHYSICAL EXAM:    VS:  There were no vitals taken for this visit.  BMI: There is no height or weight on file to calculate BMI.  Physical Exam  Wt Readings from Last 3 Encounters:  02/21/21 109 lb (49.4 kg)  12/27/20 110 lb 3.2 oz (50 kg)  12/25/20 108 lb (49 kg)     ASSESSMENT & PLAN:   ***   {Are you ordering a CV Procedure (e.g. stress test, cath, DCCV, TEE, etc)?   Press F2        :[id number]     Disposition: F/u with Dr. Rockey Situ or an APP in ***, and EP as directed.   Medication Adjustments/Labs and Tests Ordered: Current medicines are reviewed at length with the patient today.  Concerns regarding medicines are outlined above. Medication changes, Labs and Tests ordered today are summarized above and listed in the Patient Instructions accessible in Encounters.   Signed, Christell Faith, PA-C 06/24/2021 11:18 AM     [address],  [place] [address] [phone]

## 2021-06-24 NOTE — ED Notes (Addendum)
RN to bedside to introduce self to pt and initiate care. Pt oxygen sat at 76% on RA. Pt was wearing a [place] and hooked up to the oxygen but it was not turned on. This RN turned on oxygen at 2 liters to see if that helped. Pt has normal pleth. 2 liters did not improve.

## 2021-06-24 NOTE — Assessment & Plan Note (Addendum)
-   Nicotine patch -- advise cessation

## 2021-06-24 NOTE — Assessment & Plan Note (Addendum)
--  Likely due to combination of COPD and CHF exacerbation.  Patient has cough, wheezing on auscultation, indicating COPD exacerbation.  Patient has trace leg edema, but she has elevated BNP 965, crackles on auscultation, vascular congestion by chest x-ray, clinically consistent with CHF exacerbation.  Patient has 4L of new oxygen requirement -Bronchodilators -IV Lasix-- change 2. Lasix 40 mg from tomorrow -Nasal cannula oxygen to maintain oxygen saturation above 93% -- patient will need home oxygen.
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
## 2021-06-24 NOTE — Assessment & Plan Note (Signed)
Eliquis 

## 2021-06-24 NOTE — Assessment & Plan Note (Signed)
Potassium at 3.1 today and magnesium was 2.2 -Repleted potassium -Continue to monitor 

## 2021-06-24 NOTE — Assessment & Plan Note (Signed)
-   Continue home medications 

## 2021-06-25 ENCOUNTER — Inpatient Hospital Stay (HOSPITAL_COMMUNITY)
Admit: 2021-06-25 | Discharge: 2021-06-25 | Disposition: A | Discharge status: Home / Self Care | Payer: Medicare HMO | Attending: Internal Medicine | Admitting: Internal Medicine

## 2021-06-25 ENCOUNTER — Ambulatory Visit: Payer: Medicare HMO | Admitting: Physician Assistant

## 2021-06-25 DIAGNOSIS — J9621 Acute and chronic respiratory failure with hypoxia: Secondary | ICD-10-CM | POA: Diagnosis not present

## 2021-06-25 DIAGNOSIS — I5031 Acute diastolic (congestive) heart failure: Secondary | ICD-10-CM

## 2021-06-25 LAB — ECHOCARDIOGRAM COMPLETE
AR max vel: 3.3 cm2
AV Area VTI: 3.14 cm2
AV Area mean vel: 2.81 cm2
AV Mean grad: 1 mmHg
AV Peak grad: 1.7 mmHg
Ao pk vel: 0.65 m/s
Area-P 1/2: 4.99 cm2
Height: 64 in
MV VTI: 2.24 cm2
S' Lateral: 2.2 cm
Weight: 1717.82 oz

## 2021-06-25 LAB — MAGNESIUM: Magnesium: 2.1 mg/dL (ref 1.7–2.4)

## 2021-06-25 LAB — BASIC METABOLIC PANEL
Anion gap: 9 (ref 5–15)
BUN: 29 mg/dL — ABNORMAL HIGH (ref 8–23)
CO2: 32 mmol/L (ref 22–32)
Calcium: 8.5 mg/dL — ABNORMAL LOW (ref 8.9–10.3)
Chloride: 100 mmol/L (ref 98–111)
Creatinine, Ser: 1.49 mg/dL — ABNORMAL HIGH (ref 0.44–1.00)
GFR, Estimated: 38 mL/min — ABNORMAL LOW (ref >60)
Glucose, Bld: 215 mg/dL — ABNORMAL HIGH (ref 70–99)
Potassium: 4 mmol/L (ref 3.5–5.1)
Sodium: 141 mmol/L (ref 135–145)

## 2021-06-25 MED ORDER — IPRATROPIUM-ALBUTEROL 0.5-2.5 (3) MG/3ML IN SOLN
3.0000 mL | Freq: Three times a day (TID) | RESPIRATORY_TRACT | Status: DC
  Administered 2021-06-25 – 2021-06-27 (×6): 3 mL via RESPIRATORY_TRACT
  Filled 2021-06-25 (×6): qty 3

## 2021-06-25 NOTE — Progress Notes (Signed)
Progress Note   Patient: Angela Singh ZOX:[id number] DOB: 03-Mar-1950 DOA: 06/24/2021     1 DOS: the patient was seen and examined on 06/25/2021   Brief hospital course: Angela Singh is a 71 y.o. 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   Patient states that she has shortness breath for more than 1 week, which has significantly worsened after she had an argument with her boyfriend at home  Assessment and Plan: * Acute on chronic respiratory failure with hypoxia (HCC) --Likely due to combination of COPD and CHF exacerbation.  Patient has cough, wheezing on auscultation, indicating COPD exacerbation.  Patient has trace leg edema, but she has elevated BNP 965, crackles on auscultation, vascular congestion by chest x-ray, clinically consistent with CHF exacerbation.  Patient has 4L of new oxygen requirement -Bronchodilators -IV Lasix -Nasal cannula oxygen to maintain oxygen saturation above 93%  COPD exacerbation (HCC) -Bronchodilators -Solu-Medrol 40 mg IV bid -chest x-ray negative for pneumonia. Pro calcitonin negative. White count normal. No indication for antibiotic. -Mucinex for cough  -Incentive spirometry -Nasal cannula oxygen as needed to maintain O2 saturation 93% or greater -- will assess for home oxygen use.  Acute on chronic diastolic CHF (congestive heart failure) (HCC) 2D echo on 11/25/2020 showed EF 60 to 65%. -Lasix 60 mg bid by IV -Daily weights -strict I/O's -Low salt diet -Fluid restriction  Persistent atrial fibrillation (HCC) -continue Eliquis, amiodarone, Cardizem, metoprolol  Thrombus of left atrial appendage -Eliquis  Essential hypertension -IV IV hydralazine as needed -Cardizem, metoprolol -Patient is on IV Lasix  Tobacco abuse - Nicotine  patch -- advise cessation  Depression - Continue home medications  Hypokalemia-resolved as of 06/25/2021 Potassium 3.0 -Repleted potassium -Check magnesium level        Subjective: patient states she feels a lot better today. No family at bedside.  Physical Exam: Vitals:   06/25/21 0818 06/25/21 0829 06/25/21 1202 06/25/21 1520  BP: 104/66 106/74 107/69   Pulse: (!) 106 (!) 101 (!) 106   Resp: 18 18 16    Temp: 98.5 F (36.9 C) (!) 97.2 F (36.2 C) 97.7 F (36.5 C)   TempSrc:   Oral   SpO2: (!) 83% 94% 95% 99%  Weight:      Height:       Physical Exam Constitutional:      Appearance: She is well-developed.  Eyes:     Extraocular Movements: Extraocular movements intact.     Pupils: Pupils are equal, round, and reactive to light.  Cardiovascular:     Rate and Rhythm: Normal rate. Rhythm irregular.  Pulmonary:     Effort: Pulmonary effort is normal.     Breath sounds: Examination of the right-upper field reveals decreased breath sounds. Examination of the left-upper field reveals decreased breath sounds. Examination of the right-middle field reveals decreased breath sounds. Examination of the left-middle field reveals decreased breath sounds. Decreased breath sounds present.  Abdominal:     General: Bowel sounds are normal.     Palpations: Abdomen is soft.  Musculoskeletal:        General: Normal range of motion.  Skin:    General: Skin is warm and dry.  Neurological:     Mental Status: She is alert.     Family Communication: none  Disposition:home in 1-2 days Status is: Inpatient Remains inpatient appropriate  because: CHF and copd flare  Planned Discharge Destination: Home    Time spent: 35 minutes  Author: Enedina Finner, MD 06/25/2021 3:36 PM  For on call review [email or web address].

## 2021-06-25 NOTE — Telephone Encounter (Signed)
Advised both meds have been taken since November on 2022, okay to refill.

## 2021-06-25 NOTE — Progress Notes (Signed)
*  PRELIMINARY RESULTS* Echocardiogram 2D Echocardiogram has been performed.  Cristela Blue 06/25/2021, 10:07 AM

## 2021-06-25 NOTE — Consult Note (Signed)
   Heart Failure Nurse Navigator Note  HFpEF 60 to 65%.  Mild mitral regurgitation.  Moderate tricuspid regurgitation from echocardiogram performed on November 25, 2020.  Echocardiogram performed on this admission results are pending at this time.  She presented to the emergency room with complaints of worsening shortness of breath for 1 week.  Comorbidities:  COPD Hypertension GERD Depression Tobacco abuse Atrial fibrillation on chronic anticoagulation Thrombus of left atrial appendage  Medications:  Amiodarone 200 mg daily Apixaban 5 mg 2 times a day Diltiazem 120 mg daily Furosemide 60 mg IV every 12 Metoprolol tartrate 75 mg 2 times a day NicoDerm 21 mg daily  Labs:  Sodium 141, potassium 4.0, chloride 100, CO2 22, BUN 29, creatinine 1.49 up from 1.19 of yesterday.  BNP on admission was 965  Intake 360 mL Output not documented Weight 4 8.7 kg Blood pressure 107/69  Initial meeting with patient on this admission.  She states she had just returned from the bathroom after needing to urinate.  There is no documented output.  Placed hat in the stool and stressed the patient that it is important we know how much she is urinating.  She states that she does not always remember to weigh herself daily.  Discussed her diet.  She states that she does not use salt but does admit to eating convenience foods as she does not feel that she has the stamina to stand at the stove and cook healthy meal.  She also states there is not always someone around that could help her with fixing meals.  Discussed restricting fluid intake to 64 ounces as she is unsure as to how much she is drinking during the day.  She states that she likes to drink Sprite or 7-Up and buys it in a 2 L bottle but is not sure about how much she is taking in.  She states that she is compliant with her medications.  She was given the Living with Heart Failure teaching booklet, info on sodium, weight chart and zone  magnet.  She has a follow up appointment in the heart failure clinic on May 26 at 1PM.  She a a 8 % no show, which is 2 out of 24 appointments.  She had no further questions.  Tresa Endo RN [facility]

## 2021-06-26 ENCOUNTER — Telehealth: Payer: Self-pay

## 2021-06-26 DIAGNOSIS — J9621 Acute and chronic respiratory failure with hypoxia: Secondary | ICD-10-CM | POA: Diagnosis not present

## 2021-06-26 MED ORDER — FUROSEMIDE 40 MG PO TABS: 40.0000 mg | ORAL_TABLET | Freq: Every day | ORAL | Status: DC

## 2021-06-26 NOTE — TOC Progression Note (Signed)
Transition of Care [facility]) - Progression Note    Patient Details  Name: Angela Singh MRN: [id number] Date of Birth: February 20, 1950  Transition of Care [facility]) CM/SW [place], LCSW Phone Number: 06/26/2021, 3:57 PM  Clinical Narrative:   Ordered home oxygen through [employer].  Expected Discharge Plan and Services                                                 Social Determinants of Health (SDOH) Interventions    Readmission Risk Interventions     View : No data to display.

## 2021-06-26 NOTE — Progress Notes (Signed)
SATURATION QUALIFICATIONS: (This note is used to comply with regulatory documentation for home oxygen)  Patient Saturations on Room Air at Rest = 90%  Patient Saturations on Room Air while Ambulating = 84%  Patient Saturations on 2 Liters of oxygen while Ambulating = 95%  Please briefly explain why patient needs home oxygen: desats without o2 with activity

## 2021-06-26 NOTE — Progress Notes (Signed)
Progress Note   Patient: Angela Singh DQQ:[id number] DOB: [birth year] DOA: 06/24/2021     2 DOS: the patient was seen and examined on 06/26/2021   Brief hospital course:  Angela Singh is a 71 y.o. 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   Patient states that she has shortness breath for more than 1 week, which has significantly worsened after she had an argument with her boyfriend at home   Assessment and Plan: * Acute on chronic respiratory failure with hypoxia (HCC) --Likely due to combination of COPD and CHF exacerbation.  Patient has cough, wheezing on auscultation, indicating COPD exacerbation.  Patient has trace leg edema, but she has elevated BNP 965, crackles on auscultation, vascular congestion by chest x-ray, clinically consistent with CHF exacerbation.  Patient has 4L of new oxygen requirement -Bronchodilators -IV Lasix-- change 2. Lasix 40 mg from tomorrow -Nasal cannula oxygen to maintain oxygen saturation above 93% -- patient will need home oxygen.   COPD exacerbation (HCC) -Bronchodilators -Solu-Medrol 40 mg IV bid-- change to oral taper from tomorrow -chest x-ray negative for pneumonia. Pro calcitonin negative. White count normal. No indication for antibiotic. -Mucinex for cough  -Incentive spirometry -Nasal cannula oxygen as needed to maintain O2 saturation 93% or greater -- will assess for home oxygen use.  Acute on chronic diastolic CHF (congestive heart failure) (HCC) 2D echo on 11/25/2020 showed EF 60 to 65%. -Lasix 60 mg bid by IV-- change to oral Lasix 40 mg daily from [place] -Daily weights -strict I/O's -Low salt diet -Fluid restriction  Persistent atrial fibrillation (HCC) -continue Eliquis, amiodarone, Cardizem, metoprolol  Thrombus of left  atrial appendage -Eliquis  Essential hypertension -IV IV hydralazine as needed -Cardizem, metoprolol -Patient is on IV Lasix  Tobacco abuse - Nicotine patch -- advise cessation  Depression - Continue home medications  Hypokalemia-resolved as of 06/25/2021 Potassium 3.0 -Repleted potassium -Check magnesium level        Subjective: overall improving slowly. She wants to stay another day.  Physical Exam: Vitals:   06/26/21 0730 06/26/21 1207 06/26/21 1234 06/26/21 1510  BP: 107/77  97/67   Pulse: 92  93   Resp: 18  18   Temp: 98.1 F (36.7 C)  98.2 F (36.8 C)   TempSrc:      SpO2: 91% 94% 95%   Weight:    48.4 kg  Height:       Physical Exam Constitutional:      Appearance: She is well-developed.  Eyes:     Extraocular Movements: Extraocular movements intact.     Pupils: Pupils are equal, round, and reactive to light.  Cardiovascular:     Rate and Rhythm: Normal rate. Rhythm irregular.  Pulmonary:     Effort: Pulmonary effort is normal.     Breath sounds: Examination of the right-upper field reveals decreased breath sounds. Examination of the left-upper field reveals decreased breath sounds. Examination of the right-middle field reveals decreased breath sounds. Examination of the left-middle field reveals decreased breath sounds. Decreased breath sounds present.  Abdominal:     General: Bowel sounds are normal.     Palpations: Abdomen is soft.    Family Communication: none today  Disposition: Status is: Inpatient Remains inpatient appropriate because: COPD/CHF flare  Planned Discharge Destination: Home and Home with Home Health   anticipate discharge likely  5/24 with oxygen Time spent: 35 minutes  Author: Enedina Finner, MD 06/26/2021 3:11 PM  For on call review [email or web address].

## 2021-06-26 NOTE — Telephone Encounter (Signed)
-----   Message from Angela Singh sent at 06/21/2021  2:01 PM EDT ----- Regarding: needs appt Please reschedule F/U appt with Dr. Lalla Brothers for refills of amiodarone. Patient has canceled last 2 office visits. Thank you!

## 2021-06-26 NOTE — Telephone Encounter (Signed)
LVm to schedule follow up appointment

## 2021-06-27 ENCOUNTER — Encounter: Payer: Medicare HMO | Admitting: Cardiology

## 2021-06-27 DIAGNOSIS — J9621 Acute and chronic respiratory failure with hypoxia: Secondary | ICD-10-CM | POA: Diagnosis not present

## 2021-06-27 LAB — CBC
HCT: 43 % (ref 36.0–46.0)
Hemoglobin: 13.9 g/dL (ref 12.0–15.0)
MCH: 33.2 pg (ref 26.0–34.0)
MCHC: 32.3 g/dL (ref 30.0–36.0)
MCV: 102.6 fL — ABNORMAL HIGH (ref 80.0–100.0)
Platelets: 332 10*3/uL (ref 150–400)
RBC: 4.19 MIL/uL (ref 3.87–5.11)
RDW: 13.4 % (ref 11.5–15.5)
WBC: 14.2 10*3/uL — ABNORMAL HIGH (ref 4.0–10.5)
nRBC: 0 % (ref 0.0–0.2)

## 2021-06-27 LAB — BASIC METABOLIC PANEL
Anion gap: 10 (ref 5–15)
BUN: 28 mg/dL — ABNORMAL HIGH (ref 8–23)
CO2: 39 mmol/L — ABNORMAL HIGH (ref 22–32)
Calcium: 9 mg/dL (ref 8.9–10.3)
Chloride: 90 mmol/L — ABNORMAL LOW (ref 98–111)
Creatinine, Ser: 0.92 mg/dL (ref 0.44–1.00)
GFR, Estimated: >60 mL/min (ref >60)
Glucose, Bld: 115 mg/dL — ABNORMAL HIGH (ref 70–99)
Potassium: 4 mmol/L (ref 3.5–5.1)
Sodium: 139 mmol/L (ref 135–145)

## 2021-06-27 LAB — MAGNESIUM: Magnesium: 2.2 mg/dL (ref 1.7–2.4)

## 2021-06-27 MED ORDER — PREDNISONE 20 MG PO TABS: 40.0000 mg | ORAL_TABLET | Freq: Every day | ORAL | Status: DC

## 2021-06-27 MED ORDER — NICOTINE 21 MG/24HR TD PT24: 21.0000 mg | MEDICATED_PATCH | Freq: Every day | TRANSDERMAL | 0 refills | Status: DC

## 2021-06-27 MED ORDER — PREDNISONE 20 MG PO TABS: 40.0000 mg | ORAL_TABLET | Freq: Every day | ORAL | 0 refills | Status: AC

## 2021-06-27 MED ORDER — IPRATROPIUM-ALBUTEROL 0.5-2.5 (3) MG/3ML IN SOLN: 3.0000 mL | Freq: Two times a day (BID) | RESPIRATORY_TRACT | Status: DC

## 2021-06-27 NOTE — Discharge Summary (Signed)
Physician Discharge Summary   Angela Singh  female DOB: 1950-08-08  [id number]  PCP: Letta Median, MD  Admit date: 06/24/2021 Discharge date: 06/27/2021  Admitted From: home Disposition:  home CODE STATUS: Full code  Discharge Instructions     Diet - low sodium heart healthy   Complete by: As directed       Hospital Course:  For full details, please see H&P, progress notes, consult notes and ancillary notes.  Briefly,  Angela Singh is a 71 y.o. 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  * Acute on chronic respiratory failure with hypoxia (HCC) --Likely due to combination of COPD and CHF exacerbation.  Patient has cough, wheezing on auscultation, indicating COPD exacerbation.  Patient has trace leg edema, but she has elevated BNP 965, crackles on auscultation, vascular congestion by chest x-ray, clinically consistent with CHF exacerbation.   --pt needed up to 4L O2.  Prior to discharge, pt still needed 2L O2 with ambulation, so was discharge on home 2L O2, to be weaned off by outpatient provider.  COPD exacerbation ([place]) -chest x-ray negative for pneumonia. Pro calcitonin negative. White count normal. No indication for antibiotic. --received 3 days of IV solumedrol, discharged on 2 more days of prednisone. --received scheduled DuoNeb while inpatient, discharged on home Symbicort.   Acute on chronic diastolic CHF (congestive heart failure) ([place]) 2D echo on 11/25/2020 showed EF 60 to 65%. -Received IV Lasix 60 mg bid during hospitalization.  Discharged back on home oral lasix.   Persistent atrial fibrillation (HCC) -continue home amiodarone, Cardizem, metoprolol --cont home Eliquis   Thrombus of left atrial appendage --cont home Eliquis   Essential  hypertension -cont home Cardizem, metoprolol --cont lasix   Tobacco abuse -- advised cessation - Nicotine patch   Depression - Continue home Prozac   Hypokalemia --repleted   Discharge Diagnoses:  Principal Problem:   Acute on chronic respiratory failure with hypoxia (HCC) Active Problems:   COPD exacerbation (HCC)   Acute on chronic diastolic CHF (congestive heart failure) (HCC)   Persistent atrial fibrillation (HCC)   Thrombus of left atrial appendage   Essential hypertension   Tobacco abuse   30 Day Unplanned Readmission Risk Score    Flowsheet Row ED to Hosp-Admission (Current) from 06/24/2021 in [place] [facility]  30 Day Unplanned Readmission Risk Score (%) 19.64 Filed at 06/27/2021 0801       This score is the patient's risk of an unplanned readmission within 30 days of being discharged (0 -100%). The score is based on dignosis, age, lab data, medications, orders, and past utilization.   Low:  0-14.9   Medium: 15-21.9   High: 22-29.9   Extreme: 30 and above         Discharge Instructions:  Allergies as of 06/27/2021       Reactions   Codeine Other (See Comments)   weakness        Medication List     STOP taking these medications    potassium chloride SA 20 MEQ tablet Commonly known as: KLOR-CON M       TAKE these medications    albuterol 108 (90 Base) MCG/ACT inhaler Commonly known as: VENTOLIN HFA Inhale 2 puffs into the lungs every 4 (four) hours as needed.   amiodarone 200 MG tablet Commonly known  as: PACERONE Take 1 tablet (200 mg total) by mouth daily. PLEASE SCHEDULE F/U APPOINTMENT WITH DR. Quentin Ore FOR FURTHER REFILLS. THANK YOU!   apixaban 5 MG Tabs tablet Commonly known as: ELIQUIS Take 1 tablet (5 mg total) by mouth 2 (two) times daily.   ascorbic acid 500 MG tablet Commonly known as: VITAMIN C Take 1,000 mg by mouth 2 (two) times daily.   budesonide-formoterol 160-4.5 MCG/ACT inhaler Commonly known as:  SYMBICORT Inhale 1 puff into the lungs 2 (two) times daily in the am and at bedtime.Marland Kitchen   diltiazem 120 MG 24 hr capsule Commonly known as: CARDIZEM CD Take 120 mg by mouth daily. What changed: Another medication with the same name was removed. Continue taking this medication, and follow the directions you see here.   FLUoxetine 40 MG capsule Commonly known as: PROZAC Take 40 mg by mouth daily.   furosemide 40 MG tablet Commonly known as: LASIX Take 1.5 tablets (60 mg total) by mouth daily. May take additional 40 mg for swelling or shortness of breath   Metoprolol Tartrate 75 MG Tabs Take 75 mg by mouth 2 (two) times daily. What changed: Another medication with the same name was removed. Continue taking this medication, and follow the directions you see here.   nicotine 21 mg/24hr patch Commonly known as: NICODERM CQ - dosed in mg/24 hours Place 1 patch (21 mg total) onto the skin daily. Start taking on: Jun 28, 2021   ONE-A-DAY WOMENS 50+ PO Take 1 tablet by mouth daily.   pantoprazole 40 MG tablet Commonly known as: PROTONIX Take 40 mg by mouth daily.   predniSONE 20 MG tablet Commonly known as: DELTASONE Take 2 tablets (40 mg total) by mouth daily with breakfast for 2 days. Start taking on: Jun 28, 2021   vitamin E 180 MG (400 UNITS) capsule Take 400 Units by mouth 2 (two) times daily.               Durable Medical Equipment  (From admission, onward)           Start     Ordered   06/26/21 1509  For home use only DME oxygen  Once       Question Answer Comment  Length of Need Lifetime   Mode or (Route) Nasal cannula   Liters per Minute 2   Frequency Continuous (stationary and portable oxygen unit needed)   Oxygen conserving device Yes   Oxygen delivery system Gas      06/26/21 1508             Follow-up Information     Bender, Durene Cal, MD Follow up in 1 week(s).   Specialty: Family Medicine Contact information: [place] [id number] [phone]                 Allergies  Allergen Reactions   Codeine Other (See Comments)    weakness     The results of significant diagnostics from this hospitalization (including imaging, microbiology, ancillary and laboratory) are listed below for reference.   Consultations:   Procedures/Studies: DG Chest Port 1 View  Result Date: 06/24/2021 CLINICAL DATA:  Shortness of breath. EXAM: PORTABLE CHEST 1 VIEW COMPARISON:  Chest radiograph dated 11/25/2020. FINDINGS: The heart size and mediastinal contours are within normal limits. Vascular calcifications are seen in the aortic arch. Mild diffuse bilateral interstitial prominence may represent vascular congestion. No focal consolidation, pleural effusion, or pneumothorax. Degenerative changes are seen in  the spine. IMPRESSION: Mild diffuse bilateral interstitial prominence may represent vascular congestion. Aortic Atherosclerosis (ICD10-I70.0). Electronically Signed   By: Romona Curlsyler  Litton M.D.   On: 06/24/2021 12:25   ECHOCARDIOGRAM COMPLETE  Result Date: 06/25/2021    ECHOCARDIOGRAM REPORT   Patient Name:   Angela Singh Date of Exam: 06/25/2021 Medical Rec #:  [id number]      Height:       64.0 in Accession #:    [id number]     Weight:       107.4 lb Date of Birth:  July 21, 1950      BSA:          1.502 m Patient Age:    70 years       BP:           106/74 mmHg Patient Gender: F              HR:           101 bpm. Exam Location:  [facility] Procedure: 2D Echo, Cardiac Doppler and Color Doppler Indications:     CHF-acute diastolic I50.31  History:         Patient has prior history of Echocardiogram examinations, most                  recent 11/25/2020. COPD; Risk Factors:Hypertension. Mitral                  regurgitation, tricuspid regurgitation, persistent Afib,                  tobacco abuse.  Sonographer:     Cristela BlueJerry Hege Referring Phys:  [id number] Brien FewXILIN NIU Diagnosing Phys: Lorine BearsMuhammad Arida MD  Sonographer Comments:  Suboptimal apical window. IMPRESSIONS  1. Left ventricular ejection fraction, by estimation, is 60 to 65%. The left ventricle has normal function. The left ventricle has no regional wall motion abnormalities. There is mild left ventricular hypertrophy. Left ventricular diastolic parameters are indeterminate.  2. Right ventricular systolic function is normal. The right ventricular size is normal. There is normal pulmonary artery systolic pressure.  3. Left atrial size was mild to moderately dilated.  4. Right atrial size was mildly dilated.  5. The mitral valve is normal in structure. Moderate mitral valve regurgitation. No evidence of mitral stenosis.  6. Tricuspid valve regurgitation is moderate.  7. The aortic valve is normal in structure. Aortic valve regurgitation is not visualized. Aortic valve sclerosis is present, with no evidence of aortic valve stenosis.  8. The inferior vena cava is dilated in size with >50% respiratory variability, suggesting right atrial pressure of 8 mmHg. FINDINGS  Left Ventricle: Left ventricular ejection fraction, by estimation, is 60 to 65%. The left ventricle has normal function. The left ventricle has no regional wall motion abnormalities. The left ventricular internal cavity size was normal in size. There is  mild left ventricular hypertrophy. Left ventricular diastolic parameters are indeterminate. Right Ventricle: The right ventricular size is normal. No increase in right ventricular wall thickness. Right ventricular systolic function is normal. There is normal pulmonary artery systolic pressure. The tricuspid regurgitant velocity is 2.58 m/s, and  with an assumed right atrial pressure of 8 mmHg, the estimated right ventricular systolic pressure is 34.6 mmHg. Left Atrium: Left atrial size was mild to moderately dilated. Right Atrium: Right atrial size was mildly dilated. Pericardium: There is no evidence of pericardial effusion. Mitral Valve: The mitral valve is normal in  structure. Moderate mitral valve regurgitation. No evidence of  mitral valve stenosis. MV peak gradient, 5.3 mmHg. The mean mitral valve gradient is 3.0 mmHg. Tricuspid Valve: The tricuspid valve is normal in structure. Tricuspid valve regurgitation is moderate . No evidence of tricuspid stenosis. Aortic Valve: The aortic valve is normal in structure. Aortic valve regurgitation is not visualized. Aortic valve sclerosis is present, with no evidence of aortic valve stenosis. Aortic valve mean gradient measures 1.0 mmHg. Aortic valve peak gradient measures 1.7 mmHg. Aortic valve area, by VTI measures 3.14 cm. Pulmonic Valve: The pulmonic valve was normal in structure. Pulmonic valve regurgitation is not visualized. No evidence of pulmonic stenosis. Aorta: The aortic root is normal in size and structure. Venous: The inferior vena cava is dilated in size with greater than 50% respiratory variability, suggesting right atrial pressure of 8 mmHg. IAS/Shunts: No atrial level shunt detected by color flow Doppler.  LEFT VENTRICLE PLAX 2D LVIDd:         3.90 cm LVIDs:         2.20 cm LV PW:         1.00 cm LV IVS:        0.85 cm LVOT diam:     2.00 cm LV SV:         36 LV SV Index:   24 LVOT Area:     3.14 cm  RIGHT VENTRICLE RV Basal diam:  4.00 cm RV S prime:     9.68 cm/s TAPSE (M-mode): 2.0 cm LEFT ATRIUM             Index        RIGHT ATRIUM           Index LA diam:        4.70 cm 3.13 cm/m   RA Area:     15.70 cm LA Vol (A2C):   56.9 ml 37.89 ml/m  RA Volume:   41.80 ml  27.83 ml/m LA Vol (A4C):   54.9 ml 36.55 ml/m LA Biplane Vol: 59.0 ml 39.28 ml/m  AORTIC VALVE                    PULMONIC VALVE AV Area (Vmax):    3.30 cm     PV Vmax:        0.82 m/s AV Area (Vmean):   2.81 cm     PV Vmean:       53.700 cm/s AV Area (VTI):     3.14 cm     PV VTI:         0.149 m AV Vmax:           64.60 cm/s   PV Peak grad:   2.7 mmHg AV Vmean:          45.600 cm/s  PV Mean grad:   1.0 mmHg AV VTI:            0.115 m      RVOT  Peak grad: 3 mmHg AV Peak Grad:      1.7 mmHg AV Mean Grad:      1.0 mmHg LVOT Vmax:         67.90 cm/s LVOT Vmean:        40.800 cm/s LVOT VTI:          0.115 m LVOT/AV VTI ratio: 1.00  AORTA Ao Root diam: 2.70 cm MITRAL VALVE               TRICUSPID VALVE MV Area (PHT): 4.99 cm  TR Peak grad:   26.6 mmHg MV Area VTI:   2.24 cm    TR Vmax:        258.00 cm/s MV Peak grad:  5.3 mmHg MV Mean grad:  3.0 mmHg    SHUNTS MV Vmax:       1.15 m/s    Systemic VTI:  0.12 m MV Vmean:      80.6 cm/s   Systemic Diam: 2.00 cm MV Decel Time: 152 msec    Pulmonic VTI:  0.156 m MV E velocity: 79.70 cm/s Kathlyn Sacramento MD Electronically signed by Kathlyn Sacramento MD Signature Date/Time: 06/25/2021/12:58:46 PM    Final       Labs: BNP (last 3 results) Recent Labs    11/25/20 0340 11/27/20 0529 06/24/21 1247  BNP 973.1* 826.7* [id number]*   Basic Metabolic Panel: Recent Labs  Lab 06/24/21 1254 06/24/21 1534 06/25/21 0329 06/27/21 0838  NA 140  --  141 139  K 3.0*  --  4.0 4.0  CL 95*  --  100 90*  CO2 35*  --  32 39*  GLUCOSE 102*  --  215* 115*  BUN 19  --  29* 28*  CREATININE 1.19*  --  1.49* 0.92  CALCIUM 8.2*  --  8.5* 9.0  MG  --  1.8 2.1 2.2   Liver Function Tests: No results for input(s): AST, ALT, ALKPHOS, BILITOT, PROT, ALBUMIN in the last 168 hours. No results for input(s): LIPASE, AMYLASE in the last 168 hours. No results for input(s): AMMONIA in the last 168 hours. CBC: Recent Labs  Lab 06/24/21 1254 06/27/21 0838  WBC 8.6 14.2*  HGB 12.3 13.9  HCT 39.1 43.0  MCV 103.2* 102.6*  PLT 262 332   Cardiac Enzymes: No results for input(s): CKTOTAL, CKMB, CKMBINDEX, TROPONINI in the last 168 hours. BNP: Invalid input(s): POCBNP CBG: No results for input(s): GLUCAP in the last 168 hours. D-Dimer No results for input(s): DDIMER in the last 72 hours. Hgb A1c No results for input(s): HGBA1C in the last 72 hours. Lipid Profile No results for input(s): CHOL, HDL, LDLCALC, TRIG,  CHOLHDL, LDLDIRECT in the last 72 hours. Thyroid function studies No results for input(s): TSH, T4TOTAL, T3FREE, THYROIDAB in the last 72 hours.  Invalid input(s): FREET3 Anemia work up No results for input(s): VITAMINB12, FOLATE, FERRITIN, TIBC, IRON, RETICCTPCT in the last 72 hours. Urinalysis No results found for: COLORURINE, APPEARANCEUR, [place], [place], GLUCOSEU, [place], [place], [place], PROTEINUR, UROBILINOGEN, NITRITE, LEUKOCYTESUR Sepsis Labs Invalid input(s): PROCALCITONIN,  WBC,  LACTICIDVEN Microbiology Recent Results (from the past 240 hour(s))  Resp Panel by RT-PCR (Flu A&B, Covid) Nasopharyngeal Swab     Status: None   Collection Time: 06/24/21  3:19 PM   Specimen: Nasopharyngeal Swab; Nasopharyngeal(NP) swabs in vial transport medium  Result Value Ref Range Status   SARS Coronavirus 2 by RT PCR NEGATIVE NEGATIVE Final    Comment: (NOTE) SARS-CoV-2 target nucleic acids are NOT DETECTED.  The SARS-CoV-2 RNA is generally detectable in upper respiratory specimens during the acute phase of infection. The lowest concentration of SARS-CoV-2 viral copies this assay can detect is 138 copies/mL. A negative result does not preclude SARS-Cov-2 infection and should not be used as the sole basis for treatment or other patient management decisions. A negative result may occur with  improper specimen collection/handling, submission of specimen other than nasopharyngeal swab, presence of viral mutation(s) within the areas targeted by this assay, and inadequate number of viral copies(<138 copies/mL). A negative result  must be combined with clinical observations, patient history, and epidemiological information. The expected result is Negative.  Fact Sheet for Patients:  EntrepreneurPulse.com.au  Fact Sheet for Healthcare Providers:  IncredibleEmployment.be  This test is no t yet approved or cleared by the [place] FDA and  has been  authorized for detection and/or diagnosis of SARS-CoV-2 by FDA under an Emergency Use Authorization (EUA). This EUA will remain  in effect (meaning this test can be used) for the duration of the COVID-19 declaration under Section 564(b)(1) of the Act, 21 U.S.C.section 360bbb-3(b)(1), unless the authorization is terminated  or revoked sooner.       Influenza A by PCR NEGATIVE NEGATIVE Final   Influenza B by PCR NEGATIVE NEGATIVE Final    Comment: (NOTE) The Xpert Xpress SARS-CoV-2/FLU/RSV plus assay is intended as an aid in the diagnosis of influenza from Nasopharyngeal swab specimens and should not be used as a sole basis for treatment. Nasal washings and aspirates are unacceptable for Xpert Xpress SARS-CoV-2/FLU/RSV testing.  Fact Sheet for Patients: EntrepreneurPulse.com.au  Fact Sheet for Healthcare Providers: IncredibleEmployment.be  This test is not yet approved or cleared by the [place] FDA and has been authorized for detection and/or diagnosis of SARS-CoV-2 by FDA under an Emergency Use Authorization (EUA). This EUA will remain in effect (meaning this test can be used) for the duration of the COVID-19 declaration under Section 564(b)(1) of the Act, 21 U.S.C. section 360bbb-3(b)(1), unless the authorization is terminated or revoked.  Performed at [facility], [address] [place], [place] [address]      Total time spend on discharging this patient, including the last patient exam, discussing the hospital stay, instructions for ongoing care as it relates to all pertinent caregivers, as well as preparing the medical discharge records, prescriptions, and/or referrals as applicable, is 35 minutes.    Enzo Bi, MD  [facility] 06/27/2021, 10:59 AM

## 2021-06-27 NOTE — Progress Notes (Addendum)
   Heart Failure Nurse Navigator Note  Patient states that she has a scale and was weighing daily prior to admission.  She states at home she was compliant with the lasix and usually took it at 6 AM and then again at 6 PM.  Discussed moving the second dose to 2 PM so she was not up all night having to urinate.  She voices understanding.  By teach back method went over daily weights, changes in symptoms, what to report to physician.  Needing very little re-enforcement.  She is being discharged today.  Tresa Endo RN [facility]

## 2021-06-27 NOTE — Care Management Important Message (Signed)
Important Message  Patient Details  Name: Angela Singh MRN: [id number] Date of Birth: [birth year]   Medicare Important Message Given:  Yes     [name] 06/27/2021, 11:48 AM

## 2021-06-27 NOTE — Progress Notes (Signed)
Patient refused IV Lasix, educated about importance of following treatment plan but patient still refuses

## 2021-06-27 NOTE — Progress Notes (Signed)
Patient given discharge teaching. IV and tele d/c and off. IV catheter intact. Patient received her O2 and is at beside. No concerns voiced at this time.

## 2021-06-27 NOTE — TOC Transition Note (Addendum)
Transition of Care [facility]) - CM/SW Discharge Note   Patient Details  Name: Angela Singh MRN: [id number] Date of Birth: 1950-12-23  [facility]) CM/SW Contact:  Alberteen Sam, LCSW Phone Number: 06/27/2021, 10:44 AM   Clinical Narrative:     Patient to discharge to sons home today, oxygen has been ordered 5/23 and delivered today 5/24.   CSW spoke with patient directly who confirmed her discharge plan is to go home with her son and states her son and daughter in law will be picking her up at discharge today. States that she will call them when ready to discharge. RN made aware.   No further discharge needs identified.   Final next level of care: Home/Self Care Barriers to Discharge: No Barriers Identified   Patient Goals and CMS Choice Patient states their goals for this hospitalization and ongoing recovery are:: to go home CMS Medicare.gov Compare Post Acute Care list provided to:: Patient Choice offered to / list presented to : Patient  Discharge Placement                       Discharge Plan and Services                                     Social Determinants of Health (SDOH) Interventions     Readmission Risk Interventions     View : No data to display.

## 2021-06-29 ENCOUNTER — Ambulatory Visit: Payer: Medicare HMO | Admitting: Family

## 2021-06-29 ENCOUNTER — Telehealth: Payer: Self-pay | Admitting: Family

## 2021-06-29 NOTE — Telephone Encounter (Signed)
Patient did not show for her Heart Failure [facility] appointment on 06/29/21. Will attempt to reschedule.   

## 2021-06-29 NOTE — Progress Notes (Unsigned)
Patient ID: Angela Singh, female    DOB: 1950-07-18, 71 y.o.   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    Echo report from 06/25/21 reviewed and showed an EF of 60-65% along with mild LVH, mild LAE and moderate MR/TR. Echo report from 10/26/20 reviewed and showed an EF of 45-50% along with mild LVH and mild/moderate MR/TR.   Admitted 06/24/21 due to shortness of breath due to COPD/HF exacerbation. Initially needed 4L oxygen and was weaned down to 2L. Initially given IV lasix with transition to oral diuretics. Given IV solumedrol with a few more days of oral prednisone. Discharged after 3 days.   She presents today for a follow up with a chief complaint of   Past Medical History:  Diagnosis Date   Chronic combined systolic (congestive) and diastolic (congestive) heart failure (HCC)    a. 09/2020 Echo: EF 60-65%. No rwma. Nl RV fxn. RVSP 38.70mmHg. Mild to mod MR. Mod TR; b. 10/2020 TEE: EF 45-50%, no rwma, nl RV fxn, RVSP 29.60mmHg, mild to mod MR/TR, Ao sclerosis, small PFO w/ R->L shunting. Mod dil LA.   COPD (chronic obstructive pulmonary disease) (HCC)    Essential hypertension    GERD (gastroesophageal reflux disease)    Mitral regurgitation    Mixed hyperlipidemia    Persistent atrial fibrillation (HCC)    a. Dx 09/2020. CHA2DS2VASc = 3-->Eliquis; b. 10/26/2020 TEE/DCCV: No LA thrombus-->successful DCCV; c. 10/2020 Recurrent Afib-->amio load-->DCCV-->recurrent afib.   Thrombus of left atrial appendage    a. 09/2020 noted on TEE in setting of Afib-->eliquis; b. 10/2020 TEE: no LA thrombus.   Tobacco abuse    Tricuspid regurgitation    Past Surgical History:  Procedure Laterality Date   CARDIOVERSION N/A 10/26/2020   Procedure: CARDIOVERSION;  Surgeon: Antonieta Iba, MD;  Location: [facility];  Service: Cardiovascular;   Laterality: N/A;   CARDIOVERSION N/A 11/01/2020   Procedure: CARDIOVERSION;  Surgeon: Debbe Odea, MD;  Location: [facility];  Service: Cardiovascular;  Laterality: N/A;   CARDIOVERSION N/A 11/23/2020   Procedure: CARDIOVERSION;  Surgeon: Antonieta Iba, MD;  Location: [facility];  Service: Cardiovascular;  Laterality: N/A;   TEE WITHOUT CARDIOVERSION N/A 09/25/2020   Procedure: TRANSESOPHAGEAL ECHOCARDIOGRAM (TEE);  Surgeon: Iran Ouch, MD;  Location: [facility];  Service: Cardiovascular;  Laterality: N/A;   TEE WITHOUT CARDIOVERSION N/A 10/26/2020   Procedure: TRANSESOPHAGEAL ECHOCARDIOGRAM (TEE);  Surgeon: Antonieta Iba, MD;  Location: [facility];  Service: Cardiovascular;  Laterality: N/A;   Family History  Problem Relation Age of Onset   COPD Mother    Heart failure Father    Heart disease Sister    Social History   Tobacco Use   Smoking status: Every Day    Packs/day: 0.50    Years: 48.00    Pack years: 24.00    Types: Cigarettes   Smokeless tobacco: Never  Substance Use Topics   Alcohol use: Not Currently   Allergies  Allergen Reactions   Codeine Other (See Comments)    weakness     Review of Systems  Constitutional:  Positive for fatigue (easily). Negative for appetite change.  HENT:         Tongue burns with eating   Eyes: Negative.   Cardiovascular:  Negative for palpitations.  Gastrointestinal:  Negative for abdominal distention.  Endocrine:  Negative.   Genitourinary: Negative.   Musculoskeletal:  Negative for back pain.  Skin: Negative.   Allergic/Immunologic: Negative.   Neurological:  Negative for dizziness and light-headedness.  Hematological:  Negative for adenopathy. Does not bruise/bleed easily.  Psychiatric/Behavioral:  Positive for dysphoric mood (affect flat) and hallucinations. Negative for sleep disturbance (sleeping on 1 pillow). The patient is not nervous/anxious.      Physical Exam Vitals and nursing note reviewed.   Constitutional:      General: She is not in acute distress.    Appearance: Normal appearance.  HENT:     Head: Normocephalic and atraumatic.  Neck:     Vascular: No JVD.  Cardiovascular:     Rate and Rhythm: Normal rate. Rhythm irregular.  Pulmonary:     Effort: Pulmonary effort is normal. No respiratory distress.     Breath sounds: No wheezing or rales.  Abdominal:     General: Abdomen is flat. There is no distension.     Palpations: Abdomen is soft.  Musculoskeletal:        General: No tenderness.     Cervical back: Normal range of motion and neck supple.     Right lower leg: No edema.     Left lower leg: No edema.  Skin:    General: Skin is warm and dry.  Neurological:     General: No focal deficit present.     Mental Status: She is alert and oriented to person, place, and time.  Psychiatric:        Mood and Affect: Mood normal.        Behavior: Behavior normal.        Thought Content: Thought content normal.    Assessment & Plan:  1: Chronic heart failure with preserved ejection fraction with structural changes (LVH/LAE)- - NYHA class III - euvolemic today - weighing daily; instructed to call for an overnight weight gain of > 2 pounds or a weekly weight gain of > 5 pounds - weight 108 pounds from last visit here 6 months ago - not adding salt and has been reading food labels for sodium content; reviewed the importance of trying to keep daily sodium intake to < 2000mg  - on GDMT of metoprolol (although tartrate) - BNP 11/27/20 was 826.7   2: HTN- - BP  - sees PCP (Bender) - BMP 11/30/20 reviewed and showed sodium 141, potassium 4.3, creatinine 0.9 and GFR >60  3: Atrial fibrillation- - saw cardiology 12/02/20) 12/05/20 - saw EP 13/1/22) 02/21/21  4: COPD- - smoking 1/2 ppd of cigarettes  - has quit in the past  - complete cessation discussed for 3 minutes with her

## 2021-09-05 ENCOUNTER — Other Ambulatory Visit: Payer: Self-pay

## 2021-09-05 DIAGNOSIS — I4891 Unspecified atrial fibrillation: Secondary | ICD-10-CM

## 2021-09-05 MED ORDER — APIXABAN 5 MG PO TABS: 5.0000 mg | ORAL_TABLET | Freq: Two times a day (BID) | ORAL | 1 refills | Status: DC

## 2021-09-05 NOTE — Telephone Encounter (Signed)
Prescription refill request for Eliquis received. Indication: Afib  Last office visit: 02/21/21 Lalla Brothers)  Scr: 0.92 (06/27/21)  Age: 71 Weight: 50.4kg   Appropriate dose and refill sent to requested pharmacy.

## 2021-10-03 ENCOUNTER — Other Ambulatory Visit: Payer: Self-pay | Admitting: Family Medicine

## 2021-10-03 DIAGNOSIS — Z1231 Encounter for screening mammogram for malignant neoplasm of breast: Secondary | ICD-10-CM

## 2021-10-22 NOTE — Progress Notes (Unsigned)
Cardiology Office Note  Date:  10/23/2021   ID:  Angela Singh, DOB 1951/02/03, MRN [id number]  PCP:  Letta Median, MD   Chief Complaint  Patient presents with   6 month follow up     Patient c/o shortness of breath and facial and abdominal swelling. Medications reviewed by the patient verblaly.     HPI:  71 year old female with history of  Persistent atrial fibrillation/with RVR hypertension,  hyperlipidemia,  tobacco abuse, COPD GERD,  who presents for follow-up related to recurrent atrial fibrillation and heart failure with midrange ejection fraction (EF 45-50% September 2022).  Last seen in clinic by myself November 2022 Seen by EP January 2023 At the time it was recommended she have pacemaker followed by AV node ablation She canceled the procedure, on further discussion today reports that she did not want any procedures She reports that she currently feels well, feels asymptomatic from her atrial fibrillation Currently for rate control she is taking amiodarone, metoprolol tartrate 50 twice daily Diltiazem SR 60 was written by primary care but she has not started this yet  Legs weak, can't walk too far, sedentary "Heart feels ok"  Pants feel tight, Weight higher, 13 pounds since 02/2021 Denies leg swelling  EKG personally reviewed by myself on todays visit Atrial fibrillation rate 84 with nonspecific ST abnormality  Other past medical history reviewed Prior history of difficult to control atrial fibrillation secondary to RVR, multiple hospitalizations, Also admissions for diastolic CHF in the setting of atrial fibrillation  August 2022, in the hospital A. Fib RVR.  controlled on IV diltiazem, IV amiodarone, IV digoxin, and oral beta-blocker.  Echo showed an EF of 60 to 65% with mild to moderate mitral regurgitation.  TEE on August 22 left atrial appendage thrombus.   September 7 showed a digoxin level of 2.7  sent to the hospital  in A. Fib with a slow  response in the 50s. hypotensive.  Hypokalemic  October 23 2020, she complained of fatigue.  in A. Fib with RVR.   TEE and cardioversion,  cardioversion was successful.   September 25 with recurrent A. Fib with RVR and heart failure.  In the hospital cardioversion on September 28 reverted back to atrial fibrillation by September 29.    cardioversion November 23, 2020 Normal sinus rhythm restored, Follow-up in the hospital November 25, 2020, back in atrial fibrillation  PMH:   has a past medical history of Chronic combined systolic (congestive) and diastolic (congestive) heart failure ([place]), COPD (chronic obstructive pulmonary disease) ([place]), Essential hypertension, GERD (gastroesophageal reflux disease), Mitral regurgitation, Mixed hyperlipidemia, Persistent atrial fibrillation ([place]), Thrombus of left atrial appendage, Tobacco abuse, and Tricuspid regurgitation.  PSH:    Past Surgical History:  Procedure Laterality Date   CARDIOVERSION N/A 10/26/2020   Procedure: CARDIOVERSION;  Surgeon: Minna Merritts, MD;  Location: [facility];  Service: Cardiovascular;  Laterality: N/A;   CARDIOVERSION N/A 11/01/2020   Procedure: CARDIOVERSION;  Surgeon: Kate Sable, MD;  Location: [facility];  Service: Cardiovascular;  Laterality: N/A;   CARDIOVERSION N/A 11/23/2020   Procedure: CARDIOVERSION;  Surgeon: Minna Merritts, MD;  Location: [facility];  Service: Cardiovascular;  Laterality: N/A;   TEE WITHOUT CARDIOVERSION N/A 09/25/2020   Procedure: TRANSESOPHAGEAL ECHOCARDIOGRAM (TEE);  Surgeon: Wellington Hampshire, MD;  Location: [facility];  Service: Cardiovascular;  Laterality: N/A;   TEE WITHOUT CARDIOVERSION N/A 10/26/2020   Procedure: TRANSESOPHAGEAL ECHOCARDIOGRAM (TEE);  Surgeon: Minna Merritts, MD;  Location: [facility];  Service: Cardiovascular;  Laterality: N/A;    Current Outpatient Medications  Medication Sig Dispense Refill   albuterol (VENTOLIN HFA) 108 (90 Base) MCG/ACT inhaler Inhale 2  puffs into the lungs every 4 (four) hours as needed.     amiodarone (PACERONE) 200 MG tablet Take 1 tablet (200 mg total) by mouth daily. PLEASE SCHEDULE F/U APPOINTMENT WITH DR. Lalla Brothers FOR FURTHER REFILLS. THANK YOU! 30 tablet 0   apixaban (ELIQUIS) 5 MG TABS tablet Take 1 tablet (5 mg total) by mouth 2 (two) times daily. 180 tablet 1   ascorbic acid (VITAMIN C) 500 MG tablet Take 1,000 mg by mouth 2 (two) times daily.     atorvastatin (LIPITOR) 20 MG tablet Take 20 mg by mouth daily.     budesonide-formoterol (SYMBICORT) 160-4.5 MCG/ACT inhaler Inhale 1 puff into the lungs 2 (two) times daily in the am and at bedtime.Marland Kitchen     CARDIZEM 60 MG tablet Take by mouth daily.     FLUoxetine (PROZAC) 40 MG capsule Take 40 mg by mouth daily.     furosemide (LASIX) 40 MG tablet Take 40 mg by mouth daily.     metoprolol tartrate (LOPRESSOR) 50 MG tablet Take 50 mg by mouth 2 (two) times daily.     Multiple Vitamins-Minerals (ONE-A-DAY WOMENS 50+ PO) Take 1 tablet by mouth daily.     pantoprazole (PROTONIX) 40 MG tablet Take 40 mg by mouth daily.     tiotropium (SPIRIVA) 18 MCG inhalation capsule Place 18 mcg into inhaler and inhale daily.     vitamin E 180 MG (400 UNITS) capsule Take 400 Units by mouth 2 (two) times daily.     nicotine (NICODERM CQ - DOSED IN MG/24 HOURS) 21 mg/24hr patch Place 1 patch (21 mg total) onto the skin daily. (Patient not taking: Reported on 10/23/2021) 28 patch 0   No current facility-administered medications for this visit.    Allergies:   Codeine   Social History:  The patient  reports that she quit smoking about 6 months ago. Her smoking use included cigarettes. She has a 24.00 pack-year smoking history. She has never used smokeless tobacco. She reports that she does not currently use alcohol. She reports that she does not use drugs.   Family History:   family history includes COPD in her mother; Heart disease in her sister; Heart failure in her father.    Review of  Systems: Review of Systems  Constitutional: Negative.   HENT: Negative.    Respiratory:  Positive for shortness of breath.   Cardiovascular:  Positive for palpitations.  Gastrointestinal: Negative.   Musculoskeletal: Negative.   Neurological: Negative.   Psychiatric/Behavioral: Negative.    All other systems reviewed and are negative.    PHYSICAL EXAM: VS:  BP 100/60 (BP Location: Left Arm, Patient Position: Sitting, Cuff Size: Normal)   Pulse 84   Ht 5\' 4"  (1.626 m)   Wt 122 lb (55.3 kg)   SpO2 95%   BMI 20.94 kg/m  , BMI Body mass index is 20.94 kg/m. Constitutional:  oriented to person, place, and time. No distress.  HENT:  Head: Grossly normal Eyes:  no discharge. No scleral icterus.  Neck: No JVD, no carotid bruits  Cardiovascular: Irregularly irregular, no murmurs appreciated Pulmonary/Chest: Clear to auscultation bilaterally, no wheezes or rails Abdominal: Soft.  no distension.  no tenderness.  Musculoskeletal: Normal range of motion Neurological:  normal muscle tone. Coordination normal. No atrophy Skin: Skin warm and dry Psychiatric: normal affect, pleasant  Recent Labs:  11/16/2020: ALT 125 12/25/2020: TSH 8.950 06/24/2021: B Natriuretic Peptide 965.1 06/27/2021: BUN 28; Creatinine, Ser 0.92; Hemoglobin 13.9; Magnesium 2.2; Platelets 332; Potassium 4.0; Sodium 139    Lipid Panel Lab Results  Component Value Date   CHOL 161 10/13/2020   HDL 54 10/13/2020   LDLCALC 93 10/13/2020   TRIG 68 10/13/2020      Wt Readings from Last 3 Encounters:  10/23/21 122 lb (55.3 kg)  06/27/21 111 lb 1.8 oz (50.4 kg)  02/21/21 109 lb (49.4 kg)     ASSESSMENT AND PLAN:  Problem List Items Addressed This Visit       Cardiology Problems   Essential hypertension   Relevant Medications   atorvastatin (LIPITOR) 20 MG tablet   metoprolol tartrate (LOPRESSOR) 50 MG tablet   CARDIZEM 60 MG tablet   furosemide (LASIX) 40 MG tablet   Hyperlipidemia   Relevant  Medications   atorvastatin (LIPITOR) 20 MG tablet   metoprolol tartrate (LOPRESSOR) 50 MG tablet   CARDIZEM 60 MG tablet   furosemide (LASIX) 40 MG tablet   Atrial fibrillation with RVR (HCC) - Primary   Relevant Medications   atorvastatin (LIPITOR) 20 MG tablet   metoprolol tartrate (LOPRESSOR) 50 MG tablet   CARDIZEM 60 MG tablet   furosemide (LASIX) 40 MG tablet   Chronic diastolic CHF (congestive heart failure) (HCC)   Relevant Medications   atorvastatin (LIPITOR) 20 MG tablet   metoprolol tartrate (LOPRESSOR) 50 MG tablet   CARDIZEM 60 MG tablet   furosemide (LASIX) 40 MG tablet     Other   COPD (chronic obstructive pulmonary disease) (HCC)   Relevant Medications   tiotropium (SPIRIVA) 18 MCG inhalation capsule   Other Visit Diagnoses     Chronic systolic heart failure (HCC)       Relevant Medications   atorvastatin (LIPITOR) 20 MG tablet   metoprolol tartrate (LOPRESSOR) 50 MG tablet   CARDIZEM 60 MG tablet   furosemide (LASIX) 40 MG tablet     Atrial fibrillation, persistent Previously seen by EP, she is declining pacer and AV node ablation Recommend she stop the amiodarone as she is no desire for procedures That she is relatively asymptomatic Amiodarone likely not a good medication for her given underlying COPD Blood pressure low, we will not start the diltiazem SR 60 as prescribed by primary care Continue metoprolol tartrate 50 twice daily for rate control, continue Eliquis 5 twice daily Appears euvolemic, rate reasonable in the 80s on today's visit home  Chronic diastolic CHF Continue Lasix 40 daily Several hospital admissions for acute on chronic diastolic CHF in the setting of atrial fibrillation Appears euvolemic on today's visit, stable potassium Ejection fraction 60%   Total encounter time more than 30 minutes  Greater than 50% was spent in counseling and coordination of care with the patient    Signed, Dossie Arbour, M.D., Ph.D. [facility] [place], [place] [phone]

## 2021-10-23 ENCOUNTER — Encounter: Payer: Self-pay | Admitting: Cardiovascular Disease

## 2021-10-23 ENCOUNTER — Ambulatory Visit: Payer: Medicare HMO | Attending: Cardiovascular Disease | Admitting: Cardiovascular Disease

## 2021-10-23 VITALS — BP 100/60 | HR 84 | Ht 64.0 in | Wt 122.0 lb

## 2021-10-23 DIAGNOSIS — I10 Essential (primary) hypertension: Secondary | ICD-10-CM | POA: Diagnosis not present

## 2021-10-23 DIAGNOSIS — I5022 Chronic systolic (congestive) heart failure: Secondary | ICD-10-CM | POA: Diagnosis not present

## 2021-10-23 DIAGNOSIS — I4891 Unspecified atrial fibrillation: Secondary | ICD-10-CM | POA: Diagnosis not present

## 2021-10-23 DIAGNOSIS — J449 Chronic obstructive pulmonary disease, unspecified: Secondary | ICD-10-CM

## 2021-10-23 DIAGNOSIS — I5032 Chronic diastolic (congestive) heart failure: Secondary | ICD-10-CM | POA: Diagnosis not present

## 2021-10-23 DIAGNOSIS — E782 Mixed hyperlipidemia: Secondary | ICD-10-CM

## 2021-10-23 NOTE — Patient Instructions (Addendum)
Medication Instructions:  Please stop the amiodarone Do not take the diltiazem  If you need a refill on your cardiac medications before your next appointment, please call your pharmacy.    Lab work: No new labs needed   Testing/Procedures: No new testing needed   Follow-Up: At [facility], you and your health needs are our priority.  As part of our continuing mission to provide you with exceptional heart care, we have created designated Provider Care Teams.  These Care Teams include your primary Cardiologist (physician) and Advanced Practice Providers (APPs -  Physician Assistants and Nurse Practitioners) who all work together to provide you with the care you need, when you need it.  You will need a follow up appointment in 6 months  Providers on your designated Care Team:   Murray Hodgkins, NP Christell Faith, PA-C Cadence Kathlen Mody, [place]  COVID-19 Vaccine Information can be found at: [email or web address] For questions related to vaccine distribution or appointments, please email [email or web address] or call [phone].

## 2021-10-25 ENCOUNTER — Ambulatory Visit
Admission: RE | Admit: 2021-10-25 | Discharge: 2021-10-25 | Disposition: A | Discharge status: Home / Self Care | Payer: Medicare HMO | Source: Ambulatory Visit | Attending: Family Medicine | Admitting: Family Medicine

## 2021-10-25 DIAGNOSIS — Z1231 Encounter for screening mammogram for malignant neoplasm of breast: Secondary | ICD-10-CM | POA: Diagnosis present

## 2021-10-26 ENCOUNTER — Other Ambulatory Visit: Payer: Self-pay | Admitting: *Deleted

## 2021-10-26 ENCOUNTER — Inpatient Hospital Stay
Admission: RE | Admit: 2021-10-26 | Discharge: 2021-10-26 | Disposition: A | Discharge status: Home / Self Care | Payer: Self-pay | Source: Ambulatory Visit | Attending: *Deleted | Admitting: *Deleted

## 2021-10-26 DIAGNOSIS — Z1231 Encounter for screening mammogram for malignant neoplasm of breast: Secondary | ICD-10-CM

## 2022-02-08 ENCOUNTER — Other Ambulatory Visit: Payer: Self-pay | Admitting: Cardiovascular Disease

## 2022-04-16 ENCOUNTER — Encounter: Payer: Self-pay | Admitting: *Deleted

## 2022-04-22 NOTE — Progress Notes (Unsigned)
Cardiology Office Note  Date:  04/23/2022   ID:  Angela Singh, DOB 11-25-1950, MRN [id number]  PCP:  Letta Median, MD   Chief Complaint  Patient presents with   6 month follow up     Patient c/o shortness of breath with exertion. Medications reviewed by the patient verbally.     HPI:  72 year-old female with history of  Persistent atrial fibrillation with RVR hypertension,  hyperlipidemia,  tobacco abuse, COPD, quit 2023 GERD,  who presents for follow-up related to recurrent atrial fibrillation and heart failure with midrange ejection fraction (EF 45-50% September 2022), EF 60 to 65% May 2023, COPD  Last seen in clinic by myself September 2023 At that time rate was relatively well-controlled with metoprolol tartrate 50 twice daily  In follow-up today, Reports having dizziness spells, syncope, 1 month ago  Stopped her metoprolol 50 Sx better  Appreciating shortness of breath on exertion  EKG today reviewed personally by myself showing atrial fibrillation rate 146 bpm, consider septal MI, nonspecific ST abnormality possibly rate related Reports she is asymptomatic at rest  Other past echo history reviewed Seen by EP January 2023 At the time it was recommended she have pacemaker followed by AV node ablation She canceled the procedure, on further discussion today reports that she did not want any procedures She reports that she currently feels well, feels asymptomatic from her atrial fibrillation Currently for rate control she is taking amiodarone, metoprolol tartrate 50 twice daily Diltiazem SR 60 was written by primary care but she has not started this yet  Prior history of difficult to control atrial fibrillation secondary to RVR, multiple hospitalizations, Also admissions for diastolic CHF in the setting of atrial fibrillation  August 2022, in the hospital A. Fib RVR.  controlled on IV diltiazem, IV amiodarone, IV digoxin, and oral beta-blocker.  Echo showed an  EF of 60 to 65% with mild to moderate mitral regurgitation.  TEE on August 22 left atrial appendage thrombus.   September 7 showed a digoxin level of 2.7  sent to the hospital  in A. Fib with a slow response in the 50s. hypotensive.  Hypokalemic  October 23 2020, she complained of fatigue.  in A. Fib with RVR.   TEE and cardioversion,  cardioversion was successful.   September 25 with recurrent A. Fib with RVR and heart failure.  In the hospital cardioversion on September 28 reverted back to atrial fibrillation by September 29.    cardioversion November 23, 2020 Normal sinus rhythm restored, Follow-up in the hospital November 25, 2020, back in atrial fibrillation  PMH:   has a past medical history of Chronic combined systolic (congestive) and diastolic (congestive) heart failure ([place]), COPD (chronic obstructive pulmonary disease) ([place]), Essential hypertension, GERD (gastroesophageal reflux disease), Mitral regurgitation, Mixed hyperlipidemia, Persistent atrial fibrillation ([place]), Thrombus of left atrial appendage, Tobacco abuse, and Tricuspid regurgitation.  PSH:    Past Surgical History:  Procedure Laterality Date   CARDIOVERSION N/A 10/26/2020   Procedure: CARDIOVERSION;  Surgeon: Minna Merritts, MD;  Location: [facility];  Service: Cardiovascular;  Laterality: N/A;   CARDIOVERSION N/A 11/01/2020   Procedure: CARDIOVERSION;  Surgeon: Kate Sable, MD;  Location: [facility];  Service: Cardiovascular;  Laterality: N/A;   CARDIOVERSION N/A 11/23/2020   Procedure: CARDIOVERSION;  Surgeon: Minna Merritts, MD;  Location: [facility];  Service: Cardiovascular;  Laterality: N/A;   TEE WITHOUT CARDIOVERSION N/A 09/25/2020   Procedure: TRANSESOPHAGEAL ECHOCARDIOGRAM (TEE);  Surgeon: Wellington Hampshire, MD;  Location: [facility];  Service: Cardiovascular;  Laterality: N/A;   TEE WITHOUT CARDIOVERSION N/A 10/26/2020   Procedure: TRANSESOPHAGEAL ECHOCARDIOGRAM (TEE);  Surgeon: Minna Merritts,  MD;  Location: [facility];  Service: Cardiovascular;  Laterality: N/A;    Current Outpatient Medications  Medication Sig Dispense Refill   apixaban (ELIQUIS) 5 MG TABS tablet Take 1 tablet (5 mg total) by mouth 2 (two) times daily. 180 tablet 1   ascorbic acid (VITAMIN C) 500 MG tablet Take 1,000 mg by mouth 2 (two) times daily.     atorvastatin (LIPITOR) 20 MG tablet Take 20 mg by mouth daily.     budesonide-formoterol (SYMBICORT) 160-4.5 MCG/ACT inhaler Inhale 1 puff into the lungs 2 (two) times daily in the am and at bedtime.Marland Kitchen     FLUoxetine (PROZAC) 40 MG capsule Take 40 mg by mouth daily.     furosemide (LASIX) 40 MG tablet Take 1 tablet (40 mg total) by mouth daily. 90 tablet 0   Multiple Vitamins-Minerals (ONE-A-DAY WOMENS 50+ PO) Take 1 tablet by mouth daily.     pantoprazole (PROTONIX) 40 MG tablet Take 40 mg by mouth daily.     tiotropium (SPIRIVA) 18 MCG inhalation capsule Place 18 mcg into inhaler and inhale daily.     vitamin E 180 MG (400 UNITS) capsule Take 400 Units by mouth 2 (two) times daily.     albuterol (VENTOLIN HFA) 108 (90 Base) MCG/ACT inhaler Inhale 2 puffs into the lungs every 4 (four) hours as needed. (Patient not taking: Reported on 04/23/2022)     nicotine (NICODERM CQ - DOSED IN MG/24 HOURS) 21 mg/24hr patch Place 1 patch (21 mg total) onto the skin daily. (Patient not taking: Reported on 10/23/2021) 28 patch 0   No current facility-administered medications for this visit.    Allergies:   Codeine   Social History:  The patient  reports that she quit smoking about a year ago. Her smoking use included cigarettes. She has a 24.00 pack-year smoking history. She has never used smokeless tobacco. She reports that she does not currently use alcohol. She reports that she does not use drugs.   Family History:   family history includes COPD in her mother; Heart disease in her sister; Heart failure in her father.    Review of Systems: Review of Systems  Constitutional:  Negative.   HENT: Negative.    Respiratory:  Positive for shortness of breath.   Cardiovascular:  Positive for palpitations.  Gastrointestinal: Negative.   Musculoskeletal: Negative.   Neurological: Negative.   Psychiatric/Behavioral: Negative.    All other systems reviewed and are negative.   PHYSICAL EXAM: VS:  BP 110/60 (BP Location: Left Arm, Patient Position: Sitting, Cuff Size: Normal)   Pulse (!) 146   Ht 5\' 4"  (1.626 m)   Wt 133 lb 8 oz (60.6 kg)   SpO2 96%   BMI 22.92 kg/m  , BMI Body mass index is 22.92 kg/m. Constitutional:  oriented to person, place, and time. No distress.  HENT:  Head: Grossly normal Eyes:  no discharge. No scleral icterus.  Neck: No JVD, no carotid bruits  Cardiovascular: Rapid, irregularly irregular no murmurs appreciated Pulmonary/Chest: Clear to auscultation bilaterally, no wheezes or rails Abdominal: Soft.  no distension.  no tenderness.  Musculoskeletal: Normal range of motion Neurological:  normal muscle tone. Coordination normal. No atrophy Skin: Skin warm and dry, ecchymotic bruising on arms Psychiatric: normal affect, pleasant  Recent Labs: 06/24/2021: B Natriuretic Peptide 965.1 06/27/2021: BUN 28; Creatinine,  Ser 0.92; Hemoglobin 13.9; Magnesium 2.2; Platelets 332; Potassium 4.0; Sodium 139    Lipid Panel Lab Results  Component Value Date   CHOL 161 10/13/2020   HDL 54 10/13/2020   LDLCALC 93 10/13/2020   TRIG 68 10/13/2020      Wt Readings from Last 3 Encounters:  04/23/22 133 lb 8 oz (60.6 kg)  10/23/21 122 lb (55.3 kg)  06/27/21 111 lb 1.8 oz (50.4 kg)     ASSESSMENT AND PLAN:  Problem List Items Addressed This Visit       Cardiology Problems   Essential hypertension   Hyperlipidemia   Atrial fibrillation with RVR (HCC) - Primary   Chronic diastolic CHF (congestive heart failure) (HCC)     Other   COPD (chronic obstructive pulmonary disease) (HCC)  Atrial fibrillation, persistent Previously seen by EP, she  has previously declined pacer and AV node ablation This was again discussed with her today, she is declined Markedly elevated rate off metoprolol and amiodarone Recommend she restart amiodarone 200 twice daily, Restart metoprolol at lower dose 25 twice daily She stopped the 50 mg dose out of concern this contributed to dizziness/syncope last month Recommend she stay on her Eliquis despite bruising  Chronic diastolic CHF Continue Lasix 40 daily Despite her elevated rate, appears relatively euvolemic   Total encounter time more than 30 minutes  Greater than 50% was spent in counseling and coordination of care with the patient   Signed, Esmond Plants, M.D., Ph.D. [place], [place]

## 2022-04-23 ENCOUNTER — Ambulatory Visit: Payer: Medicare HMO | Attending: Cardiovascular Disease | Admitting: Cardiovascular Disease

## 2022-04-23 ENCOUNTER — Encounter: Payer: Self-pay | Admitting: Cardiovascular Disease

## 2022-04-23 VITALS — BP 110/60 | HR 92 | Ht 64.0 in | Wt 133.5 lb

## 2022-04-23 DIAGNOSIS — E782 Mixed hyperlipidemia: Secondary | ICD-10-CM

## 2022-04-23 DIAGNOSIS — J449 Chronic obstructive pulmonary disease, unspecified: Secondary | ICD-10-CM

## 2022-04-23 DIAGNOSIS — I4891 Unspecified atrial fibrillation: Secondary | ICD-10-CM

## 2022-04-23 DIAGNOSIS — I5032 Chronic diastolic (congestive) heart failure: Secondary | ICD-10-CM | POA: Diagnosis not present

## 2022-04-23 DIAGNOSIS — I10 Essential (primary) hypertension: Secondary | ICD-10-CM

## 2022-04-23 IMAGING — DX DG CHEST 1V PORT
1 series · 1 of 1 positions shown · non-contrast
Comparison: 10/29/2020

CLINICAL DATA: Shortness of breath

EXAM:
PORTABLE CHEST 1 VIEW

[chest ap]
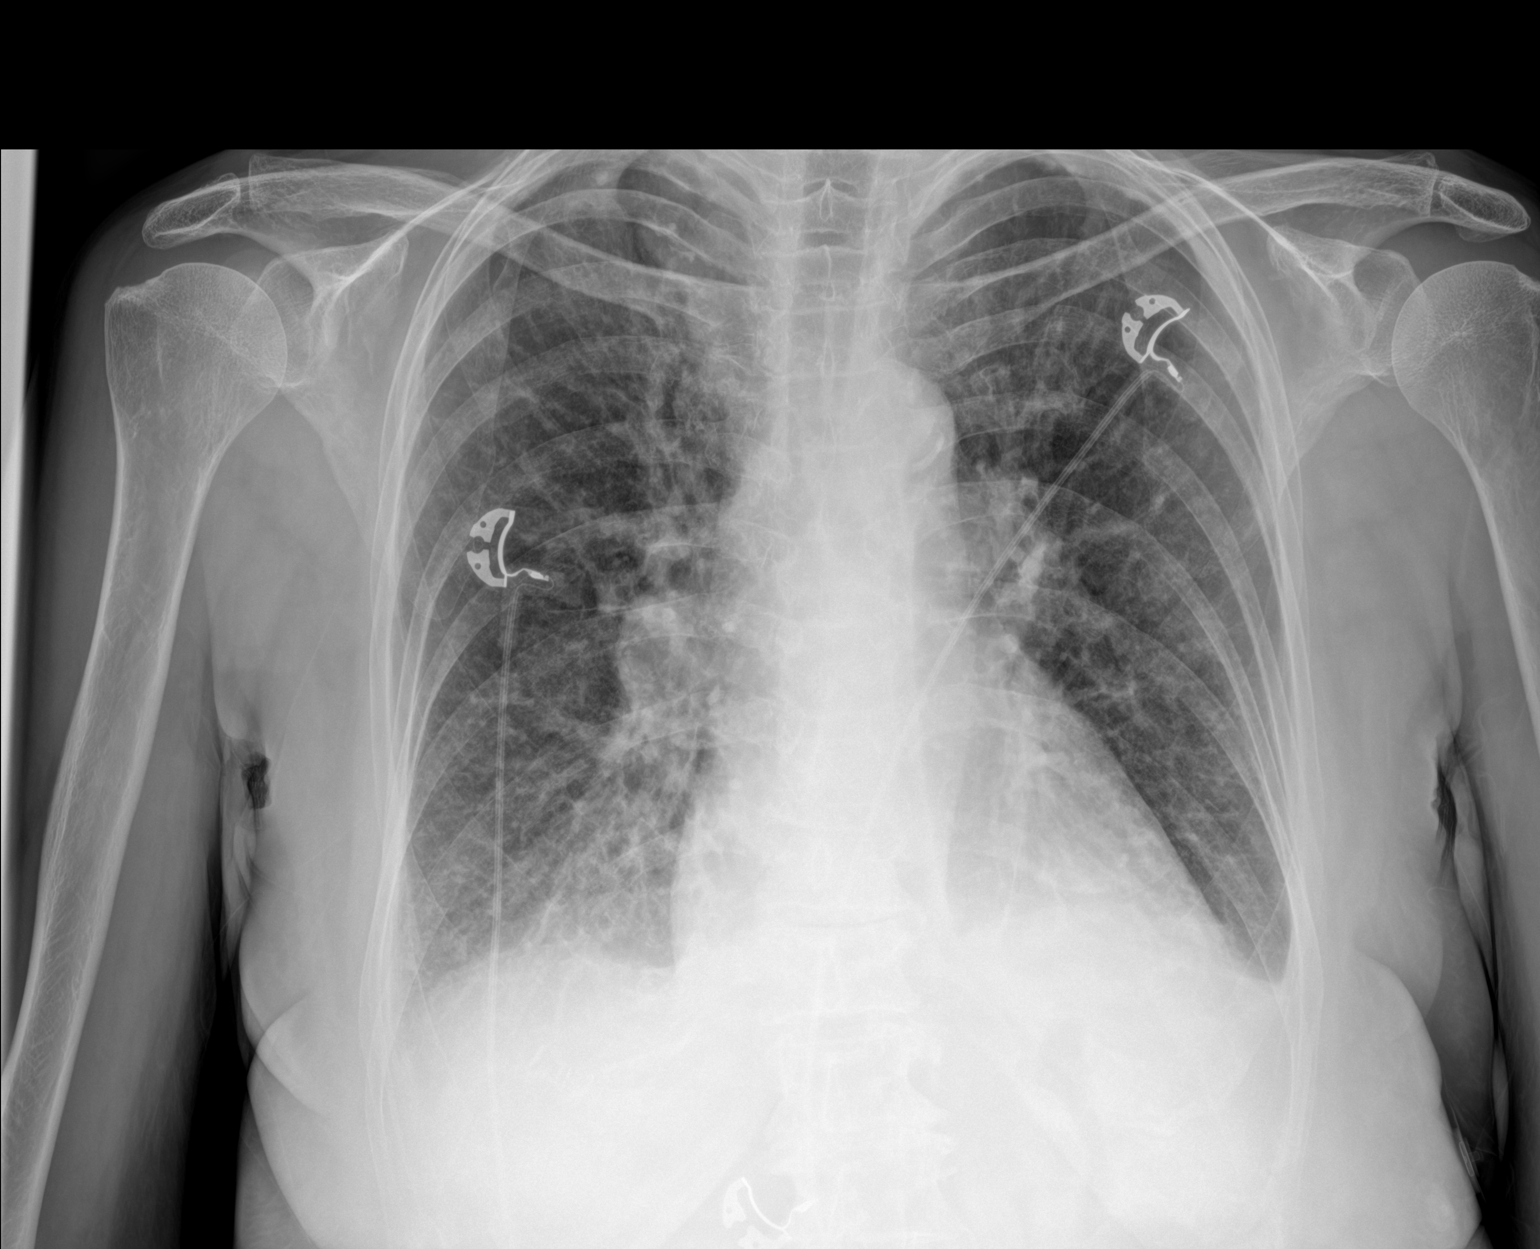

[1 of 1 positions shown; findings below may reference images not displayed]

FINDINGS: Heart is upper limits normal in size. Diffuse interstitial
prominence throughout the lungs with mild vascular congestion.
Suspect small bilateral effusions. Bibasilar atelectasis.
IMPRESSION: Diffuse interstitial prominence could reflect interstitial edema or
chronic lung disease.

Small bilateral effusions with bibasilar atelectasis.

## 2022-04-23 MED ORDER — METOPROLOL TARTRATE 25 MG PO TABS: 25.0000 mg | ORAL_TABLET | Freq: Two times a day (BID) | ORAL | 3 refills | Status: DC

## 2022-04-23 MED ORDER — AMIODARONE HCL 200 MG PO TABS: 200.0000 mg | ORAL_TABLET | Freq: Two times a day (BID) | ORAL | 3 refills | Status: DC

## 2022-04-23 NOTE — Patient Instructions (Addendum)
Medication Instructions:  Please start metoprolol 25 mg twice a day Start amiodarone 200 mg twice a day  If you need a refill on your cardiac medications before your next appointment, please call your pharmacy.   Lab work: No new labs needed  Testing/Procedures: No new testing needed  Follow-Up: At [facility], you and your health needs are our priority.  As part of our continuing mission to provide you with exceptional heart care, we have created designated Provider Care Teams.  These Care Teams include your primary Cardiologist (physician) and Advanced Practice Providers (APPs -  Physician Assistants and Nurse Practitioners) who all work together to provide you with the care you need, when you need it.  You will need a follow up appointment in 3 months  Providers on your designated Care Team:   Murray Hodgkins, NP Christell Faith, PA-C Cadence Kathlen Mody, [place]  COVID-19 Vaccine Information can be found at: [email or web address] For questions related to vaccine distribution or appointments, please email [email or web address] or call [phone].

## 2022-05-02 ENCOUNTER — Other Ambulatory Visit: Payer: Self-pay | Admitting: Cardiovascular Disease

## 2022-08-05 NOTE — Progress Notes (Unsigned)
Cardiology Office Note  Date:  08/06/2022   ID:  Angela Singh, DOB [birth year], MRN [id number]  PCP:  Oswaldo Conroy, MD   Chief Complaint  Patient presents with   3 month follow up     "Doing well." Medications reviewed by the patient verbally.     HPI:  72 year-old female with history of  Persistent atrial fibrillation with RVR hypertension,  hyperlipidemia,  tobacco abuse, COPD, quit 2023 GERD,  who presents for follow-up related to recurrent atrial fibrillation and heart failure with midrange ejection fraction (EF 45-50% September 2022), EF 60 to 65% May 2023, COPD  Last seen in clinic by myself 3/24 On visit September 2023 had rate controlled A-fib metoprolol tartrate 50 twice daily Stopped the metoprolol secondary to dizziness, syncope On visit April 23, 2022 was in atrial fibrillation rate 146 bpm Restarted on amiodarone 200 twice daily metoprolol tartrate 25 twice daily On visit today reports she is on amiodarone, she is not on metoprolol unclear reasons  Denies significant shortness of breath, no fluid retention on Lasix 40 daily Gait instability Previously seen by EP January 2023, offered AV node ablation with pacer At the time was not particularly interested in having the procedure  EKG personally reviewed by myself on todays visit EKG Interpretation Date/Time:  Tuesday August 06 2022 13:56:48 EDT Ventricular Rate:  107 PR Interval:    QRS Duration:  78 QT Interval:  370 QTC Calculation: 493 R Axis:   13  Text Interpretation: Atrial fibrillation with rapid ventricular response Nonspecific ST and T wave abnormality When compared with ECG of 24-Jun-2021 12:11, PREVIOUS ECG IS PRESENT Confirmed by Julien Nordmann [phone]) on 08/06/2022 2:17:15 PM    Other past echo history reviewed Seen by EP January 2023 At the time it was recommended she have pacemaker followed by AV node ablation She canceled the procedure, on further discussion today reports that she did  not want any procedures She reports that she currently feels well, feels asymptomatic from her atrial fibrillation Currently for rate control she is taking amiodarone, metoprolol tartrate 50 twice daily Diltiazem SR 60 was written by primary care but she has not started this yet  Prior history of difficult to control atrial fibrillation secondary to RVR, multiple hospitalizations, Also admissions for diastolic CHF in the setting of atrial fibrillation  August 2022, in the hospital A. Fib RVR.  controlled on IV diltiazem, IV amiodarone, IV digoxin, and oral beta-blocker.  Echo showed an EF of 60 to 65% with mild to moderate mitral regurgitation.  TEE on August 22 left atrial appendage thrombus.   September 7 showed a digoxin level of 2.7  sent to the hospital  in A. Fib with a slow response in the 50s. hypotensive.  Hypokalemic  October 23 2020, she complained of fatigue.  in A. Fib with RVR.   TEE and cardioversion,  cardioversion was successful.   September 25 with recurrent A. Fib with RVR and heart failure.  In the hospital cardioversion on September 28 reverted back to atrial fibrillation by September 29.    cardioversion November 23, 2020 Normal sinus rhythm restored, Follow-up in the hospital November 25, 2020, back in atrial fibrillation  PMH:   has a past medical history of Chronic combined systolic (congestive) and diastolic (congestive) heart failure (HCC), COPD (chronic obstructive pulmonary disease) (HCC), Essential hypertension, GERD (gastroesophageal reflux disease), Mitral regurgitation, Mixed hyperlipidemia, Persistent atrial fibrillation (HCC), Thrombus of left atrial appendage, Tobacco abuse, and Tricuspid regurgitation.  PSH:  Past Surgical History:  Procedure Laterality Date   CARDIOVERSION N/A 10/26/2020   Procedure: CARDIOVERSION;  Surgeon: Antonieta Iba, MD;  Location: [facility];  Service: Cardiovascular;  Laterality: N/A;   CARDIOVERSION N/A 11/01/2020    Procedure: CARDIOVERSION;  Surgeon: Debbe Odea, MD;  Location: [facility];  Service: Cardiovascular;  Laterality: N/A;   CARDIOVERSION N/A 11/23/2020   Procedure: CARDIOVERSION;  Surgeon: Antonieta Iba, MD;  Location: [facility];  Service: Cardiovascular;  Laterality: N/A;   TEE WITHOUT CARDIOVERSION N/A 09/25/2020   Procedure: TRANSESOPHAGEAL ECHOCARDIOGRAM (TEE);  Surgeon: Iran Ouch, MD;  Location: [facility];  Service: Cardiovascular;  Laterality: N/A;   TEE WITHOUT CARDIOVERSION N/A 10/26/2020   Procedure: TRANSESOPHAGEAL ECHOCARDIOGRAM (TEE);  Surgeon: Antonieta Iba, MD;  Location: [facility];  Service: Cardiovascular;  Laterality: N/A;    Current Outpatient Medications  Medication Sig Dispense Refill   albuterol (VENTOLIN HFA) 108 (90 Base) MCG/ACT inhaler Inhale 2 puffs into the lungs every 4 (four) hours as needed.     amiodarone (PACERONE) 200 MG tablet Take 1 tablet (200 mg total) by mouth 2 (two) times daily. 90 tablet 3   apixaban (ELIQUIS) 5 MG TABS tablet Take 1 tablet (5 mg total) by mouth 2 (two) times daily. 180 tablet 1   ascorbic acid (VITAMIN C) 500 MG tablet Take 1,000 mg by mouth 2 (two) times daily.     atorvastatin (LIPITOR) 20 MG tablet Take 20 mg by mouth daily.     budesonide-formoterol (SYMBICORT) 160-4.5 MCG/ACT inhaler Inhale 1 puff into the lungs 2 (two) times daily in the am and at bedtime.Marland Kitchen     FLUoxetine (PROZAC) 40 MG capsule Take 40 mg by mouth daily.     furosemide (LASIX) 40 MG tablet TAKE 1 TABLET BY MOUTH DAILY. 90 tablet 0   metoprolol tartrate (LOPRESSOR) 25 MG tablet Take 1 tablet (25 mg total) by mouth 2 (two) times daily. 180 tablet 3   Multiple Vitamins-Minerals (ONE-A-DAY WOMENS 50+ PO) Take 1 tablet by mouth daily.     pantoprazole (PROTONIX) 40 MG tablet Take 40 mg by mouth daily.     tiotropium (SPIRIVA) 18 MCG inhalation capsule Place 18 mcg into inhaler and inhale daily.     vitamin E 180 MG (400 UNITS) capsule Take 400 Units by  mouth 2 (two) times daily.     nicotine (NICODERM CQ - DOSED IN MG/24 HOURS) 21 mg/24hr patch Place 1 patch (21 mg total) onto the skin daily. (Patient not taking: Reported on 10/23/2021) 28 patch 0   No current facility-administered medications for this visit.    Allergies:   Codeine   Social History:  The patient  reports that she quit smoking about 15 months ago. Her smoking use included cigarettes. She has a 24.00 pack-year smoking history. She has never used smokeless tobacco. She reports that she does not currently use alcohol. She reports that she does not use drugs.   Family History:   family history includes COPD in her mother; Heart disease in her sister; Heart failure in her father.    Review of Systems: Review of Systems  Constitutional: Negative.   HENT: Negative.    Respiratory:  Positive for shortness of breath.   Cardiovascular:  Positive for palpitations.  Gastrointestinal: Negative.   Musculoskeletal: Negative.   Neurological: Negative.   Psychiatric/Behavioral: Negative.    All other systems reviewed and are negative.   PHYSICAL EXAM: VS:  BP 110/70 (BP Location: Left Arm, Patient Position: Sitting,  Cuff Size: Normal)   Pulse (!) 107   Ht 5\' 4"  (1.626 m)   Wt 131 lb 4 oz (59.5 kg)   SpO2 97%   BMI 22.53 kg/m  , BMI Body mass index is 22.53 kg/m. Constitutional:  oriented to person, place, and time. No distress.  HENT:  Head: Grossly normal Eyes:  no discharge. No scleral icterus.  Neck: No JVD, no carotid bruits  Cardiovascular: Irregularly irregular, no murmurs appreciated Pulmonary/Chest: Clear to auscultation bilaterally, no wheezes or rails Abdominal: Soft.  no distension.  no tenderness.  Musculoskeletal: Normal range of motion Neurological:  normal muscle tone. Coordination normal. No atrophy Skin: Skin warm and dry Psychiatric: normal affect, pleasant   Recent Labs: No results found for requested labs within last 365 days.    Lipid  Panel Lab Results  Component Value Date   CHOL 161 10/13/2020   HDL 54 10/13/2020   LDLCALC 93 10/13/2020   TRIG 68 10/13/2020      Wt Readings from Last 3 Encounters:  08/06/22 131 lb 4 oz (59.5 kg)  04/23/22 133 lb 8 oz (60.6 kg)  10/23/21 122 lb (55.3 kg)     ASSESSMENT AND PLAN:  Problem List Items Addressed This Visit       Cardiology Problems   Essential hypertension   Relevant Orders   EKG 12-Lead (Completed)   Hyperlipidemia   Atrial fibrillation with RVR (HCC) - Primary   Relevant Orders   EKG 12-Lead (Completed)   Chronic diastolic CHF (congestive heart failure) (HCC)     Other   COPD (chronic obstructive pulmonary disease) (HCC)   Other Visit Diagnoses     Chronic systolic heart failure (HCC)       Relevant Orders   EKG 12-Lead (Completed)     Atrial fibrillation, persistent Possibly permanent Previously seen by EP January 2023, she has previously declined pacer and AV node ablation This was again just gust with her Recommend she meet again with the EP to determine if there are other options or if this is her only option Recommend she restart metoprolol tartrate 25 twice daily which she is not taking today Continue amiodarone 200 twice daily If unable to tolerate metoprolol tartrate 25, recommend she cut this in half and take 12.5 twice daily Continue Quist 5 twice daily  Chronic diastolic CHF Continue Lasix 40 daily She will meet up with primary care for routine lab work  Hyperlipidemia Continue Lipitor 20 daily   Total encounter time more than 40 minutes  Greater than 50% was spent in counseling and coordination of care with the patient   Signed, Dossie Arbour, M.D., Ph.D. [facility] [place], [place] [phone]

## 2022-08-06 ENCOUNTER — Encounter: Payer: Self-pay | Admitting: Cardiovascular Disease

## 2022-08-06 ENCOUNTER — Ambulatory Visit: Payer: Medicare HMO | Attending: Cardiovascular Disease | Admitting: Cardiovascular Disease

## 2022-08-06 VITALS — BP 110/70 | HR 107 | Ht 64.0 in | Wt 131.2 lb

## 2022-08-06 DIAGNOSIS — I10 Essential (primary) hypertension: Secondary | ICD-10-CM

## 2022-08-06 DIAGNOSIS — J449 Chronic obstructive pulmonary disease, unspecified: Secondary | ICD-10-CM | POA: Diagnosis not present

## 2022-08-06 DIAGNOSIS — I4891 Unspecified atrial fibrillation: Secondary | ICD-10-CM

## 2022-08-06 DIAGNOSIS — E782 Mixed hyperlipidemia: Secondary | ICD-10-CM

## 2022-08-06 DIAGNOSIS — I5032 Chronic diastolic (congestive) heart failure: Secondary | ICD-10-CM

## 2022-08-06 DIAGNOSIS — I5022 Chronic systolic (congestive) heart failure: Secondary | ICD-10-CM

## 2022-08-06 MED ORDER — METOPROLOL TARTRATE 25 MG PO TABS: 25.0000 mg | ORAL_TABLET | Freq: Two times a day (BID) | ORAL | 3 refills | Status: DC

## 2022-08-06 NOTE — Patient Instructions (Addendum)
   Medication Instructions:  Please restart metoprolol tartrate 25 mg twice a day  If you need a refill on your cardiac medications before your next appointment, please call your pharmacy.   Lab work: No new labs needed  Testing/Procedures: No new testing needed  Follow-Up: At [facility], you and your health needs are our priority.  As part of our continuing mission to provide you with exceptional heart care, we have created designated Provider Care Teams.  These Care Teams include your primary Cardiologist (physician) and Advanced Practice Providers (APPs -  Physician Assistants and Nurse Practitioners) who all work together to provide you with the care you need, when you need it.  You will need a follow up appointment in 6 months  Providers on your designated Care Team:   Nicolasa Ducking, NP Eula Listen, PA-C Cadence Fransico Michael, PA-C  F/u with EP, [place]  COVID-19 Vaccine Information can be found at: PodExchange.nl For questions related to vaccine distribution or appointments, please email [email or web address] or call [phone].

## 2022-08-15 ENCOUNTER — Other Ambulatory Visit: Payer: Self-pay | Admitting: Cardiovascular Disease

## 2022-08-26 ENCOUNTER — Other Ambulatory Visit: Payer: Self-pay

## 2022-08-26 DIAGNOSIS — I4891 Unspecified atrial fibrillation: Secondary | ICD-10-CM

## 2022-08-26 MED ORDER — APIXABAN 5 MG PO TABS: 5.0000 mg | ORAL_TABLET | Freq: Two times a day (BID) | ORAL | 1 refills | Status: AC

## 2022-08-26 NOTE — Telephone Encounter (Signed)
Prescription refill request for Eliquis received. Indication:afib Last office visit:7/24 Scr:0.92 2023 Age: 72 Weight:59.5  kg  Prescription refilled

## 2022-09-11 ENCOUNTER — Ambulatory Visit: Payer: Medicare HMO | Admitting: Cardiology

## 2022-09-27 ENCOUNTER — Ambulatory Visit: Payer: Medicare HMO | Admitting: Cardiology

## 2022-10-30 ENCOUNTER — Ambulatory Visit: Payer: Medicare HMO | Admitting: Cardiology

## 2022-11-13 ENCOUNTER — Other Ambulatory Visit: Payer: Self-pay | Admitting: Family Medicine

## 2022-11-13 DIAGNOSIS — Z1231 Encounter for screening mammogram for malignant neoplasm of breast: Secondary | ICD-10-CM

## 2022-12-06 ENCOUNTER — Other Ambulatory Visit: Payer: Self-pay | Admitting: Cardiovascular Disease

## 2022-12-06 NOTE — Telephone Encounter (Signed)
last visit: 08/06/22 with plan to f/u in 6 months. Next visit: 01/21/2023

## 2022-12-13 IMAGING — DX DG CHEST 1V PORT
1 series · 1 of 1 positions shown · non-contrast
Comparison: Chest radiograph dated 11/25/2020.

CLINICAL DATA: Shortness of breath.

EXAM:
PORTABLE CHEST 1 VIEW

[chest ap]
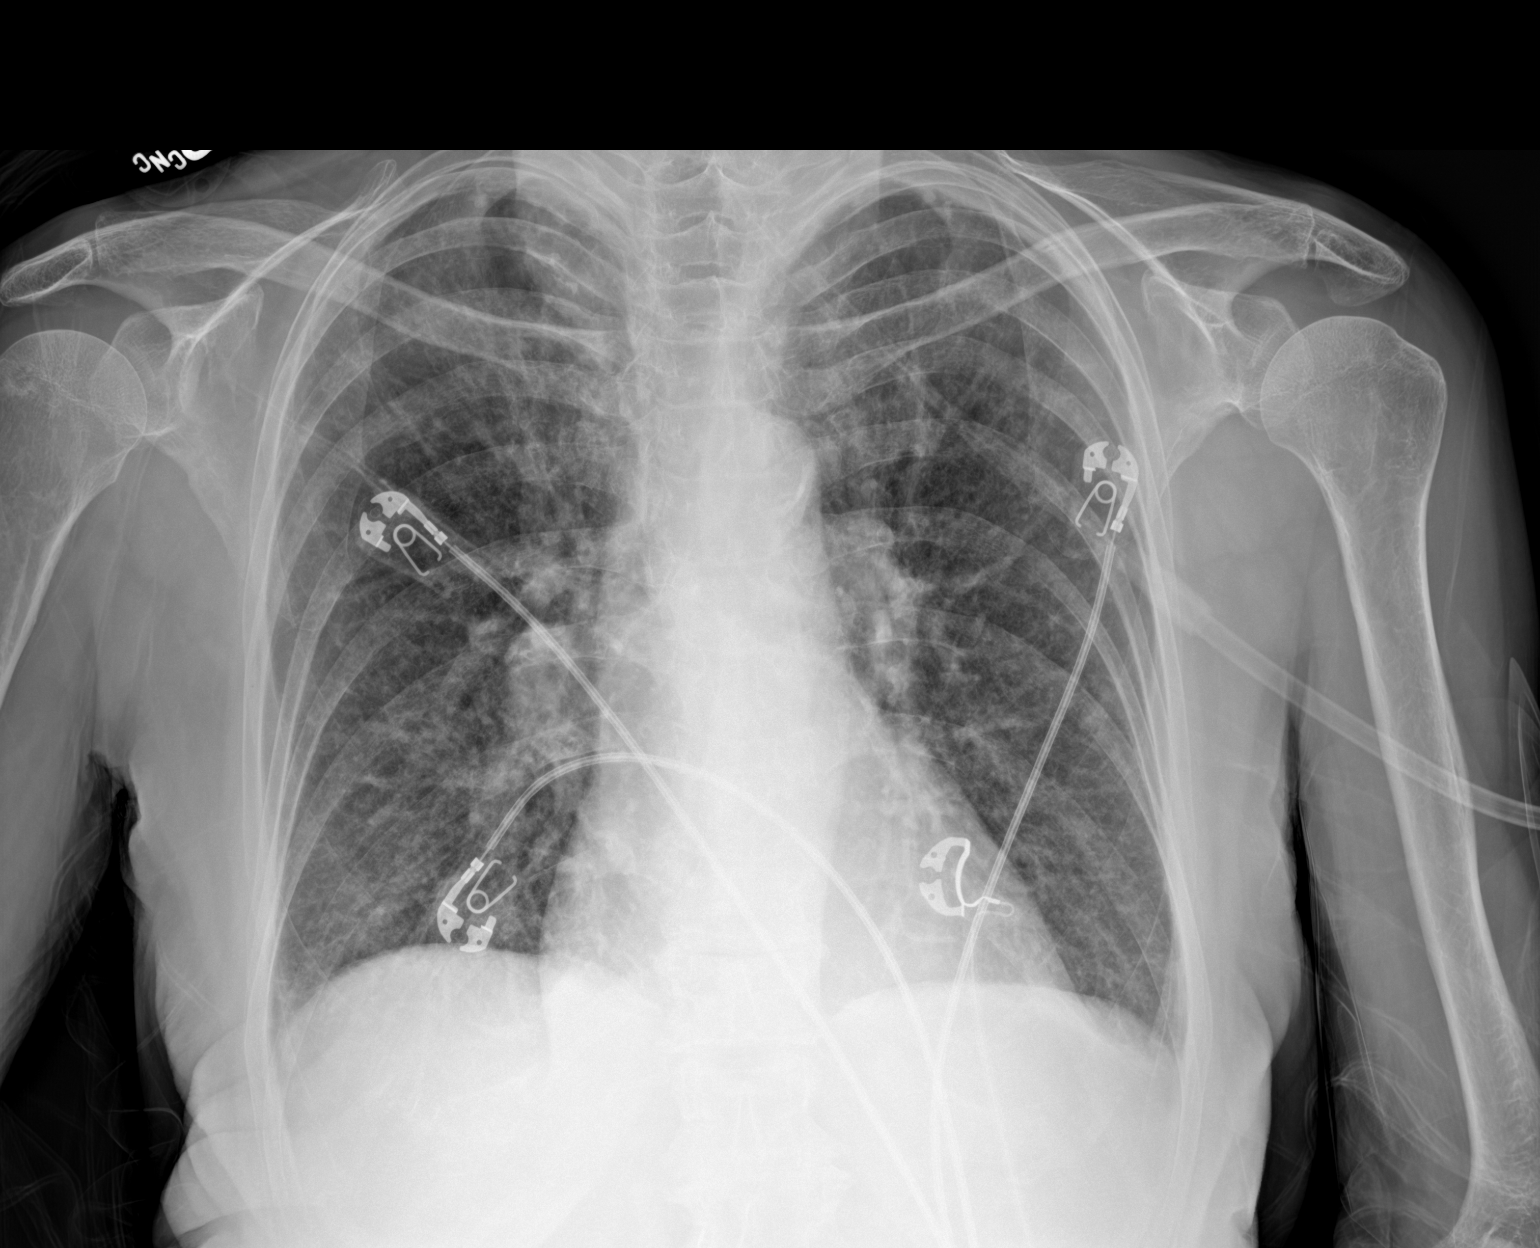

[1 of 1 positions shown; findings below may reference images not displayed]

FINDINGS: The heart size and mediastinal contours are within normal limits.
Vascular calcifications are seen in the aortic arch. Mild diffuse
bilateral interstitial prominence may represent vascular congestion.
No focal consolidation, pleural effusion, or pneumothorax.
Degenerative changes are seen in the spine.
IMPRESSION: Mild diffuse bilateral interstitial prominence may represent
vascular congestion.

Aortic Atherosclerosis ([id number]).

## 2023-01-05 DEATH — deceased

## 2023-01-20 NOTE — Progress Notes (Deleted)
Cardiology Office Note  Date:  01/20/2023   ID:  IMAGIN VADALA, DOB 1950-02-28, MRN [id number]  PCP:  Oswaldo Conroy, MD   No chief complaint on file.   HPI:  72 year-old female with history of  Persistent atrial fibrillation with RVR hypertension,  hyperlipidemia,  tobacco abuse, COPD, quit 2023 GERD,  who presents for follow-up related to recurrent atrial fibrillation and heart failure with midrange ejection fraction (EF 45-50% September 2022), EF 60 to 65% May 2023, COPD  Last seen in clinic by myself 7/24 On visit September 2023 had rate controlled A-fib metoprolol tartrate 50 twice daily Stopped the metoprolol secondary to dizziness, syncope On visit April 23, 2022 was in atrial fibrillation rate 146 bpm Restarted on amiodarone 200 twice daily metoprolol tartrate 25 twice daily On visit today reports she is on amiodarone, she is not on metoprolol unclear reasons  Denies significant shortness of breath, no fluid retention on Lasix 40 daily Gait instability Previously seen by EP January 2023, offered AV node ablation with pacer At the time was not particularly interested in having the procedure  EKG personally reviewed by myself on todays visit      Other past echo history reviewed Seen by EP January 2023 At the time it was recommended she have pacemaker followed by AV node ablation She canceled the procedure, on further discussion today reports that she did not want any procedures She reports that she currently feels well, feels asymptomatic from her atrial fibrillation Currently for rate control she is taking amiodarone, metoprolol tartrate 50 twice daily Diltiazem SR 60 was written by primary care but she has not started this yet  Prior history of difficult to control atrial fibrillation secondary to RVR, multiple hospitalizations, Also admissions for diastolic CHF in the setting of atrial fibrillation  August 2022, in the hospital A. Fib RVR.  controlled  on IV diltiazem, IV amiodarone, IV digoxin, and oral beta-blocker.  Echo showed an EF of 60 to 65% with mild to moderate mitral regurgitation.  TEE on August 22 left atrial appendage thrombus.   September 7 showed a digoxin level of 2.7  sent to the hospital  in A. Fib with a slow response in the 50s. hypotensive.  Hypokalemic  October 23 2020, she complained of fatigue.  in A. Fib with RVR.   TEE and cardioversion,  cardioversion was successful.   September 25 with recurrent A. Fib with RVR and heart failure.  In the hospital cardioversion on September 28 reverted back to atrial fibrillation by September 29.    cardioversion November 23, 2020 Normal sinus rhythm restored, Follow-up in the hospital November 25, 2020, back in atrial fibrillation  PMH:   has a past medical history of Chronic combined systolic (congestive) and diastolic (congestive) heart failure (HCC), COPD (chronic obstructive pulmonary disease) (HCC), Essential hypertension, GERD (gastroesophageal reflux disease), Mitral regurgitation, Mixed hyperlipidemia, Persistent atrial fibrillation (HCC), Thrombus of left atrial appendage, Tobacco abuse, and Tricuspid regurgitation.  PSH:    Past Surgical History:  Procedure Laterality Date   CARDIOVERSION N/A 10/26/2020   Procedure: CARDIOVERSION;  Surgeon: Antonieta Iba, MD;  Location: [facility];  Service: Cardiovascular;  Laterality: N/A;   CARDIOVERSION N/A 11/01/2020   Procedure: CARDIOVERSION;  Surgeon: Debbe Odea, MD;  Location: [facility];  Service: Cardiovascular;  Laterality: N/A;   CARDIOVERSION N/A 11/23/2020   Procedure: CARDIOVERSION;  Surgeon: Antonieta Iba, MD;  Location: [facility];  Service: Cardiovascular;  Laterality: N/A;   TEE WITHOUT CARDIOVERSION  N/A 09/25/2020   Procedure: TRANSESOPHAGEAL ECHOCARDIOGRAM (TEE);  Surgeon: Iran Ouch, MD;  Location: [facility];  Service: Cardiovascular;  Laterality: N/A;   TEE WITHOUT CARDIOVERSION N/A 10/26/2020    Procedure: TRANSESOPHAGEAL ECHOCARDIOGRAM (TEE);  Surgeon: Antonieta Iba, MD;  Location: [facility];  Service: Cardiovascular;  Laterality: N/A;    Current Outpatient Medications  Medication Sig Dispense Refill   albuterol (VENTOLIN HFA) 108 (90 Base) MCG/ACT inhaler Inhale 2 puffs into the lungs every 4 (four) hours as needed.     amiodarone (PACERONE) 200 MG tablet TAKE 1 TABLET BY MOUTH 2 TIMES A DAY. 180 tablet 0   apixaban (ELIQUIS) 5 MG TABS tablet Take 1 tablet (5 mg total) by mouth 2 (two) times daily. 180 tablet 1   ascorbic acid (VITAMIN C) 500 MG tablet Take 1,000 mg by mouth 2 (two) times daily.     atorvastatin (LIPITOR) 20 MG tablet Take 20 mg by mouth daily.     budesonide-formoterol (SYMBICORT) 160-4.5 MCG/ACT inhaler Inhale 1 puff into the lungs 2 (two) times daily in the am and at bedtime.Marland Kitchen     FLUoxetine (PROZAC) 40 MG capsule Take 40 mg by mouth daily.     furosemide (LASIX) 40 MG tablet TAKE 1 TABLET BY MOUTH ONCE A DAY 90 tablet 1   metoprolol tartrate (LOPRESSOR) 25 MG tablet Take 1 tablet (25 mg total) by mouth 2 (two) times daily. 180 tablet 3   Multiple Vitamins-Minerals (ONE-A-DAY WOMENS 50+ PO) Take 1 tablet by mouth daily.     nicotine (NICODERM CQ - DOSED IN MG/24 HOURS) 21 mg/24hr patch Place 1 patch (21 mg total) onto the skin daily. (Patient not taking: Reported on 10/23/2021) 28 patch 0   pantoprazole (PROTONIX) 40 MG tablet Take 40 mg by mouth daily.     tiotropium (SPIRIVA) 18 MCG inhalation capsule Place 18 mcg into inhaler and inhale daily.     vitamin E 180 MG (400 UNITS) capsule Take 400 Units by mouth 2 (two) times daily.     No current facility-administered medications for this visit.    Allergies:   Codeine   Social History:  The patient  reports that she quit smoking about 20 months ago. Her smoking use included cigarettes. She started smoking about 49 years ago. She has a 24 pack-year smoking history. She has never used smokeless tobacco. She  reports that she does not currently use alcohol. She reports that she does not use drugs.   Family History:   family history includes COPD in her mother; Heart disease in her sister; Heart failure in her father.    Review of Systems: Review of Systems  Constitutional: Negative.   HENT: Negative.    Respiratory:  Positive for shortness of breath.   Cardiovascular:  Positive for palpitations.  Gastrointestinal: Negative.   Musculoskeletal: Negative.   Neurological: Negative.   Psychiatric/Behavioral: Negative.    All other systems reviewed and are negative.   PHYSICAL EXAM: VS:  There were no vitals taken for this visit. , BMI There is no height or weight on file to calculate BMI. Constitutional:  oriented to person, place, and time. No distress.  HENT:  Head: Grossly normal Eyes:  no discharge. No scleral icterus.  Neck: No JVD, no carotid bruits  Cardiovascular: Irregularly irregular, no murmurs appreciated Pulmonary/Chest: Clear to auscultation bilaterally, no wheezes or rails Abdominal: Soft.  no distension.  no tenderness.  Musculoskeletal: Normal range of motion Neurological:  normal muscle tone. Coordination normal. No  atrophy Skin: Skin warm and dry Psychiatric: normal affect, pleasant   Recent Labs: No results found for requested labs within last 365 days.    Lipid Panel Lab Results  Component Value Date   CHOL 161 10/13/2020   HDL 54 10/13/2020   LDLCALC 93 10/13/2020   TRIG 68 10/13/2020      Wt Readings from Last 3 Encounters:  08/06/22 131 lb 4 oz (59.5 kg)  04/23/22 133 lb 8 oz (60.6 kg)  10/23/21 122 lb (55.3 kg)     ASSESSMENT AND PLAN:  Problem List Items Addressed This Visit   None  Atrial fibrillation, persistent Possibly permanent Previously seen by EP January 2023, she has previously declined pacer and AV node ablation This was again just gust with her Recommend she meet again with the EP to determine if there are other options or if  this is her only option Recommend she restart metoprolol tartrate 25 twice daily which she is not taking today Continue amiodarone 200 twice daily If unable to tolerate metoprolol tartrate 25, recommend she cut this in half and take 12.5 twice daily Continue Quist 5 twice daily  Chronic diastolic CHF Continue Lasix 40 daily She will meet up with primary care for routine lab work  Hyperlipidemia Continue Lipitor 20 daily   Total encounter time more than 40 minutes  Greater than 50% was spent in counseling and coordination of care with the patient   Signed, Dossie Arbour, M.D., Ph.D. [facility] [place], [place] [phone]

## 2023-01-21 ENCOUNTER — Ambulatory Visit: Payer: Medicare HMO | Attending: Cardiovascular Disease | Admitting: Cardiovascular Disease

## 2023-01-21 DIAGNOSIS — I5032 Chronic diastolic (congestive) heart failure: Secondary | ICD-10-CM

## 2023-01-21 DIAGNOSIS — E782 Mixed hyperlipidemia: Secondary | ICD-10-CM

## 2023-01-21 DIAGNOSIS — J449 Chronic obstructive pulmonary disease, unspecified: Secondary | ICD-10-CM

## 2023-01-21 DIAGNOSIS — I4891 Unspecified atrial fibrillation: Secondary | ICD-10-CM

## 2023-01-21 DIAGNOSIS — I10 Essential (primary) hypertension: Secondary | ICD-10-CM

## 2023-05-12 ENCOUNTER — Other Ambulatory Visit: Payer: Self-pay | Admitting: Cardiology

## 2023-05-12 DIAGNOSIS — I4891 Unspecified atrial fibrillation: Secondary | ICD-10-CM
